# Patient Record
Sex: Male | Born: 1955
Health system: Southern US, Community
[De-identification: ages and names within clinical notes are randomized; demographics above are authoritative.]

## PROBLEM LIST (undated history)

## (undated) DIAGNOSIS — M222X9 Patellofemoral disorders, unspecified knee: Secondary | ICD-10-CM

## (undated) DIAGNOSIS — M1990 Unspecified osteoarthritis, unspecified site: Secondary | ICD-10-CM

## (undated) DIAGNOSIS — G4730 Sleep apnea, unspecified: Secondary | ICD-10-CM

## (undated) DIAGNOSIS — K219 Gastro-esophageal reflux disease without esophagitis: Secondary | ICD-10-CM

## (undated) DIAGNOSIS — Z8619 Personal history of other infectious and parasitic diseases: Secondary | ICD-10-CM

## (undated) DIAGNOSIS — I10 Essential (primary) hypertension: Secondary | ICD-10-CM

## (undated) HISTORY — DX: Essential (primary) hypertension: I10

## (undated) HISTORY — DX: Gastro-esophageal reflux disease without esophagitis: K21.9

## (undated) HISTORY — PX: NASAL SINUS SURGERY: SHX719

## (undated) HISTORY — DX: Patellofemoral disorders, unspecified knee: M22.2X9

## (undated) HISTORY — DX: Personal history of other infectious and parasitic diseases: Z86.19

## (undated) HISTORY — PX: OTHER SURGICAL HISTORY: SHX169

## (undated) HISTORY — DX: Unspecified osteoarthritis, unspecified site: M19.90

## (undated) HISTORY — DX: Sleep apnea, unspecified: G47.30

## (undated) HISTORY — PX: TONSILLECTOMY: SUR1361

---

## 1998-07-27 ENCOUNTER — Ambulatory Visit (HOSPITAL_COMMUNITY): Admission: RE | Admit: 1998-07-27 | Discharge: 1998-07-27 | Payer: Self-pay | Admitting: *Deleted

## 1999-01-07 ENCOUNTER — Encounter: Payer: Self-pay | Admitting: *Deleted

## 1999-01-07 ENCOUNTER — Ambulatory Visit (HOSPITAL_COMMUNITY): Admission: RE | Admit: 1999-01-07 | Discharge: 1999-01-07 | Payer: Self-pay | Admitting: *Deleted

## 1999-04-25 ENCOUNTER — Encounter: Admission: RE | Admit: 1999-04-25 | Discharge: 1999-07-24 | Payer: Self-pay | Admitting: *Deleted

## 2005-09-29 ENCOUNTER — Emergency Department (HOSPITAL_COMMUNITY): Admission: EM | Admit: 2005-09-29 | Discharge: 2005-09-29 | Payer: Self-pay | Admitting: Family Medicine

## 2008-12-14 ENCOUNTER — Ambulatory Visit: Payer: Self-pay | Admitting: Family Medicine

## 2008-12-14 DIAGNOSIS — E119 Type 2 diabetes mellitus without complications: Secondary | ICD-10-CM | POA: Insufficient documentation

## 2008-12-14 DIAGNOSIS — M79609 Pain in unspecified limb: Secondary | ICD-10-CM | POA: Insufficient documentation

## 2008-12-14 DIAGNOSIS — R5381 Other malaise: Secondary | ICD-10-CM

## 2008-12-14 DIAGNOSIS — R5383 Other fatigue: Secondary | ICD-10-CM

## 2008-12-14 DIAGNOSIS — K219 Gastro-esophageal reflux disease without esophagitis: Secondary | ICD-10-CM

## 2008-12-14 DIAGNOSIS — I10 Essential (primary) hypertension: Secondary | ICD-10-CM

## 2008-12-14 LAB — CONVERTED CEMR LAB
ALT: 29 units/L (ref 0–53)
AST: 24 units/L (ref 0–37)
Albumin: 4.2 g/dL (ref 3.5–5.2)
Alkaline Phosphatase: 66 units/L (ref 39–117)
BUN: 19 mg/dL (ref 6–23)
Basophils Absolute: 0 10*3/uL (ref 0.0–0.1)
Basophils Relative: 0.4 % (ref 0.0–3.0)
Bilirubin, Direct: 0.1 mg/dL (ref 0.0–0.3)
CO2: 28 meq/L (ref 19–32)
Calcium: 10.2 mg/dL (ref 8.4–10.5)
Chloride: 99 meq/L (ref 96–112)
Cholesterol: 162 mg/dL (ref 0–200)
Creatinine, Ser: 1.2 mg/dL (ref 0.4–1.5)
Eosinophils Absolute: 0.1 10*3/uL (ref 0.0–0.7)
Eosinophils Relative: 2 % (ref 0.0–5.0)
GFR calc Af Amer: 82 mL/min
GFR calc non Af Amer: 68 mL/min
Glucose, Bld: 157 mg/dL — ABNORMAL HIGH (ref 70–99)
HCT: 41.2 % (ref 39.0–52.0)
HDL: 31.2 mg/dL — ABNORMAL LOW (ref >39.0)
Hemoglobin: 14 g/dL (ref 13.0–17.0)
Hgb A1c MFr Bld: 8 % — ABNORMAL HIGH (ref 4.6–6.0)
LDL Cholesterol: 103 mg/dL — ABNORMAL HIGH (ref 0–99)
Lymphocytes Relative: 32 % (ref 12.0–46.0)
MCHC: 33.9 g/dL (ref 30.0–36.0)
MCV: 88.8 fL (ref 78.0–100.0)
Monocytes Absolute: 0.4 10*3/uL (ref 0.1–1.0)
Monocytes Relative: 8 % (ref 3.0–12.0)
Neutro Abs: 3.2 10*3/uL (ref 1.4–7.7)
Neutrophils Relative %: 57.6 % (ref 43.0–77.0)
Platelets: 248 10*3/uL (ref 150–400)
Potassium: 4.3 meq/L (ref 3.5–5.1)
RBC: 4.64 M/uL (ref 4.22–5.81)
RDW: 12.9 % (ref 11.5–14.6)
Sodium: 138 meq/L (ref 135–145)
TSH: 0.64 microintl units/mL (ref 0.35–5.50)
Total Bilirubin: 0.7 mg/dL (ref 0.3–1.2)
Total CHOL/HDL Ratio: 5.2
Total Protein: 7.5 g/dL (ref 6.0–8.3)
Triglycerides: 140 mg/dL (ref 0–149)
VLDL: 28 mg/dL (ref 0–40)
WBC: 5.5 10*3/uL (ref 4.5–10.5)

## 2008-12-16 ENCOUNTER — Encounter (INDEPENDENT_AMBULATORY_CARE_PROVIDER_SITE_OTHER): Payer: Self-pay | Admitting: *Deleted

## 2009-01-20 ENCOUNTER — Encounter: Payer: Self-pay | Admitting: Family Medicine

## 2009-01-21 ENCOUNTER — Telehealth: Payer: Self-pay | Admitting: Family Medicine

## 2009-02-15 ENCOUNTER — Telehealth: Payer: Self-pay | Admitting: Family Medicine

## 2009-09-10 ENCOUNTER — Encounter: Admission: RE | Admit: 2009-09-10 | Discharge: 2009-09-10 | Payer: Self-pay | Admitting: Orthopedic Surgery

## 2009-12-23 ENCOUNTER — Encounter: Admission: RE | Admit: 2009-12-23 | Discharge: 2009-12-23 | Payer: Self-pay | Admitting: Orthopedic Surgery

## 2010-06-07 ENCOUNTER — Encounter: Payer: Self-pay | Admitting: Family Medicine

## 2010-12-01 NOTE — Letter (Signed)
Summary: [facility]  [facility]   Imported By: Lanelle Bal 11/16/2010 10:53:20  _____________________________________________________________________  External Attachment:    Type:   Image     Comment:   External Document

## 2011-01-20 ENCOUNTER — Encounter: Payer: Self-pay | Admitting: Family Medicine

## 2011-01-23 ENCOUNTER — Ambulatory Visit (INDEPENDENT_AMBULATORY_CARE_PROVIDER_SITE_OTHER): Payer: Managed Care, Other (non HMO) | Admitting: Family Medicine

## 2011-01-23 ENCOUNTER — Encounter: Payer: Self-pay | Admitting: Family Medicine

## 2011-01-23 DIAGNOSIS — E119 Type 2 diabetes mellitus without complications: Secondary | ICD-10-CM

## 2011-01-23 DIAGNOSIS — E1140 Type 2 diabetes mellitus with diabetic neuropathy, unspecified: Secondary | ICD-10-CM | POA: Insufficient documentation

## 2011-01-23 DIAGNOSIS — E1149 Type 2 diabetes mellitus with other diabetic neurological complication: Secondary | ICD-10-CM

## 2011-01-23 DIAGNOSIS — E1142 Type 2 diabetes mellitus with diabetic polyneuropathy: Secondary | ICD-10-CM

## 2011-01-23 DIAGNOSIS — I10 Essential (primary) hypertension: Secondary | ICD-10-CM

## 2011-01-23 DIAGNOSIS — K219 Gastro-esophageal reflux disease without esophagitis: Secondary | ICD-10-CM

## 2011-01-23 MED ORDER — OMEPRAZOLE 20 MG PO CPDR: 20.0000 mg | DELAYED_RELEASE_CAPSULE | Freq: Every day | ORAL | Status: DC

## 2011-01-23 MED ORDER — GABAPENTIN 300 MG PO CAPS: 300.0000 mg | ORAL_CAPSULE | Freq: Three times a day (TID) | ORAL | Status: DC

## 2011-01-23 MED ORDER — METFORMIN HCL 500 MG PO TABS: 1000.0000 mg | ORAL_TABLET | Freq: Two times a day (BID) | ORAL | Status: DC

## 2011-01-23 MED ORDER — LISINOPRIL-HYDROCHLOROTHIAZIDE 20-25 MG PO TABS: 1.0000 | ORAL_TABLET | Freq: Every day | ORAL | Status: DC

## 2011-01-23 NOTE — Patient Instructions (Signed)
Gabapentin titration: 1 tablet at night for 3 days, then 1 tablet twice a day for 3 days, then start 1 tablet three times a day  Diabetic Neuropathy Diabetic neuropathy is a common complication caused by diabetes. Neuropathy is a term that means nerve disease or damage. If your diabetes is uncontrolled and you have high blood glucose (sugar) levels, over time, this can lead to damage to nerves throughout your body. There are three types of diabetic neuropathy:   Peripheral.   Autonomic.   Focal.  PERIPHERAL NEUROPATHY Peripheral neuropathy is the most common form of diabetic neuropathy. It causes damage to the nerves of the feet and legs and eventually the hands and arms.  SYMPTOMS Peripheral neuropathy occurs slowly over time. The peripheral nerves sense touch, hot and cold, and pain. When these nerves no longer work:   Your feet become numb.   You can no longer feel pressure or pain in your feet.   You may have burning, stabbing or aching pain.  This can lead to:  Thick calluses over pressure areas.   Pressure sores.   Ulcers. Ulcers can become infected with germs (bacteria) and can even lead to infection in the bones of the feet.  DIAGNOSIS The diagnosis of diabetic neuropathy is difficult at best. Sensory function testing can be done with:  Light touch using a monofilament.   Vibration with tuning fork.   Sharp sensation with pin prick  Other tests that can help diagnose neuropathy are:  Nerve Conduction Velocities (NCV). This checks the transmission of electrical current through a nerve.   Electromyography (EMG). This shows how muscles respond to electrical signals transmitted by nearby nerves.   Quantitative sensory testing, which is used to assess how your nerves respond to vibration and changes in temperature.  AUTONOMIC NEUROPATHY The autonomic nervous system controls functions that you do not think about. Examples would be:   Heart beat.   Regulation of body  temperature.   Blood pressure.   Urination.   Digestion.   Sweating.   Sexual function.  SYMPTOMS The symptoms of autonomic neuropathy vary depending on which nerves are affected.   There can be problems with digestion such as:   Feeling sick to your stomach (nausea).   Vomiting.   Bloating.   Constipation.   Diarrhea.   Abdominal pain.   Difficulty with urination may occur because of the inability to sense when your bladder is full. You may have urine leakage (incontinence) or inability to empty your bladder completely (retention).   Palpitations or a feeling of an abnormal heart beat.   Blood pressure drops on arising (orthostatic hypotension). This can happen when you first sit up or stand up. It causes you to feel:   Dizzy.   Weak.   Faint.   Sexual functioning:   In men, inability to attain and maintain an erection.   In women, vaginal dryness and problems with decreased sexual desire and arousal.  DIAGNOSIS Diagnosis is often based on reported symptoms. Tell your medical caregiver if you experience:   Dizziness.   Constipation.   Diarrhea.   Inappropriate urination or inability to urinate.   Inability to get or maintain an erection.  Tests that may be done include:  An EKG or Holter Monitor. These are tests that can help show problems with the heart rate or heart rhythm.   X-rays can be used to find if there are problems with your ability to properly empty food from your stomach into the  small intestine after eating.  FOCAL NEUROPATHY Focal neuropathy affects just one nerve tract and occurs suddenly. However, it usually improves by itself over time. It does not cause long term damage, and treatments are usually needed only until the problem improves. SYMPTOMS  Examples include:   Abnormal eye movements or abnormal alignment of both eyes.   Weakness in the wrist.   Foot drop, which results in inability to lift the foot properly. This causes  abnormal walking or foot movement.  DIAGNOSIS Diagnosis is made based on your symptoms and what your caregiver finds on your exam. Other tests that may be done include:  Nerve Conduction Velocities (NCV). This checks the transmission of electrical current through a nerve.   Electromyography (EMG). This shows how muscles respond to electrical signals transmitted by nearby nerves.   Quantitative sensory testing, which is used to assess how your nerves respond to vibration and changes in temperature.  TREATMENT OF NEUROPATHIES Once nerve damage occurs it cannot be reversed. The goal of treatment is to keep the disease from getting worse. If it gets worse, it will affect more nerve fibers. Controlling your blood (sugar) is the key. You will need to keep your blood glucose and A1c at the target range prescribed by your caregiver. Things that will help control blood glucose levels include:  Blood glucose monitoring.   Meal planning.   Physical activity.   Diabetes medication.  Over time, maintaining lower blood glucose levels helps lessen symptoms. Sometimes, prescription pain medicine is needed. Focal neuropathy can be painful and unpredictable and occurs most often in older adults with diabetes.  SEEK MEDICAL CARE IF:  You develop peripheral nerve symptoms such as burning, numbness, or pain in your feet, legs or hands.   You develop autonomic nerve symptoms such as:   Dizziness.   Abnormal urinary control.   Inability to get an erection.   You develop focal nerve symptoms such as sudden abnormal eye movements or sudden foot drop.  Document Released: 12/25/2001 Document Re-Released: 08/13/2009 [facility] Patient Information 2011 [place], [place].

## 2011-01-23 NOTE — Assessment & Plan Note (Signed)
I favor and discussed checking labs today.  He has had some blood work done in a study he is in  We will obtain records from the patient's prior physicians.

## 2011-01-23 NOTE — Assessment & Plan Note (Signed)
Refilled meds

## 2011-01-23 NOTE — Progress Notes (Signed)
55 year old male:  1. Diabetic neuropathy Numbness and tingling, in all extremiities Has been ongoing > 1 year Within the last year or more, has really been hurting more.  Has been more his feet as opposed to his hands.   2. DM, unclear of where is sugars have been Has been going to diabetic study.  2 pills twice a day of metformin  3. HTN, stable, on current HCTZ/ace dose and tolerating well  4. GERD, stable, tolerating chronic prilosec fine with no problems, occ heartburn  ROS: GEN: No acute illnesses, no fevers, chills. GI: No n/v/d, eating normally, o/w above Pulm: No SOB Interactive and getting along well at home.  Otherwise, ROS is as per the HPI.   The PMH, PSH, Social History, Family History, Medications, and allergies have been reviewed in [facility], and have been updated if relevant.   GEN: WDWN, NAD, Non-toxic, A & O x 3 HEENT: Atraumatic, Normocephalic. Neck supple. No masses, No LAD. Ears and Nose: No external deformity. CV: RRR, No M/G/R. No JVD. No thrill. No extra heart sounds. PULM: CTA B, no wheezes, crackles, rhonchi. No retractions. No resp. distress. No accessory muscle use. ABD: S, NT, ND, +BS. No rebound tenderness. No HSM.  Diabetic foot exam normal EXTR: No c/c/e NEURO Normal gait.  PSYCH: Normally interactive. Conversant. Not depressed or anxious appearing.  Calm demeanor.

## 2011-01-23 NOTE — Assessment & Plan Note (Signed)
Attempted to answer questions Refill prilosec

## 2011-01-23 NOTE — Assessment & Plan Note (Signed)
Refer to the patient instructions sections for details of plan shared with patient.  Start neurontin No medical care basically for 5 years

## 2011-01-26 ENCOUNTER — Encounter: Payer: Self-pay | Admitting: Family Medicine

## 2011-02-28 ENCOUNTER — Ambulatory Visit
Admission: RE | Admit: 2011-02-28 | Discharge: 2011-02-28 | Disposition: A | Discharge status: Home / Self Care | Payer: Managed Care, Other (non HMO) | Source: Ambulatory Visit | Attending: Orthopedic Surgery | Admitting: Orthopedic Surgery

## 2011-02-28 ENCOUNTER — Other Ambulatory Visit: Payer: Self-pay | Admitting: Orthopedic Surgery

## 2011-02-28 DIAGNOSIS — M542 Cervicalgia: Secondary | ICD-10-CM

## 2011-02-28 DIAGNOSIS — M25519 Pain in unspecified shoulder: Secondary | ICD-10-CM

## 2011-03-07 ENCOUNTER — Other Ambulatory Visit: Payer: Self-pay | Admitting: Orthopedic Surgery

## 2011-03-07 DIAGNOSIS — M542 Cervicalgia: Secondary | ICD-10-CM

## 2011-03-10 ENCOUNTER — Other Ambulatory Visit: Payer: Self-pay | Admitting: *Deleted

## 2011-03-10 MED ORDER — LISINOPRIL-HYDROCHLOROTHIAZIDE 20-25 MG PO TABS: 1.0000 | ORAL_TABLET | Freq: Every day | ORAL | Status: DC

## 2011-03-11 ENCOUNTER — Ambulatory Visit
Admission: RE | Admit: 2011-03-11 | Discharge: 2011-03-11 | Disposition: A | Discharge status: Home / Self Care | Payer: Managed Care, Other (non HMO) | Source: Ambulatory Visit | Attending: Orthopedic Surgery | Admitting: Orthopedic Surgery

## 2011-03-11 DIAGNOSIS — M542 Cervicalgia: Secondary | ICD-10-CM

## 2011-04-13 ENCOUNTER — Encounter: Payer: Managed Care, Other (non HMO) | Admitting: Family Medicine

## 2011-09-07 ENCOUNTER — Other Ambulatory Visit: Payer: Self-pay | Admitting: Family Medicine

## 2011-10-11 ENCOUNTER — Other Ambulatory Visit: Payer: Self-pay | Admitting: Specialist

## 2011-10-13 ENCOUNTER — Other Ambulatory Visit: Payer: Managed Care, Other (non HMO)

## 2011-10-18 ENCOUNTER — Ambulatory Visit
Admission: RE | Admit: 2011-10-18 | Discharge: 2011-10-18 | Disposition: A | Discharge status: Home / Self Care | Payer: Managed Care, Other (non HMO) | Source: Ambulatory Visit | Attending: Specialist | Admitting: Specialist

## 2012-01-26 ENCOUNTER — Other Ambulatory Visit: Payer: Self-pay | Admitting: Family Medicine

## 2012-01-26 ENCOUNTER — Ambulatory Visit
Admission: RE | Admit: 2012-01-26 | Discharge: 2012-01-26 | Disposition: A | Discharge status: Home / Self Care | Payer: Managed Care, Other (non HMO) | Source: Ambulatory Visit | Attending: Family Medicine | Admitting: Family Medicine

## 2012-01-26 DIAGNOSIS — R52 Pain, unspecified: Secondary | ICD-10-CM

## 2012-01-26 DIAGNOSIS — R609 Edema, unspecified: Secondary | ICD-10-CM

## 2012-02-08 ENCOUNTER — Other Ambulatory Visit: Payer: Self-pay | Admitting: Family Medicine

## 2012-08-26 ENCOUNTER — Other Ambulatory Visit: Payer: Self-pay | Admitting: Family Medicine

## 2012-10-04 ENCOUNTER — Other Ambulatory Visit: Payer: Self-pay | Admitting: Family Medicine

## 2012-10-15 ENCOUNTER — Other Ambulatory Visit: Payer: Self-pay | Admitting: Family Medicine

## 2013-08-05 ENCOUNTER — Ambulatory Visit: Payer: Self-pay | Admitting: Podiatry

## 2013-08-13 ENCOUNTER — Ambulatory Visit (INDEPENDENT_AMBULATORY_CARE_PROVIDER_SITE_OTHER): Payer: Managed Care, Other (non HMO) | Admitting: Podiatry

## 2013-08-13 ENCOUNTER — Encounter: Payer: Self-pay | Admitting: Podiatry

## 2013-08-13 VITALS — BP 137/79 | HR 68 | Ht 66.0 in | Wt 206.0 lb

## 2013-08-13 DIAGNOSIS — L609 Nail disorder, unspecified: Secondary | ICD-10-CM

## 2013-08-13 DIAGNOSIS — E1149 Type 2 diabetes mellitus with other diabetic neurological complication: Secondary | ICD-10-CM

## 2013-08-13 DIAGNOSIS — E1140 Type 2 diabetes mellitus with diabetic neuropathy, unspecified: Secondary | ICD-10-CM

## 2013-08-13 DIAGNOSIS — L608 Other nail disorders: Secondary | ICD-10-CM | POA: Insufficient documentation

## 2013-08-13 DIAGNOSIS — E1142 Type 2 diabetes mellitus with diabetic polyneuropathy: Secondary | ICD-10-CM

## 2013-08-13 NOTE — Progress Notes (Signed)
57 year old male patient presents requesting diabetic foot care. Blood sugar has been in low 200 (205-215) level. Concerned with deformed nail left great toe following a nail procedure. Feel abnormal numbness on bottom of foot left>right.  Review of Systems - General ROS: negative for - chills, fatigue, fever, night sweats, sleep disturbance or weight gain Ophthalmic ROS: Will get them checked soon. ENT ROS: negative Allergy and Immunology ROS: Been having throat irritation from weather. Respiratory ROS: no cough, shortness of breath, or wheezing Cardiovascular ROS: no chest pain or dyspnea on exertion Was told that his heart races even at night. Gastrointestinal ROS: no abdominal pain, change in bowel habits, or black or bloody stools Musculoskeletal ROS: Problem with lower back arthritis. Neurological ROS: positive for - numbness/tingling and on feet left > right. Dermatological ROS: negative.  Objective: Dermatologic: No open lesions or abnormal findings. Post traumatic nail deformity left hallux medial border. All other nails are normal in appearance. Orthopedic: Mostly rectus foot with a varus rotated right 5th toe. Vascular: All pedal pulses are palpable. No edema or erythema noted. Neurologic: Subjective numbness and tingling on left. Decreased response to Monofilament sensory testing on both feet. Normal DTR on lower limbs.  Assessment: NIDDM uncontrolled. Diabetic Neuropathy both feet. Deformed nail following surgical trauma left hallux, growing back normal.   Plan: Reviewed clinical findings and importance of maintaining normal blood sugar level. Deformed nail debrided.  Patient will work on lowering his blood sugar.  Patient will return as needed.

## 2013-08-13 NOTE — Patient Instructions (Signed)
Seen for deformed nail. It is growing back normal. Must be in good control with diabetes.  Return in 3 months or sooner as needed. Patient requests to be referred to an Endocrinologist.

## 2013-11-12 ENCOUNTER — Ambulatory Visit: Payer: Managed Care, Other (non HMO) | Admitting: Podiatry

## 2013-11-18 ENCOUNTER — Ambulatory Visit (INDEPENDENT_AMBULATORY_CARE_PROVIDER_SITE_OTHER): Payer: Managed Care, Other (non HMO) | Admitting: Podiatry

## 2013-11-18 ENCOUNTER — Encounter: Payer: Self-pay | Admitting: Podiatry

## 2013-11-18 VITALS — BP 143/72 | HR 75 | Ht 66.0 in | Wt 202.0 lb

## 2013-11-18 DIAGNOSIS — E1149 Type 2 diabetes mellitus with other diabetic neurological complication: Secondary | ICD-10-CM

## 2013-11-18 DIAGNOSIS — E1140 Type 2 diabetes mellitus with diabetic neuropathy, unspecified: Secondary | ICD-10-CM | POA: Insufficient documentation

## 2013-11-18 DIAGNOSIS — E1142 Type 2 diabetes mellitus with diabetic polyneuropathy: Secondary | ICD-10-CM

## 2013-11-18 NOTE — Patient Instructions (Signed)
Diabetic foot check.  Neuropathy in diabetes.  Blood sugar has not been checked regularly.  Previous blood sugar was high.  Referral made to Endocrinologist.

## 2013-11-18 NOTE — Progress Notes (Signed)
Subjective: 58 year old male presents stating that he still has tingling and numbness that is getting worse. It has been 3 months since he checked his blood sugar. It was 200 at that time.  Discussed that he should be seen by an endocrinologist during his last visit. He wants to be seen by one.  Objective: Pedal pulses are palpable. No edema or erythema noted. Subjective numbness and tingling on both feet and is getting worse. Mild hypertrophic nails x 10.    Assessment: Type II NIDDM not controlled. Diabetic neuropathy.  Plan: Discussed diabetic foot care and importance of lowering blood sugar.  Referral to endocrinologist to have better control of blood sugar.

## 2013-11-26 ENCOUNTER — Encounter: Payer: Self-pay | Admitting: Endocrinology

## 2013-11-26 ENCOUNTER — Ambulatory Visit (INDEPENDENT_AMBULATORY_CARE_PROVIDER_SITE_OTHER): Payer: Managed Care, Other (non HMO) | Admitting: Endocrinology

## 2013-11-26 VITALS — BP 130/76 | HR 86 | Temp 97.9°F | Ht 66.0 in | Wt 200.0 lb

## 2013-11-26 DIAGNOSIS — E119 Type 2 diabetes mellitus without complications: Secondary | ICD-10-CM

## 2013-11-26 LAB — HEMOGLOBIN A1C: Hgb A1c MFr Bld: 11.4 % — ABNORMAL HIGH (ref 4.6–6.5)

## 2013-11-26 MED ORDER — GLUCOSE BLOOD VI STRP: 1.0000 | ORAL_STRIP | Freq: Every day | Status: DC

## 2013-11-26 NOTE — Patient Instructions (Addendum)
good diet and exercise habits significanly improve the control of your diabetes.  please let me know if you wish to be referred to a dietician.  high blood sugar is very risky to your health.  you should see an eye doctor every year.  You are at higher than average risk for pneumonia and hepatitis-B.  You should be vaccinated against both.   controlling your blood pressure and cholesterol drastically reduces the damage diabetes does to your body.  this also applies to quitting smoking.  please discuss these with your doctor.  check your blood sugar once a day.  vary the time of day when you check, between before the 3 meals, and at bedtime.  also check if you have symptoms of your blood sugar being too high or too low.  please keep a record of the readings and bring it to your next appointment here.  You can write it on any piece of paper.  please call us sooner if your blood sugar goes below 70, or if you have a lot of readings over 200. Here is a new meter.  i have sent a prescription to your pharmacy, for strips.   blood tests are being requested for you today.  We'll contact you with results. If it is high, we'll add "invokana."   Please come back for a follow-up appointment in 1 month.

## 2013-11-26 NOTE — Progress Notes (Signed)
Subjective:    Patient ID: Randall Pace, male    DOB: Apr 30, 1956, 58 y.o.   MRN: [id number]  HPI pt states DM was dx'ed in 2004; he has moderate neuropathy of the lower extremities; he is unaware of any associated chronic complications.  he has never been on insulin.  pt says his diet and exercise are not very good.  He has pain and numbness of the feet.  He does not check cbg's.   Past Medical History  Diagnosis Date  . Diabetes mellitus   . Hypertension   . GERD (gastroesophageal reflux disease)   . Sleep apnea   . Arthritis     low back  . History of chicken pox   . Migraine   . Patellofemoral pain syndrome     Past Surgical History  Procedure Laterality Date  . Palatoplasts    . Uvoloplasty    . Nasal sinus surgery    . Tonsillectomy      History   Social History  . Marital Status: Married    Spouse Name: N/A    Number of Children: 2  . Years of Education: N/A   Occupational History  . 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 Smoking status: Never Smoker   . Smokeless tobacco: Never Used  . Alcohol Use: Yes  . Drug Use: No  . Sexual Activity: Not on file   Other Topics Concern  . Not on file   Social History Narrative  . No narrative on file    Current Outpatient Prescriptions on File Prior to Visit  Medication Sig Dispense Refill  . aspirin 81 MG tablet Take 81 mg by mouth daily.       Marland Kitchen atorvastatin (LIPITOR) 40 MG tablet Take 40 mg by mouth daily.       . Blood Glucose Monitoring Suppl (GLUCOMETER ELITE CLASSIC) KIT by Does not apply route.        . digoxin (LANOXIN) 0.25 MG tablet Take 0.25 mg by mouth daily.       Marland Kitchen glucose blood (GLUCOMETER ELITE TEST STRIPS) test strip 1 each by Other route daily. Use as instructed       . KOMBIGLYZE XR 2.02-999 MG TB24 Take by mouth 2 (two) times daily.       Marland Kitchen omeprazole (PRILOSEC) 20 MG capsule TAKE ONE CAPSULE BY MOUTH EVERY DAY  30 capsule  5  . tadalafil (CIALIS) 20 MG  tablet Take 20 mg by mouth daily as needed for erectile dysfunction.      . valsartan-hydrochlorothiazide (DIOVAN-HCT) 160-25 MG per tablet Take 1 tablet by mouth daily.       . verapamil (CALAN-SR) 240 MG CR tablet Take 240 mg by mouth daily.        No current facility-administered medications on file prior to visit.    No Known Allergies  Family History  Problem Relation Age of Onset  . Cancer Father     prostate    BP 130/76  Pulse 86  Temp(Src) 97.9 F (36.6 C) (Oral)  Ht $R'5\' 6"'jR$  (1.676 m)  Wt 200 lb (90.719 kg)  BMI 32.30 kg/m2  SpO2 97%  Review of Systems Denies blurry vision, headache, chest pain, n/v, cramps, excessive diaphoresis, memory loss, depression, cold intolerance, rhinorrhea, and easy bruising.  He has headaches (CT was recently neg).  He has lost 5 lbs x 6 weeks.  He has doe and urinary frequency.  Objective:   Physical Exam VS: see vs page GEN: no distress HEAD: head: no deformity eyes: no periorbital swelling, no proptosis external nose and ears are normal mouth: no lesion seen NECK: supple, thyroid is not enlarged CHEST WALL: no deformity LUNGS: clear to auscultation BREASTS:  No gynecomastia CV: reg rate and rhythm, no murmur ABD: abdomen is soft, nontender.  no hepatosplenomegaly.  not distended.  no hernia MUSCULOSKELETAL: muscle bulk and strength are grossly normal.  no obvious joint swelling.  gait is normal and steady PULSES: no carotid bruit NEURO:  cn 2-12 grossly intact.   readily moves all 4's.  SKIN:  Normal texture and temperature.  No rash or suspicious lesion is visible.   NODES:  None palpable at the neck PSYCH: alert, well-oriented.  Does not appear anxious nor depressed.   Lab Results  Component Value Date   HGBA1C 11.4* 11/26/2013      Assessment & Plan:  DM: very poor control.  He probably needs insulin.   Edema: this limits oral rx options. Neuropathy: this limits exercise rx of DM.

## 2013-11-27 MED ORDER — CANAGLIFLOZIN 300 MG PO TABS: 1.0000 | ORAL_TABLET | Freq: Every day | ORAL | Status: DC

## 2013-12-10 ENCOUNTER — Ambulatory Visit: Payer: Managed Care, Other (non HMO) | Admitting: Endocrinology

## 2013-12-10 DIAGNOSIS — Z0289 Encounter for other administrative examinations: Secondary | ICD-10-CM

## 2014-02-17 ENCOUNTER — Ambulatory Visit: Payer: Managed Care, Other (non HMO) | Admitting: Podiatry

## 2014-06-25 ENCOUNTER — Telehealth: Payer: Self-pay

## 2014-06-25 NOTE — Telephone Encounter (Signed)
Diabetic Bundle. Lvom for pt to call back and schedule appointment with Dr. Ellison.  

## 2014-09-25 DIAGNOSIS — M159 Polyosteoarthritis, unspecified: Secondary | ICD-10-CM | POA: Insufficient documentation

## 2014-09-25 DIAGNOSIS — I4891 Unspecified atrial fibrillation: Secondary | ICD-10-CM | POA: Insufficient documentation

## 2015-04-05 ENCOUNTER — Other Ambulatory Visit: Payer: Self-pay | Admitting: Orthopaedic Surgery

## 2015-04-05 DIAGNOSIS — M542 Cervicalgia: Secondary | ICD-10-CM

## 2015-04-05 DIAGNOSIS — M4722 Other spondylosis with radiculopathy, cervical region: Secondary | ICD-10-CM

## 2015-04-06 ENCOUNTER — Ambulatory Visit
Admission: RE | Admit: 2015-04-06 | Discharge: 2015-04-06 | Disposition: A | Discharge status: Home / Self Care | Payer: Managed Care, Other (non HMO) | Source: Ambulatory Visit | Attending: Orthopaedic Surgery | Admitting: Orthopaedic Surgery

## 2015-04-06 DIAGNOSIS — M542 Cervicalgia: Secondary | ICD-10-CM

## 2015-04-06 DIAGNOSIS — M4722 Other spondylosis with radiculopathy, cervical region: Secondary | ICD-10-CM

## 2015-06-23 ENCOUNTER — Emergency Department (HOSPITAL_COMMUNITY): Payer: Managed Care, Other (non HMO)

## 2015-06-23 ENCOUNTER — Emergency Department (HOSPITAL_COMMUNITY)
Admission: EM | Admit: 2015-06-23 | Discharge: 2015-06-24 | Disposition: A | Discharge status: Home / Self Care | Payer: Managed Care, Other (non HMO) | Attending: Emergency Medicine | Admitting: Emergency Medicine

## 2015-06-23 ENCOUNTER — Encounter (HOSPITAL_COMMUNITY): Payer: Self-pay | Admitting: Emergency Medicine

## 2015-06-23 DIAGNOSIS — Z8619 Personal history of other infectious and parasitic diseases: Secondary | ICD-10-CM | POA: Insufficient documentation

## 2015-06-23 DIAGNOSIS — Z79899 Other long term (current) drug therapy: Secondary | ICD-10-CM | POA: Insufficient documentation

## 2015-06-23 DIAGNOSIS — I10 Essential (primary) hypertension: Secondary | ICD-10-CM | POA: Insufficient documentation

## 2015-06-23 DIAGNOSIS — E119 Type 2 diabetes mellitus without complications: Secondary | ICD-10-CM | POA: Insufficient documentation

## 2015-06-23 DIAGNOSIS — M5412 Radiculopathy, cervical region: Secondary | ICD-10-CM

## 2015-06-23 DIAGNOSIS — G8918 Other acute postprocedural pain: Secondary | ICD-10-CM | POA: Diagnosis not present

## 2015-06-23 DIAGNOSIS — Z7982 Long term (current) use of aspirin: Secondary | ICD-10-CM | POA: Insufficient documentation

## 2015-06-23 DIAGNOSIS — R29898 Other symptoms and signs involving the musculoskeletal system: Secondary | ICD-10-CM

## 2015-06-23 DIAGNOSIS — K219 Gastro-esophageal reflux disease without esophagitis: Secondary | ICD-10-CM | POA: Insufficient documentation

## 2015-06-23 DIAGNOSIS — M1990 Unspecified osteoarthritis, unspecified site: Secondary | ICD-10-CM | POA: Diagnosis not present

## 2015-06-23 DIAGNOSIS — R531 Weakness: Secondary | ICD-10-CM | POA: Diagnosis present

## 2015-06-23 DIAGNOSIS — G43909 Migraine, unspecified, not intractable, without status migrainosus: Secondary | ICD-10-CM | POA: Diagnosis not present

## 2015-06-23 MED ORDER — HYDROMORPHONE HCL 1 MG/ML IJ SOLN
1.0000 mg | Freq: Once | INTRAMUSCULAR | Status: AC
  Administered 2015-06-23: 1 mg via INTRAVENOUS
  Filled 2015-06-23: qty 1

## 2015-06-23 MED ORDER — DIAZEPAM 5 MG/ML IJ SOLN
5.0000 mg | Freq: Once | INTRAMUSCULAR | Status: AC
  Administered 2015-06-23: 5 mg via INTRAVENOUS
  Filled 2015-06-23: qty 2

## 2015-06-23 MED ORDER — KETOROLAC TROMETHAMINE 15 MG/ML IJ SOLN
INTRAMUSCULAR | Status: AC
  Filled 2015-06-23: qty 1

## 2015-06-23 MED ORDER — ONDANSETRON HCL 4 MG/2ML IJ SOLN
4.0000 mg | Freq: Once | INTRAMUSCULAR | Status: AC
  Administered 2015-06-23: 4 mg via INTRAVENOUS
  Filled 2015-06-23: qty 2

## 2015-06-23 NOTE — ED Notes (Signed)
Pt reports laser neck surgery last Wednesday in [place]. Reports increased pain in neck, shoulders, bilateral arm weakness and numbness in fingertips since surgery. Pt moving all extremites. Denies recent fever. Alert, oriented, x4, dressing clean dry and intact.

## 2015-06-23 NOTE — ED Notes (Signed)
Patient given Ice chips per MD

## 2015-06-23 NOTE — ED Provider Notes (Signed)
CSN: [id number]     Arrival date & time 06/23/15  0907 History   First MD Initiated Contact with Patient 06/23/15 [phone]     Chief Complaint  Patient presents with  . Post-op Problem     (Consider location/radiation/quality/duration/timing/severity/associated sxs/prior Treatment) HPI Comments: Patient is a 59 year old male with a history of diabetes hypertension and cervical disc disease who went to [place] to a [facility] a proximally 1 week ago for surgery. The surgery was Wednesday he was reevaluated on Thursday in sent home. He had a 7 hour car ride home but did well until Friday when he started developing pain shooting from his neck down both arms which has led to gradual numbness and weakness. Now he is unable to do most things with his upper extremities due to weakness. He is unable to use his arms to assist in standing. He is unable to raise his arms directly over his head due to weakness and pain. He's been taking Valium and Percocet with only minimally relief of the pain.  He denies fever, any bleeding from the incision site. No complaints of issues with his leg strength.  The history is provided by the patient and the spouse.    Past Medical History  Diagnosis Date  . Diabetes mellitus   . Hypertension   . GERD (gastroesophageal reflux disease)   . Sleep apnea   . Arthritis     low back  . History of chicken pox   . Migraine   . Patellofemoral pain syndrome    Past Surgical History  Procedure Laterality Date  . Palatoplasts    . Uvoloplasty    . Nasal sinus surgery    . Tonsillectomy     Family History  Problem Relation Age of Onset  . Cancer Father     prostate   Social History  Substance Use Topics  . Smoking status: Never Smoker   . Smokeless tobacco: Never Used  . Alcohol Use: Yes    Review of Systems  All other systems reviewed and are negative.     Allergies  Review of patient's allergies indicates no known allergies.  Home Medications    Prior to Admission medications   Medication Sig Start Date End Date Taking? Authorizing Provider  aspirin 81 MG tablet Take 81 mg by mouth daily.     Historical Provider, MD  atorvastatin (LIPITOR) 40 MG tablet Take 40 mg by mouth daily.  10/28/13   Historical Provider, MD  Blood Glucose Monitoring Suppl (GLUCOMETER ELITE CLASSIC) KIT by Does not apply route.      Historical Provider, MD  Canagliflozin (INVOKANA) 300 MG TABS Take 1 tablet (300 mg total) by mouth daily. 11/27/13   Renato Shin, MD  digoxin (LANOXIN) 0.25 MG tablet Take 0.25 mg by mouth daily.  08/05/13   Historical Provider, MD  glucose blood (ONETOUCH VERIO) test strip 1 each by Other route daily. And lancets 1/day 250.00 11/26/13   Renato Shin, MD  KOMBIGLYZE XR 2.02-999 MG TB24 Take by mouth 2 (two) times daily.  08/03/13   Historical Provider, MD  omeprazole (PRILOSEC) 20 MG capsule TAKE ONE CAPSULE BY MOUTH EVERY DAY 10/15/12   Amy Cletis Athens, MD  tadalafil (CIALIS) 20 MG tablet Take 20 mg by mouth daily as needed for erectile dysfunction.    Historical Provider, MD  valsartan-hydrochlorothiazide (DIOVAN-HCT) 160-25 MG per tablet Take 1 tablet by mouth daily.  08/12/13   Historical Provider, MD  verapamil (CALAN-SR) 240 MG CR  tablet Take 240 mg by mouth daily.  07/29/13   Historical Provider, MD   BP 141/92 mmHg  Pulse 102  Temp(Src) 98.7 F (37.1 C) (Oral)  Resp 16  SpO2 95% Physical Exam  Constitutional: He is oriented to person, place, and time. He appears well-developed and well-nourished. No distress.  HENT:  Head: Normocephalic and atraumatic.  Mouth/Throat: Oropharynx is clear and moist.  Eyes: Conjunctivae and EOM are normal. Pupils are equal, round, and reactive to light.  Neck: Normal range of motion. Neck supple.  Cardiovascular: Normal rate, regular rhythm and intact distal pulses.   No murmur heard. Pulmonary/Chest: Effort normal and breath sounds normal. No respiratory distress. He has no wheezes. He has  no rales.  Abdominal: Soft. He exhibits no distension. There is no tenderness. There is no rebound and no guarding.  Musculoskeletal: Normal range of motion. He exhibits tenderness. He exhibits no edema.  Neurological: He is alert and oriented to person, place, and time.  4 out of 5 hand grip strength bilaterally. 3 out of 5 strength in the shoulders bilaterally. Decreased sensation in the C6 and 7 distribution.  5 out of 5 strength in bilateral lower extremities and normal sensation in the lower extremities  Skin: Skin is warm and dry. No rash noted. No erythema.  Psychiatric: He has a normal mood and affect. His behavior is normal.  Nursing note and vitals reviewed.   ED Course  Procedures (including critical care time) Labs Review Labs Reviewed - No data to display  Imaging Review Ct Cervical Spine Wo Contrast  06/23/2015   CLINICAL DATA:  Pt reports laser neck surgery last Wednesday in [place]. Reports increased pain in neck, shoulders, bilateral arm weakness and numbness in fingertips since surgery  EXAM: CT CERVICAL SPINE WITHOUT CONTRAST  TECHNIQUE: Multidetector CT imaging of the cervical spine was performed without intravenous contrast. Multiplanar CT image reconstructions were also generated.  COMPARISON:  MRI 06/23/2015, plain films 02/28/2011, MRI 04/06/2015  FINDINGS: There is reversal of the normal cervical lordosis. No prevertebral soft tissue swelling or gas. There is mild endplate spurring and joint space narrowing from C4 through C7 unchanged. There is fusion of the facet joints from C4 through C7 bilaterally. Bilateral neural foraminal stenosis at C6-C7. No evidence epidural paraspinal hematoma.  IMPRESSION: 1. No acute findings of the cervical spine. 2. Stable postoperative change consistent with posterior spinal fusion. 3. Bilateral neural foraminal stenosis at C6-C7.   Electronically Signed   By: Suzy Bouchard M.D.   On: 06/23/2015 15:21   Mr Cervical Spine Wo  Contrast  06/23/2015   CLINICAL DATA:  Upper extremity weakness. Neck surgery 1 week ago in [place].  EXAM: MRI CERVICAL SPINE WITHOUT CONTRAST  TECHNIQUE: Multiplanar, multisequence MR imaging of the cervical spine was performed. No intravenous contrast was administered.  COMPARISON:  04/06/2015  FINDINGS: No marrow signal abnormality suggestive of fracture, infection, or neoplasm.  Bilateral lateral mass screws and rods at C5-6 and C6-7 with expected edema and the paraspinous soft tissues. There is a tiny retropharyngeal fluid collection measuring 1-2 mm thickness at the C3 and C4 levels.  No evidence of spinal canal collection. Normal cord signal and morphology.  .  Degenerative changes:  C2-3: Unremarkable.  C3-4: Unremarkable.  C4-5: Bilateral uncovertebral spurring, greater to the left. Moderate left foraminal stenosis appears unchanged, as permitted by artifact, heavily relying on sagittal. Diminished annulus bulging or ligamentous thickening.  C5-6: Posterior disc osteophyte complex is less prominent. Bilateral uncovertebral spurs with  stable open foramina.  C6-7: Posterior disc osteophyte complex is diminished. Hyperintense signal within the posterior aspect of the disc is stable, with Schmorl's node through the upper endplate at C7. Advanced bilateral uncovertebral spurring is stable, with bilateral C7 impingement.  C7-T1:Unremarkable.  These results were called by telephone at the time of interpretation on 06/23/2015 at 12:47 pm to Dr. Blanchie Dessert , who verbally acknowledged these results.  IMPRESSION: 1. Thin retropharyngeal fluid collection at the C3 and C4 levels, sterility indeterminate. 2. Re-evaluation of the lower cervical foramina is limited by new hardware. C6-7 advanced biforaminal stenosis with C7 impingement is stable from 2 months prior. 3. No spinal canal collection or cord signal abnormality.   Electronically Signed   By: Monte Fantasia M.D.   On: 06/23/2015 12:47   I have personally  reviewed and evaluated these images and lab results as part of my medical decision-making.   EKG Interpretation None      MDM   Final diagnoses:  Post-op pain  Cervical radiculopathy at C6  Upper extremity weakness    Patient with recent laser spine surgery in [place] one week ago who presents today with worsening bilateral upper extremity weakness over the last 5 days with numbness in the C6-7 distribution.  Patient spoke with the surgeon in [place] who recommended a come to the ER for evaluation of his developing weakness and get an MRI for further evaluation.  On exam patient has bilateral 4 out of 5 strength in the upper extremities with decreased sensation in the C6 and C7 distribution. Weakness most pronounced in the shoulders but also weakness in hand grip bilaterally. 2+ radial pulses. Incision is healing well without signs of infection. He denies any infectious etiology concerning for a developing abscess. Concern for possible hematoma versus swelling and cord compression.  MRI pending  1:23 PM MRI shows a thin retropharyngeal fluid collection at C3 and 4 and persistent C6-7 by foraminal stenosis with C7 impingement which is stable from 2 months ago. No spinal cord abnormality at this time. Will attempt to discuss this with the surgeon from the [facility] in [place].  2:00 PM Spoke with Dr Raliegh Ip from the spine [facility] and he recommended CT to eval hardware location and if normal to continue pain control, lyrica and give it time.  Ct showed no evidence of hardware malfunction and otherwise normal CT.  Pt still having ongoing pain despite dilaudid and valium.  Given some toradol.   Pt started on medrol dosepack, increased lyrica to 117m tid and told to increase valium to [id number] and to follow up with his neurosurgeon in [place] and keep in close contact with Dr. K Raliegh Ipn ohTowanda WhBlanchie DessertMD 06/23/15 [phone]

## 2016-03-30 DIAGNOSIS — B351 Tinea unguium: Secondary | ICD-10-CM | POA: Insufficient documentation

## 2016-04-20 LAB — HM COLONOSCOPY

## 2016-08-01 DIAGNOSIS — L03032 Cellulitis of left toe: Secondary | ICD-10-CM | POA: Insufficient documentation

## 2017-11-11 ENCOUNTER — Other Ambulatory Visit: Payer: Self-pay

## 2017-11-11 ENCOUNTER — Ambulatory Visit (HOSPITAL_COMMUNITY)
Admission: EM | Admit: 2017-11-11 | Discharge: 2017-11-11 | Disposition: A | Discharge status: Home / Self Care | Payer: Managed Care, Other (non HMO) | Attending: Family Medicine | Admitting: Family Medicine

## 2017-11-11 ENCOUNTER — Encounter (HOSPITAL_COMMUNITY): Payer: Self-pay | Admitting: *Deleted

## 2017-11-11 DIAGNOSIS — K429 Umbilical hernia without obstruction or gangrene: Secondary | ICD-10-CM | POA: Diagnosis not present

## 2017-11-11 MED ORDER — POLYETHYLENE GLYCOL 3350 17 GM/SCOOP PO POWD: 1.0000 | Freq: Once | ORAL | 0 refills | Status: AC

## 2017-11-11 NOTE — Discharge Instructions (Signed)
Try a lumbar support that supports the abdomen  Losing weight will help.  Best approach is limiting carbohydrates:  rice, potatoe, bread and pasta.

## 2017-11-11 NOTE — ED Triage Notes (Signed)
Pt describes umbilical hernia - "I had an innie & now I have an outie belly button".  States noticed several months ago, but now having increase pain, and constipation, "hard stomach", bloating.  States able to reduce umbilical bulge, but is slightly uncomfortable to do so.

## 2017-11-11 NOTE — ED Provider Notes (Signed)
[facility]   [id number] 11/11/17 Arrival Time: 1915   SUBJECTIVE:  Randall Pace is a 62 y.o. male who presents to the urgent care with complaint of umbilical hernia that has become mildly tender and associated with constipation.  He also has some right perineal pain with bearing down, but no swelling.  Nondrinker.     Past Medical History:  Diagnosis Date  . Arthritis    low back  . Diabetes mellitus   . GERD (gastroesophageal reflux disease)   . History of chicken pox   . Hypertension   . Migraine   . Patellofemoral pain syndrome   . Sleep apnea    Family History  Problem Relation Age of Onset  . Cancer Father        prostate   Social History   Socioeconomic History  . Marital status: Married    Spouse name: Not on file  . Number of children: 2  . Years of education: Not on file  . Highest education level: Not on file  Social Needs  . Financial resource strain: Not on file  . Food insecurity - worry: Not on file  . Food insecurity - inability: Not on file  . Transportation needs - medical: Not on file  . Transportation needs - non-medical: Not on file  Occupational History  . Occupation: Psychologist, forensicbuilding manager/mucisian, [employer]  Tobacco Use  . Smoking status: Never Smoker  . Smokeless tobacco: Never Used  Substance and Sexual Activity  . Alcohol use: Yes  . Drug use: No  . Sexual activity: Not on file  Other Topics Concern  . Not on file  Social History Narrative  . Not on file   Current Meds  Medication Sig  . aspirin 81 MG tablet Take 81 mg by mouth daily.   Marland Kitchen. atorvastatin (LIPITOR) 40 MG tablet Take 40 mg by mouth daily.   Marland Kitchen. KOMBIGLYZE XR 2.02-999 MG TB24 Take by mouth 2 (two) times daily.   Marland Kitchen. omeprazole (PRILOSEC) 20 MG capsule TAKE ONE CAPSULE BY MOUTH EVERY DAY  . valsartan-hydrochlorothiazide (DIOVAN-HCT) 160-25 MG per tablet Take 1 tablet by mouth daily.    No Known Allergies    ROS: As per HPI, remainder of ROS  negative.   OBJECTIVE:   Vitals:   11/11/17 1940  BP: (!) 141/90  Pulse: 96  Resp: 20  Temp: 98.5 F (36.9 C)  TempSrc: Oral  SpO2: 96%     General appearance: alert; no distress Eyes: PERRL; EOMI; conjunctiva normal HENT: normocephalic; atraumatic;  Neck: supple Abdomen: soft, mildly tender umbilical hernia; bowel sounds normal; no masses or organomegaly; no guarding or rebound tenderness; no inguinal hernia felt. Back: no CVA tenderness Extremities: no cyanosis or edema; symmetrical with no gross deformities Skin: warm and dry Neurologic: normal gait; grossly normal Psychological: alert and cooperative; normal mood and affect      Labs:  Results for orders placed or performed in visit on 11/26/13  Hemoglobin A1c  Result Value Ref Range   Hgb A1c MFr Bld 11.4 (H) 4.6 - 6.5 %    Labs Reviewed - No data to display  No results found.     ASSESSMENT & PLAN:  1. Umbilical hernia without obstruction and without gangrene     Meds ordered this encounter  Medications  . polyethylene glycol powder (MIRALAX) powder    Sig: Take 255 g by mouth once for 1 dose.    Dispense:  255 g    Refill:  0  weight loss encouraged.  Reviewed expectations re: course of current medical issues. Questions answered. Outlined signs and symptoms indicating need for more acute intervention. Patient verbalized understanding. After Visit Summary given.      Elvina Sidle, MD 11/11/17 2007

## 2017-11-19 ENCOUNTER — Ambulatory Visit: Payer: Self-pay | Admitting: Surgery

## 2017-11-19 NOTE — H&P (Signed)
History of Present Illness Randall Pace(Randall Pace K. Albi Rappaport MD; 11/19/2017 12:12 PM) The patient is a 62 year old male who presents with an umbilical hernia. Referred by Dr. Milus GlazierLauenstein for umbilical hernia  This is a 62 year old male who presents with a six-month history of an enlarging bulge at his umbilicus. This is associated with some discomfort. The patient has noticed more constipation over the last year or so. It is unclear whether this is related. The hernia remains reducible. The patient has had a rectus diastases for several years but this feels different. He has not had any imaging of this area. He presents now to discuss surgical repair of his hernia. The patient works in [employer] but does not do any heavy lifting.   Allergies Judithann Sauger(Patricia King, [place]MA; 11/19/2017 10:18 AM) No Known Drug Allergies [11/19/2017]: Allergies Reconciled  Medication History Judithann Sauger(Patricia King, [place]MA; 11/19/2017 10:20 AM) Verapamil HCl ER (180MG  Tablet ER, Oral) Active. Valsartan-Hydrochlorothiazide (160-25MG  Tablet, Oral) Active. Omeprazole (20MG  Capsule DR, Oral) Active. MetFORMIN HCl (1000MG  Tablet, Oral) Active. GlipiZIDE (10MG  Tablet, Oral) Active. Atorvastatin Calcium (40MG  Tablet, Oral) Active. Invokamet (150-1000MG  Tablet, Oral) Active. Lipitor (40MG  Tablet, Oral) Active. Tadalafil (20MG  Tablet, Oral) Active. Aspirin (81MG  Tablet DR, Oral) Active. Medications Reconciled    Vitals Judithann Sauger(Patricia King RMA; 11/19/2017 10:18 AM) 11/19/2017 10:17 AM Weight: 212.2 lb Height: 66in Body Surface Area: 2.05 m Body Mass Index: 34.25 kg/m  Temp.: 98.89F  Pulse: 111 (Regular)  BP: 150/90 (Sitting, Left Arm, Standard)      Physical Exam Molli Hazard(Mirabel Ahlgren K. Wenonah Milo MD; 11/19/2017 12:12 PM)  The physical exam findings are as follows: Note:WDWN in NAD Eyes: Pupils equal, round; sclera anicteric HENT: Oral mucosa moist; good dentition Neck: No masses palpated, no thyromegaly Lungs: CTA bilaterally; normal  respiratory effort CV: Regular rate and rhythm; no murmurs; extremities well-perfused with no edema Abd: +bowel sounds, soft, non-tender, no palpable organomegaly; upper midline rectus diastasis Small protruding umbilical hernia - reducible; 1 cm defect Skin: Warm, dry; no sign of jaundice Psychiatric - alert and oriented x 4; calm mood and affect    Assessment & Plan Molli Hazard(Cantrell Martus K. Akoni Parton MD; 11/19/2017 11:02 AM)  UMBILICAL HERNIA WITHOUT OBSTRUCTION OR GANGRENE (K42.9)  Current Plans Schedule for Surgery - Umbilical hernia repair with mesh. The surgical procedure has been discussed with the patient. Potential risks, benefits, alternative treatments, and expected outcomes have been explained. All of the patient's questions at this time have been answered. The likelihood of reaching the patient's treatment goal is good. The patient understand the proposed surgical procedure and wishes to proceed.  Randall ArmsMatthew K. Corliss Skainssuei, MD, [facility]  General/ Trauma Surgery  11/19/2017  12:13 PM

## 2018-01-31 ENCOUNTER — Encounter: Payer: Self-pay | Admitting: Internal Medicine

## 2018-01-31 ENCOUNTER — Ambulatory Visit: Payer: Managed Care, Other (non HMO) | Admitting: Internal Medicine

## 2018-01-31 ENCOUNTER — Ambulatory Visit (INDEPENDENT_AMBULATORY_CARE_PROVIDER_SITE_OTHER): Payer: Managed Care, Other (non HMO) | Admitting: Internal Medicine

## 2018-01-31 ENCOUNTER — Encounter (INDEPENDENT_AMBULATORY_CARE_PROVIDER_SITE_OTHER): Payer: Self-pay

## 2018-01-31 VITALS — BP 152/94 | HR 90 | Temp 97.9°F | Wt 214.0 lb

## 2018-01-31 DIAGNOSIS — L2089 Other atopic dermatitis: Secondary | ICD-10-CM | POA: Diagnosis not present

## 2018-01-31 MED ORDER — TRIAMCINOLONE ACETONIDE 0.5 % EX OINT: 1.0000 "application " | TOPICAL_OINTMENT | Freq: Two times a day (BID) | CUTANEOUS | 0 refills | Status: DC

## 2018-01-31 NOTE — Progress Notes (Signed)
Subjective:    Patient ID: Randall Pace, male    DOB: 05/23/1956, 62 y.o.   MRN: [id number]  HPI  Pt presents to the clinic today with c/o a rash to the right side of his abdomen. He noticed this about 3 weeks ago. The rash itches. It does not burn or tingle. It has not spread. He denies changes in soaps, medications, lotions or detergents. He has tried OTC steroid cream with minimal relief. No one in his home has a similar rash.  Review of Systems      Past Medical History:  Diagnosis Date  . Arthritis    low back  . Diabetes mellitus   . GERD (gastroesophageal reflux disease)   . History of chicken pox   . Hypertension   . Migraine   . Patellofemoral pain syndrome   . Sleep apnea     Current Outpatient Medications  Medication Sig Dispense Refill  . aspirin 81 MG tablet Take 81 mg by mouth daily.     Marland Kitchen atorvastatin (LIPITOR) 40 MG tablet Take 40 mg by mouth daily.    Marland Kitchen glipiZIDE (GLUCOTROL) 10 MG tablet Take 10 mg by mouth daily before breakfast.    . metFORMIN (GLUCOPHAGE) 1000 MG tablet Take 1,000 mg by mouth 2 (two) times daily with a meal.    . omeprazole (PRILOSEC) 20 MG capsule TAKE ONE CAPSULE BY MOUTH EVERY DAY 30 capsule 5  . OVER THE COUNTER MEDICATION     . verapamil (CALAN-SR) 180 MG CR tablet Take 180 mg by mouth at bedtime.    . triamcinolone ointment (KENALOG) 0.5 % Apply 1 application topically 2 (two) times daily. 30 g 0  . valsartan-hydrochlorothiazide (DIOVAN-HCT) 160-25 MG per tablet Take 1 tablet by mouth daily.      No current facility-administered medications for this visit.     No Known Allergies  Family History  Problem Relation Age of Onset  . Cancer Father        prostate    Social History   Socioeconomic History  . Marital status: Married    Spouse name: Not on file  . Number of children: 2  . Years of education: Not on file  . Highest education level: Not on file  Occupational History  . Occupation: Psychologist, forensic,  [employer]  Social Needs  . Financial resource strain: Not on file  . Food insecurity:    Worry: Not on file    Inability: Not on file  . Transportation needs:    Medical: Not on file    Non-medical: Not on file  Tobacco Use  . Smoking status: Never Smoker  . Smokeless tobacco: Never Used  Substance and Sexual Activity  . Alcohol use: Yes    Comment: occasional  . Drug use: No  . Sexual activity: Not on file  Lifestyle  . Physical activity:    Days per week: Not on file    Minutes per session: Not on file  . Stress: Not on file  Relationships  . Social connections:    Talks on phone: Not on file    Gets together: Not on file    Attends religious service: Not on file    Active member of club or organization: Not on file    Attends meetings of clubs or organizations: Not on file    Relationship status: Not on file  . Intimate partner violence:    Fear of current or ex partner: Not on file  Emotionally abused: Not on file    Physically abused: Not on file    Forced sexual activity: Not on file  Other Topics Concern  . Not on file  Social History Narrative  . Not on file     Constitutional: Denies fever, malaise, fatigue, headache or abrupt weight changes.  Skin: Pt reports rash. Denies redness, or ulcercations.    No other specific complaints in a complete review of systems (except as listed in HPI above).  Objective:   Physical Exam   BP (!) 152/94   Pulse 90   Temp 97.9 F (36.6 C) (Oral)   Wt 214 lb (97.1 kg)   SpO2 98%   BMI 34.54 kg/m  Wt Readings from Last 3 Encounters:  01/31/18 214 lb (97.1 kg)  04/06/15 195 lb (88.5 kg)  11/26/13 200 lb (90.7 kg)    General: Appears his stated age, obese in NAD. Skin: 4 cm by 3 cm patch of rased maculopapular lesions consistent with eczema. Cardiovascular: Normal rate and rhythm.  Pulmonary/Chest: Normal effort and positive vesicular breath sounds. No respiratory distress. No wheezes, rales or ronchi  noted.  Neurological: Alert and oriented.  Psychiatric: Mood and affect normal. Behavior is normal. Judgment and thought content normal.    BMET    Component Value Date/Time   NA 138 12/14/2008 1023   K 4.3 12/14/2008 1023   CL 99 12/14/2008 1023   CO2 28 12/14/2008 1023   GLUCOSE 157 (H) 12/14/2008 1023   BUN 19 12/14/2008 1023   CREATININE 1.2 12/14/2008 1023   CALCIUM 10.2 12/14/2008 1023   GFRNONAA 68 12/14/2008 1023   GFRAA 82 12/14/2008 1023    Lipid Panel     Component Value Date/Time   CHOL 162 12/14/2008 1023   TRIG 140 12/14/2008 1023   HDL 31.2 (L) 12/14/2008 1023   CHOLHDL 5.2 CALC 12/14/2008 1023   VLDL 28 12/14/2008 1023   LDLCALC 103 (H) 12/14/2008 1023    CBC    Component Value Date/Time   WBC 5.5 12/14/2008 1023   RBC 4.64 12/14/2008 1023   HGB 14.0 12/14/2008 1023   HCT 41.2 12/14/2008 1023   PLT 248 12/14/2008 1023   MCV 88.8 12/14/2008 1023   MCHC 33.9 12/14/2008 1023   RDW 12.9 12/14/2008 1023   MONOABS 0.4 12/14/2008 1023   EOSABS 0.1 12/14/2008 1023   BASOSABS 0.0 12/14/2008 1023    Hgb A1C Lab Results  Component Value Date   HGBA1C 11.4 (H) 11/26/2013           Assessment & Plan:   Atopic Dermatitis:  Avoid hot showers Use a moisturizing lotion 2 x day eRx for Triamcinolone Ointment 0.5% BID until resolved  Return precautions discussed Nicki ReaperBAITY, REGINA, NP

## 2018-01-31 NOTE — Patient Instructions (Signed)

## 2018-02-01 ENCOUNTER — Other Ambulatory Visit: Payer: Self-pay

## 2018-02-01 NOTE — Telephone Encounter (Signed)
Spoke to pt and let him know that Sampson SiBaity said that he will have to wait until his NP appt to discuss med refill for Cialis... Pt states him and his wife can not last that long and he has been taking Cialis 20mg  taking 1/2 tab--- [facility] [phone]... Pt want Nicki Reaperegina Baity to call previous PCP and pharmacist... Advised Rene KocherRegina is not in the office and most likely either myself or another employee will call back... Please advise

## 2018-02-01 NOTE — Telephone Encounter (Signed)
I saw him for an acute visit prior to his NP appt. It is not appropriate for me to be filling meds like that prior to his NP appt.

## 2018-02-12 ENCOUNTER — Telehealth: Payer: Self-pay

## 2018-02-12 LAB — HM DIABETES EYE EXAM

## 2018-02-12 NOTE — Telephone Encounter (Signed)
Pt walked in; pt seen at eye doctor today and was told to see dr to get his BP meds refilled; today BP at eye dr was 158/84.pt has new pt appt on 02/25/18 with Pamala Hurry Baity NP. Pt was seen for rash on 01/31/18. I explained since was not established with R Baity NP could not refill BP med until established. Advised pt to ck with previous dr to see if could get BP meds refilled there. Pt said no because billing dispute; Dr Quita SkyeMitchell's office said pt owes $1000.00 and pt said he does not owe it and will not pay it. [facility] and would need to [facility] to [facility]. No CP,SOB or dizziness; pt had H/A on 02/11/18. Pt said he has been out of BP meds for 1 week.I spoke with Angelica ChessmanMandy RN and she advised today pt would need to go to UC. Pt states he will go to UC today. FYI to Pamala Hurry Baity NP.

## 2018-02-13 NOTE — Telephone Encounter (Signed)
noted 

## 2018-02-18 ENCOUNTER — Encounter: Payer: Self-pay | Admitting: Internal Medicine

## 2018-02-25 ENCOUNTER — Ambulatory Visit (INDEPENDENT_AMBULATORY_CARE_PROVIDER_SITE_OTHER): Payer: Managed Care, Other (non HMO) | Admitting: Internal Medicine

## 2018-02-25 ENCOUNTER — Encounter

## 2018-02-25 ENCOUNTER — Encounter: Payer: Self-pay | Admitting: Internal Medicine

## 2018-02-25 VITALS — BP 162/94 | HR 100 | Temp 98.1°F | Ht 66.0 in | Wt 212.0 lb

## 2018-02-25 DIAGNOSIS — E1140 Type 2 diabetes mellitus with diabetic neuropathy, unspecified: Secondary | ICD-10-CM | POA: Diagnosis not present

## 2018-02-25 DIAGNOSIS — I10 Essential (primary) hypertension: Secondary | ICD-10-CM

## 2018-02-25 DIAGNOSIS — K219 Gastro-esophageal reflux disease without esophagitis: Secondary | ICD-10-CM

## 2018-02-25 DIAGNOSIS — E119 Type 2 diabetes mellitus without complications: Secondary | ICD-10-CM | POA: Diagnosis not present

## 2018-02-25 DIAGNOSIS — N529 Male erectile dysfunction, unspecified: Secondary | ICD-10-CM | POA: Diagnosis not present

## 2018-02-25 DIAGNOSIS — E785 Hyperlipidemia, unspecified: Secondary | ICD-10-CM | POA: Insufficient documentation

## 2018-02-25 DIAGNOSIS — E7800 Pure hypercholesterolemia, unspecified: Secondary | ICD-10-CM

## 2018-02-25 DIAGNOSIS — G4733 Obstructive sleep apnea (adult) (pediatric): Secondary | ICD-10-CM | POA: Diagnosis not present

## 2018-02-25 DIAGNOSIS — E1169 Type 2 diabetes mellitus with other specified complication: Secondary | ICD-10-CM | POA: Insufficient documentation

## 2018-02-25 DIAGNOSIS — M1990 Unspecified osteoarthritis, unspecified site: Secondary | ICD-10-CM

## 2018-02-25 MED ORDER — VERAPAMIL HCL ER 180 MG PO TBCR: 180.0000 mg | EXTENDED_RELEASE_TABLET | Freq: Every day | ORAL | 3 refills | Status: DC

## 2018-02-25 MED ORDER — METFORMIN HCL 1000 MG PO TABS: 1000.0000 mg | ORAL_TABLET | Freq: Two times a day (BID) | ORAL | 3 refills | Status: DC

## 2018-02-25 MED ORDER — OMEPRAZOLE 20 MG PO CPDR: 20.0000 mg | DELAYED_RELEASE_CAPSULE | Freq: Every day | ORAL | 3 refills | Status: DC

## 2018-02-25 MED ORDER — VALSARTAN-HYDROCHLOROTHIAZIDE 160-25 MG PO TABS: 1.0000 | ORAL_TABLET | Freq: Every day | ORAL | 3 refills | Status: DC

## 2018-02-25 MED ORDER — GLIPIZIDE 10 MG PO TABS: 10.0000 mg | ORAL_TABLET | Freq: Every day | ORAL | 3 refills | Status: DC

## 2018-02-25 MED ORDER — ATORVASTATIN CALCIUM 40 MG PO TABS: 40.0000 mg | ORAL_TABLET | Freq: Every day | ORAL | 3 refills | Status: DC

## 2018-02-25 MED ORDER — TADALAFIL 20 MG PO TABS: 10.0000 mg | ORAL_TABLET | Freq: Every day | ORAL | 3 refills | Status: DC | PRN

## 2018-02-25 NOTE — Assessment & Plan Note (Signed)
A1C today No microalbumin secondary to ACEI/ARB therapy Encouraged him to consume a low carb diet Foot exam today Encouraged yearly eye exams He declines immunizations Continue Metformin and Glipizide

## 2018-02-25 NOTE — Assessment & Plan Note (Signed)
Cialis refilled today 

## 2018-02-25 NOTE — Assessment & Plan Note (Signed)
Refilled Diovan HCT and Verapamil CBC and CMET today Reinforced DASH diet and increase aerobic exercise.

## 2018-02-25 NOTE — Assessment & Plan Note (Signed)
Controlled on Omeprazole Discussed how weight loss could help improve reflux CBC and CMET today

## 2018-02-25 NOTE — Assessment & Plan Note (Signed)
Encouraged weight loss and core strengthening Can take Aleve prn

## 2018-02-25 NOTE — Assessment & Plan Note (Signed)
CMET and lipid profile today Encouraged him to consume a low fat diet Continue Atorvastatin for now 

## 2018-02-25 NOTE — Progress Notes (Signed)
HPI  He is transferring care from [facility].  Arthritis: Mainly in his lower back. He reports it is achy and stiff. He does not take anything OTC for his symptoms.   DM 2: His last A1C was checked 4 years ago. He is taking Metformin and Glipizide as prescribed. He does not check his sugars. His last eye exam was 01/2018. He does not check his feet routinely. He does not take flu or pneumonia vaccines.  GERD: He is not sure what triggers this. He denies breakthrough on Omeprazole.   HLD: He has not had his cholesterol checked in years. He takes Atorvastatin as prescribed. He tries to consume a low fat diet.  OSA: He averages about 3 hours of sleep per night. He does not wear his CPAP. He does feel fatigued throughout the day.  Migraine: Currently not an issue, just intermittent headaches. He does not take anything OTC for this.  HTN: His BP today is 162/94. He has been out of his Diovan HCT and Verapamil for 1-2 months. There is no ECG on file.  ED: He can not initiate erections. He takes Cialis as needed and would like a refill of that today.   Past Medical History:  Diagnosis Date  . Arthritis    low back  . Diabetes mellitus   . GERD (gastroesophageal reflux disease)   . History of chicken pox   . Hypertension   . Migraine   . Patellofemoral pain syndrome   . Sleep apnea     Current Outpatient Medications  Medication Sig Dispense Refill  . aspirin 81 MG tablet Take 81 mg by mouth daily.     Marland Kitchen atorvastatin (LIPITOR) 40 MG tablet Take 40 mg by mouth daily.    Marland Kitchen glipiZIDE (GLUCOTROL) 10 MG tablet Take 10 mg by mouth daily before breakfast.    . metFORMIN (GLUCOPHAGE) 1000 MG tablet Take 1,000 mg by mouth 2 (two) times daily with a meal.    . omeprazole (PRILOSEC) 20 MG capsule TAKE ONE CAPSULE BY MOUTH EVERY DAY 30 capsule 5  . OVER THE COUNTER MEDICATION     . tadalafil (CIALIS) 20 MG tablet Take 10 mg by mouth.     . triamcinolone ointment (KENALOG) 0.5 % Apply  1 application topically 2 (two) times daily. 30 g 0  . valsartan-hydrochlorothiazide (DIOVAN-HCT) 160-25 MG per tablet Take 1 tablet by mouth daily.     . verapamil (CALAN-SR) 180 MG CR tablet Take 180 mg by mouth at bedtime.     No current facility-administered medications for this visit.     No Known Allergies  Family History  Problem Relation Age of Onset  . Cancer Father        prostate    Social History   Socioeconomic History  . Marital status: Married    Spouse name: Not on file  . Number of children: 2  . Years of education: Not on file  . Highest education level: Not on file  Occupational History  . Occupation: Psychologist, forensic, [employer]  Social Needs  . Financial resource strain: Not on file  . Food insecurity:    Worry: Not on file    Inability: Not on file  . Transportation needs:    Medical: Not on file    Non-medical: Not on file  Tobacco Use  . Smoking status: Never Smoker  . Smokeless tobacco: Never Used  Substance and Sexual Activity  . Alcohol use: Yes    Comment:  occasional  . Drug use: No  . Sexual activity: Not on file  Lifestyle  . Physical activity:    Days per week: Not on file    Minutes per session: Not on file  . Stress: Not on file  Relationships  . Social connections:    Talks on phone: Not on file    Gets together: Not on file    Attends religious service: Not on file    Active member of club or organization: Not on file    Attends meetings of clubs or organizations: Not on file    Relationship status: Not on file  . Intimate partner violence:    Fear of current or ex partner: Not on file    Emotionally abused: Not on file    Physically abused: Not on file    Forced sexual activity: Not on file  Other Topics Concern  . Not on file  Social History Narrative  . Not on file    ROS:  Constitutional: Denies fever, malaise, fatigue, headache or abrupt weight changes.  HEENT: Denies eye pain, eye redness, ear  pain, ringing in the ears, wax buildup, runny nose, nasal congestion, bloody nose, or sore throat. Respiratory: Pt reports chronic cough. Denies difficulty breathing, shortness of breath, or sputum production.   Cardiovascular: Denies chest pain, chest tightness, palpitations or swelling in the hands or feet.  Gastrointestinal: Denies abdominal pain, bloating, constipation, diarrhea or blood in the stool.  GU: Pt reports erectile dysfunction. Denies frequency, urgency, pain with urination, blood in urine, odor or discharge. Musculoskeletal: Pt reports chronic low back pain. Denies decrease in range of motion, difficulty with gait, muscle pain or joint swelling.  Skin: Pt reports rash or right lower abdomen. Denies redness, lesions or ulcercations.  Neurological: Denies dizziness, difficulty with memory, difficulty with speech or problems with balance and coordination.  Psych: Denies anxiety, depression, SI/HI.  No other specific complaints in a complete review of systems (except as listed in HPI above).  PE:  BP (!) 162/94   Pulse 100   Temp 98.1 F (36.7 C) (Oral)   Ht  (1.676 m)   Wt 212 lb (96.2 kg)   SpO2 96%   BMI 34.22 kg/m  Wt Readings from Last 3 Encounters:  02/25/18 212 lb (96.2 kg)  01/31/18 214 lb (97.1 kg)  04/06/15 195 lb (88.5 kg)    General: Appears his stated age, obese, in NAD. HEENT: Throat/Mouth: Teeth present, mucosa pink and moist, no lesions or ulcerations noted.  Neck: Neck supple, trachea midline. No masses, lumps or thyromegaly present.  Cardiovascular: Normal rate and rhythm. S1,S2 noted.  Murmur noted. Pulmonary/Chest: Normal effort and positive vesicular breath sounds. No respiratory distress. No wheezes, rales or ronchi noted.  Abdomen: Soft and nontender. Normal bowel sounds Musculoskeletal: No difficulty with gait.  Neurological: Alert and oriented.  Psychiatric: Mood and affect normal. Behavior is normal. Judgment and thought content normal.      BMET    Component Value Date/Time   NA 138 12/14/2008 1023   K 4.3 12/14/2008 1023   CL 99 12/14/2008 1023   CO2 28 12/14/2008 1023   GLUCOSE 157 (H) 12/14/2008 1023   BUN 19 12/14/2008 1023   CREATININE 1.2 12/14/2008 1023   CALCIUM 10.2 12/14/2008 1023   GFRNONAA 68 12/14/2008 1023   GFRAA 82 12/14/2008 1023    Lipid Panel     Component Value Date/Time   CHOL 162 12/14/2008 1023   TRIG 140 12/14/2008  1023   HDL 31.2 (L) 12/14/2008 1023   CHOLHDL 5.2 CALC 12/14/2008 1023   VLDL 28 12/14/2008 1023   LDLCALC 103 (H) 12/14/2008 1023    CBC    Component Value Date/Time   WBC 5.5 12/14/2008 1023   RBC 4.64 12/14/2008 1023   HGB 14.0 12/14/2008 1023   HCT 41.2 12/14/2008 1023   PLT 248 12/14/2008 1023   MCV 88.8 12/14/2008 1023   MCHC 33.9 12/14/2008 1023   RDW 12.9 12/14/2008 1023   MONOABS 0.4 12/14/2008 1023   EOSABS 0.1 12/14/2008 1023   BASOSABS 0.0 12/14/2008 1023    Hgb A1C Lab Results  Component Value Date   HGBA1C 11.4 (H) 11/26/2013     Assessment and Plan:  Chronic Cough:  ? PND Try Zyrtec daily  RTC in 3 weeks for follow up HTN BAITY, REGINA, NP

## 2018-02-25 NOTE — Patient Instructions (Signed)
Fat and Cholesterol Restricted Diet Getting too much fat and cholesterol in your diet may cause health problems. Following this diet helps keep your fat and cholesterol at normal levels. This can keep you from getting sick. What types of fat should I choose?  Choose monosaturated and polyunsaturated fats. These are found in foods such as olive oil, canola oil, flaxseeds, walnuts, almonds, and seeds.  Eat more omega-3 fats. Good choices include salmon, mackerel, sardines, tuna, flaxseed oil, and ground flaxseeds.  Limit saturated fats. These are in animal products such as meats, butter, and cream. They can also be in plant products such as palm oil, palm kernel oil, and coconut oil.  Avoid foods with partially hydrogenated oils in them. These contain trans fats. Examples of foods that have trans fats are stick margarine, some tub margarines, cookies, crackers, and other baked goods. What general guidelines do I need to follow?  Check food labels. Look for the words "trans fat" and "saturated fat."  When preparing a meal: ? Fill half of your plate with vegetables and green salads. ? Fill one fourth of your plate with whole grains. Look for the word "whole" as the first word in the ingredient list. ? Fill one fourth of your plate with lean protein foods.  Eat more foods that have fiber, like apples, carrots, beans, peas, and barley.  Eat more home-cooked foods. Eat less at restaurants and buffets.  Limit or avoid alcohol.  Limit foods high in starch and sugar.  Limit fried foods.  Cook foods without frying them. Baking, boiling, grilling, and broiling are all great options.  Lose weight if you are overweight. Losing even a small amount of weight can help your overall health. It can also help prevent diseases such as diabetes and heart disease. What foods can I eat? Grains Whole grains, such as whole wheat or whole grain breads, crackers, cereals, and pasta. Unsweetened oatmeal,  bulgur, barley, quinoa, or brown rice. Corn or whole wheat flour tortillas. Vegetables Fresh or frozen vegetables (raw, steamed, roasted, or grilled). Green salads. Fruits All fresh, canned (in natural juice), or frozen fruits. Meat and Other Protein Products Ground beef (85% or leaner), grass-fed beef, or beef trimmed of fat. Skinless chicken or turkey. Ground chicken or turkey. Pork trimmed of fat. All fish and seafood. Eggs. Dried beans, peas, or lentils. Unsalted nuts or seeds. Unsalted canned or dry beans. Dairy Low-fat dairy products, such as skim or 1% milk, 2% or reduced-fat cheeses, low-fat ricotta or cottage cheese, or plain low-fat yogurt. Fats and Oils Tub margarines without trans fats. Light or reduced-fat mayonnaise and salad dressings. Avocado. Olive, canola, sesame, or safflower oils. Natural peanut or almond butter (choose ones without added sugar and oil). The items listed above may not be a complete list of recommended foods or beverages. Contact your dietitian for more options. What foods are not recommended? Grains White bread. White pasta. White rice. Cornbread. Bagels, pastries, and croissants. Crackers that contain trans fat. Vegetables White potatoes. Corn. Creamed or fried vegetables. Vegetables in a cheese sauce. Fruits Dried fruits. Canned fruit in light or heavy syrup. Fruit juice. Meat and Other Protein Products Fatty cuts of meat. Ribs, chicken wings, bacon, sausage, bologna, salami, chitterlings, fatback, hot dogs, bratwurst, and packaged luncheon meats. Liver and organ meats. Dairy Whole or 2% milk, cream, half-and-half, and cream cheese. Whole milk cheeses. Whole-fat or sweetened yogurt. Full-fat cheeses. Nondairy creamers and whipped toppings. Processed cheese, cheese spreads, or cheese curds. Sweets and Desserts Corn   syrup, sugars, honey, and molasses. Candy. Jam and jelly. Syrup. Sweetened cereals. Cookies, pies, cakes, donuts, muffins, and ice  cream. Fats and Oils Butter, stick margarine, lard, shortening, ghee, or bacon fat. Coconut, palm kernel, or palm oils. Beverages Alcohol. Sweetened drinks (such as sodas, lemonade, and fruit drinks or punches). The items listed above may not be a complete list of foods and beverages to avoid. Contact your dietitian for more information. This information is not intended to replace advice given to you by your health care provider. Make sure you discuss any questions you have with your health care provider. Document Released: 04/16/2012 Document Revised: 06/22/2016 Document Reviewed: 01/15/2014 Elsevier Interactive Patient Education  2018 Else[employer] Inc.  

## 2018-02-25 NOTE — Assessment & Plan Note (Signed)
Discussed how weight loss can help improve sleep apnea Encouraged use of CPAP

## 2018-02-26 LAB — LIPID PANEL
Cholesterol: 87 mg/dL (ref 0–200)
HDL: 27.7 mg/dL — ABNORMAL LOW (ref >39.00)
NONHDL: 59.23
TRIGLYCERIDES: 300 mg/dL — AB (ref 0.0–149.0)
Total CHOL/HDL Ratio: 3
VLDL: 60 mg/dL — AB (ref 0.0–40.0)

## 2018-02-26 LAB — CBC
HEMATOCRIT: 37.4 % — AB (ref 39.0–52.0)
HEMOGLOBIN: 12.7 g/dL — AB (ref 13.0–17.0)
MCHC: 34 g/dL (ref 30.0–36.0)
MCV: 88.6 fl (ref 78.0–100.0)
PLATELETS: 233 10*3/uL (ref 150.0–400.0)
RBC: 4.21 Mil/uL — ABNORMAL LOW (ref 4.22–5.81)
RDW: 13.6 % (ref 11.5–15.5)
WBC: 4.8 10*3/uL (ref 4.0–10.5)

## 2018-02-26 LAB — COMPREHENSIVE METABOLIC PANEL
ALT: 37 U/L (ref 0–53)
AST: 23 U/L (ref 0–37)
Albumin: 4.2 g/dL (ref 3.5–5.2)
Alkaline Phosphatase: 92 U/L (ref 39–117)
BILIRUBIN TOTAL: 0.4 mg/dL (ref 0.2–1.2)
BUN: 13 mg/dL (ref 6–23)
CALCIUM: 9.5 mg/dL (ref 8.4–10.5)
CHLORIDE: 100 meq/L (ref 96–112)
CO2: 25 meq/L (ref 19–32)
Creatinine, Ser: 0.93 mg/dL (ref 0.40–1.50)
GFR: 106.18 mL/min (ref >60.00)
GLUCOSE: 254 mg/dL — AB (ref 70–99)
Potassium: 3.9 mEq/L (ref 3.5–5.1)
Sodium: 135 mEq/L (ref 135–145)
Total Protein: 6.8 g/dL (ref 6.0–8.3)

## 2018-02-26 LAB — LDL CHOLESTEROL, DIRECT: Direct LDL: 38 mg/dL

## 2018-02-26 LAB — HEMOGLOBIN A1C: HEMOGLOBIN A1C: 9.1 % — AB (ref 4.6–6.5)

## 2018-02-27 ENCOUNTER — Telehealth: Payer: Self-pay | Admitting: Internal Medicine

## 2018-02-27 ENCOUNTER — Encounter: Payer: Self-pay | Admitting: Internal Medicine

## 2018-02-27 NOTE — Telephone Encounter (Signed)
Pt stopped in and wants you to know he has used the Miralax once a day and has NO relief. He said it is not working at all, he has had a bowel movement on average once a week. He is scheduled to see you 03/18/18, is there anything you can recommend?

## 2018-02-28 MED ORDER — FENOFIBRATE 54 MG PO TABS: 54.0000 mg | ORAL_TABLET | Freq: Every day | ORAL | 0 refills | Status: DC

## 2018-02-28 MED ORDER — GLIPIZIDE 10 MG PO TABS: 10.0000 mg | ORAL_TABLET | Freq: Two times a day (BID) | ORAL | 0 refills | Status: DC

## 2018-02-28 NOTE — Telephone Encounter (Signed)
I usually recommend adding senna-s 2 tabs daily to the miralax. If that still doesn't work--he can make them both twice a day

## 2018-02-28 NOTE — Addendum Note (Signed)
Addended by: Roena Malady on: 02/28/2018 02:16 PM   Modules accepted: Orders

## 2018-02-28 NOTE — Telephone Encounter (Signed)
Pt is aware of instructions and expressed understanding.Randall KitchenMarland Pace

## 2018-03-11 ENCOUNTER — Telehealth: Payer: Self-pay | Admitting: Internal Medicine

## 2018-03-11 NOTE — Telephone Encounter (Unsigned)
Copied from CRM [id number]. Topic: Quick Communication - See Telephone Encounter >> Mar 11, 2018  4:08 PM Raquel Sarna wrote: Windy Fast - [phone]  ext. [phone]  Pt gave permission to give his recent reading of his A1C.

## 2018-03-11 NOTE — Telephone Encounter (Signed)
I let Deb with [facility] know that we have to have a signed release from the patient in order to release any medical info to her.  She understands and states that she primarily needs to have a faxed copy of patient's last A1C reading  Sent to [phone] once we receive the necessary release.  [facility] is helping cover his upcoming surgical procedure and will need that documentation for coverage.  I LM on patient's cell VM (okay per DPR) stating that we need him to come to office to sign release before anything can be communicated to [facility].

## 2018-03-15 NOTE — Telephone Encounter (Signed)
Deb [facility] called - she is asking about a1c labs - she has not received fax yet Please fax to - [phone] ext [phone]

## 2018-03-15 NOTE — Telephone Encounter (Signed)
Last A1C reading has been faxed

## 2018-03-15 NOTE — Telephone Encounter (Signed)
Patient came in on Wednesday to sign release, Irving Burton was working on this, will forward to her for an update.

## 2018-03-18 ENCOUNTER — Ambulatory Visit: Payer: Managed Care, Other (non HMO) | Admitting: Internal Medicine

## 2018-03-18 DIAGNOSIS — Z0289 Encounter for other administrative examinations: Secondary | ICD-10-CM

## 2018-03-19 ENCOUNTER — Ambulatory Visit (INDEPENDENT_AMBULATORY_CARE_PROVIDER_SITE_OTHER): Payer: Managed Care, Other (non HMO) | Admitting: Internal Medicine

## 2018-03-19 VITALS — BP 144/78 | HR 88 | Temp 97.9°F | Wt 209.0 lb

## 2018-03-19 DIAGNOSIS — R252 Cramp and spasm: Secondary | ICD-10-CM

## 2018-03-19 DIAGNOSIS — I10 Essential (primary) hypertension: Secondary | ICD-10-CM | POA: Diagnosis not present

## 2018-03-19 LAB — BASIC METABOLIC PANEL
BUN: 19 mg/dL (ref 6–23)
CALCIUM: 10.2 mg/dL (ref 8.4–10.5)
CO2: 28 mEq/L (ref 19–32)
Chloride: 101 mEq/L (ref 96–112)
Creatinine, Ser: 1.13 mg/dL (ref 0.40–1.50)
GFR: 84.79 mL/min (ref >60.00)
GLUCOSE: 153 mg/dL — AB (ref 70–99)
POTASSIUM: 4.4 meq/L (ref 3.5–5.1)
SODIUM: 138 meq/L (ref 135–145)

## 2018-03-19 LAB — MAGNESIUM: MAGNESIUM: 1.4 mg/dL — AB (ref 1.5–2.5)

## 2018-03-19 NOTE — Progress Notes (Signed)
Subjective:    Patient ID: Randall Pace, male    DOB: [birth year], 62 y.o.   MRN: [id number]  HPI  Pt presents to the clinic today for 3 week follow up of HTN. At his last visit, he was restarted on his Diovan HCT and Verapamil. He has been taking the medication as prescribed, but reports he missed taking it yesterday. He denies adverse effects. He did have some cramping in his right thigh that lasted for 2 days, but reports he has not been having muscle cramps in general. His BP today is 144/78.There is no ECG on file.  Review of Systems  Past Medical History:  Diagnosis Date  . Arthritis    low back  . Diabetes mellitus   . GERD (gastroesophageal reflux disease)   . History of chicken pox   . Hypertension   . Migraine   . Patellofemoral pain syndrome   . Sleep apnea     Current Outpatient Medications  Medication Sig Dispense Refill  . aspirin 81 MG tablet Take 81 mg by mouth daily.     Marland Kitchen atorvastatin (LIPITOR) 40 MG tablet Take 1 tablet (40 mg total) by mouth daily. 90 tablet 3  . fenofibrate 54 MG tablet Take 1 tablet (54 mg total) by mouth daily. 90 tablet 0  . glipiZIDE (GLUCOTROL) 10 MG tablet Take 1 tablet (10 mg total) by mouth 2 (two) times daily before a meal. 180 tablet 0  . metFORMIN (GLUCOPHAGE) 1000 MG tablet Take 1 tablet (1,000 mg total) by mouth 2 (two) times daily with a meal. 180 tablet 3  . omeprazole (PRILOSEC) 20 MG capsule Take 1 capsule (20 mg total) by mouth daily. 90 capsule 3  . OVER THE COUNTER MEDICATION     . tadalafil (CIALIS) 20 MG tablet Take 0.5 tablets (10 mg total) by mouth daily as needed for erectile dysfunction. 30 tablet 3  . triamcinolone ointment (KENALOG) 0.5 % Apply 1 application topically 2 (two) times daily. 30 g 0  . valsartan-hydrochlorothiazide (DIOVAN-HCT) 160-25 MG tablet Take 1 tablet by mouth daily. 90 tablet 3  . verapamil (CALAN-SR) 180 MG CR tablet Take 1 tablet (180 mg total) by mouth at bedtime. 90 tablet 3   No  current facility-administered medications for this visit.     No Known Allergies  Family History  Problem Relation Age of Onset  . Cancer Father        prostate    Social History   Socioeconomic History  . Marital status: Married    Spouse name: Not on file  . Number of children: 2  . Years of education: Not on file  . Highest education level: Not on file  Occupational History  . Occupation: Psychologist, forensic, [employer]  Social Needs  . Financial resource strain: Not on file  . Food insecurity:    Worry: Not on file    Inability: Not on file  . Transportation needs:    Medical: Not on file    Non-medical: Not on file  Tobacco Use  . Smoking status: Never Smoker  . Smokeless tobacco: Never Used  Substance and Sexual Activity  . Alcohol use: Yes    Comment: occasional  . Drug use: No  . Sexual activity: Not on file  Lifestyle  . Physical activity:    Days per week: Not on file    Minutes per session: Not on file  . Stress: Not on file  Relationships  . Social connections:  Talks on phone: Not on file    Gets together: Not on file    Attends religious service: Not on file    Active member of club or organization: Not on file    Attends meetings of clubs or organizations: Not on file    Relationship status: Not on file  . Intimate partner violence:    Fear of current or ex partner: Not on file    Emotionally abused: Not on file    Physically abused: Not on file    Forced sexual activity: Not on file  Other Topics Concern  . Not on file  Social History Narrative  . Not on file     Constitutional: Denies fever, malaise, fatigue, headache or abrupt weight changes.  Respiratory: Denies difficulty breathing, shortness of breath, cough or sputum production.   Cardiovascular: Denies chest pain, chest tightness, palpitations or swelling in the hands or feet.  Musculoskeletal: Pt reports muscle cramp, right thigh. Denies decrease in range of motion,  difficulty with gait, or joint pain and swelling.  Neurological: Denies dizziness, difficulty with memory, difficulty with speech or problems with balance and coordination.    No other specific complaints in a complete review of systems (except as listed in HPI above).     Objective:   Physical Exam   BP (!) 144/78   Pulse 88   Temp 97.9 F (36.6 C) (Oral)   Wt 209 lb (94.8 kg)   SpO2 98%   BMI 33.73 kg/m  Wt Readings from Last 3 Encounters:  03/19/18 209 lb (94.8 kg)  02/25/18 212 lb (96.2 kg)  01/31/18 214 lb (97.1 kg)    General: Appears her stated age, well developed, well nourished in NAD. Cardiovascular: Normal rate and rhythm. S1,S2 noted.  Murmur noted. No JVD or BLE edema.  Pulmonary/Chest: Normal effort and positive vesicular breath sounds. No respiratory distress. No wheezes, rales or ronchi noted.  Musculoskeletal: No pain with palpation over the area of concern of the right thigh. No difficulty with gait.  Neurological: Alert and oriented.   BMET    Component Value Date/Time   NA 138 03/19/2018 0943   K 4.4 03/19/2018 0943   CL 101 03/19/2018 0943   CO2 28 03/19/2018 0943   GLUCOSE 153 (H) 03/19/2018 0943   BUN 19 03/19/2018 0943   CREATININE 1.13 03/19/2018 0943   CALCIUM 10.2 03/19/2018 0943   GFRNONAA 68 12/14/2008 1023   GFRAA 82 12/14/2008 1023    Lipid Panel     Component Value Date/Time   CHOL 87 02/25/2018 1621   TRIG 300.0 (H) 02/25/2018 1621   HDL 27.70 (L) 02/25/2018 1621   CHOLHDL 3 02/25/2018 1621   VLDL 60.0 (H) 02/25/2018 1621   LDLCALC 103 (H) 12/14/2008 1023    CBC    Component Value Date/Time   WBC 4.8 02/25/2018 1621   RBC 4.21 (L) 02/25/2018 1621   HGB 12.7 (L) 02/25/2018 1621   HCT 37.4 (L) 02/25/2018 1621   PLT 233.0 02/25/2018 1621   MCV 88.6 02/25/2018 1621   MCHC 34.0 02/25/2018 1621   RDW 13.6 02/25/2018 1621   MONOABS 0.4 12/14/2008 1023   EOSABS 0.1 12/14/2008 1023   BASOSABS 0.0 12/14/2008 1023    Hgb  A1C Lab Results  Component Value Date   HGBA1C 9.1 (H) 02/25/2018           Assessment & Plan:   Muscle Cramp, Right Thigh:  Likely just strained Will check BMET and Mg  today Encouraged stretching, push fluids  RTC in 2 months for follow up chronic conditions Nicki Reaper, NP

## 2018-03-19 NOTE — Patient Instructions (Signed)

## 2018-03-20 ENCOUNTER — Encounter: Payer: Self-pay | Admitting: Internal Medicine

## 2018-03-20 NOTE — Assessment & Plan Note (Signed)
Improved  Hard to judge his true BP on meds as he has not taken the meds in 2 days Discussed the importance of medication compliance, take BP 2 hours prior to appointments BMET today Reinforced DASH diet and exercise for weight loss

## 2018-05-11 ENCOUNTER — Other Ambulatory Visit: Payer: Self-pay | Admitting: Internal Medicine

## 2018-06-04 ENCOUNTER — Encounter: Payer: Self-pay | Admitting: Internal Medicine

## 2018-06-04 ENCOUNTER — Ambulatory Visit (INDEPENDENT_AMBULATORY_CARE_PROVIDER_SITE_OTHER): Payer: Managed Care, Other (non HMO) | Admitting: Internal Medicine

## 2018-06-04 VITALS — BP 146/84 | HR 85 | Temp 97.9°F | Wt 212.0 lb

## 2018-06-04 DIAGNOSIS — E7800 Pure hypercholesterolemia, unspecified: Secondary | ICD-10-CM | POA: Diagnosis not present

## 2018-06-04 DIAGNOSIS — I10 Essential (primary) hypertension: Secondary | ICD-10-CM

## 2018-06-04 DIAGNOSIS — E119 Type 2 diabetes mellitus without complications: Secondary | ICD-10-CM

## 2018-06-04 LAB — COMPREHENSIVE METABOLIC PANEL
ALBUMIN: 4.4 g/dL (ref 3.5–5.2)
ALK PHOS: 62 U/L (ref 39–117)
ALT: 26 U/L (ref 0–53)
AST: 19 U/L (ref 0–37)
BILIRUBIN TOTAL: 0.4 mg/dL (ref 0.2–1.2)
BUN: 20 mg/dL (ref 6–23)
CALCIUM: 10.3 mg/dL (ref 8.4–10.5)
CO2: 26 meq/L (ref 19–32)
Chloride: 98 mEq/L (ref 96–112)
Creatinine, Ser: 1.27 mg/dL (ref 0.40–1.50)
GFR: 74.05 mL/min (ref >60.00)
Glucose, Bld: 179 mg/dL — ABNORMAL HIGH (ref 70–99)
Potassium: 4.1 mEq/L (ref 3.5–5.1)
Sodium: 135 mEq/L (ref 135–145)
TOTAL PROTEIN: 7.1 g/dL (ref 6.0–8.3)

## 2018-06-04 LAB — LIPID PANEL
CHOL/HDL RATIO: 4
CHOLESTEROL: 127 mg/dL (ref 0–200)
HDL: 31.5 mg/dL — ABNORMAL LOW (ref >39.00)
LDL Cholesterol: 59 mg/dL (ref 0–99)
NonHDL: 95.14
TRIGLYCERIDES: 179 mg/dL — AB (ref 0.0–149.0)
VLDL: 35.8 mg/dL (ref 0.0–40.0)

## 2018-06-04 LAB — HEMOGLOBIN A1C: Hgb A1c MFr Bld: 9.5 % — ABNORMAL HIGH (ref 4.6–6.5)

## 2018-06-04 MED ORDER — VERAPAMIL HCL ER 180 MG PO TBCR: 180.0000 mg | EXTENDED_RELEASE_TABLET | Freq: Every day | ORAL | 1 refills | Status: DC

## 2018-06-04 MED ORDER — VERAPAMIL HCL ER 180 MG PO TBCR: 180.0000 mg | EXTENDED_RELEASE_TABLET | Freq: Every day | ORAL | 2 refills | Status: DC

## 2018-06-04 NOTE — Assessment & Plan Note (Signed)
Discussed the importance of medication compliance Continue Valsartan HCT Continue Verapamil, refilled today CMET today Reinforced DASH diet and exercise for weight loss.

## 2018-06-04 NOTE — Assessment & Plan Note (Signed)
A1C today No microalbumin secondary to ARB therapy Encouraged him to consume a low carb diet and exercise for weight loss Encouraged yearly eye exams Foot exam today Continue Metformin and Glipizide for now He declines flu or pneumovax today

## 2018-06-04 NOTE — Progress Notes (Signed)
Subjective:    Patient ID: Randall Pace, male    DOB: 1955/11/18, 62 y.o.   MRN: [id number]  HPI  Pt presents to the clinic today for 3 month follow up of HTN, HLD and DM 2.  HTN: His BP today is 146/84. He is taking Valsartan HCT as prescribed. He reports he ran out of his Verapaminl 3 weeks ago.There is no ECG on file  HLD: His last LDL was 38, triglycerides 409, 01/2018. He denies myalgias on Atorvastatin and Fenofibrate. He does not consume a low fat diet.  DM 2: His last A1C was 9.1%, 01/2018. He is taking Metformin, Glipizide as prescribed. He does not check his sugars. He does not consume a low carb diet or get any routine exercise. He does not check his feet routinely. His last eye exam was 01/2018. He does not take flu or pneumonia vaccines.  Review of Systems      Past Medical History:  Diagnosis Date  . Arthritis    low back  . Diabetes mellitus   . GERD (gastroesophageal reflux disease)   . History of chicken pox   . Hypertension   . Migraine   . Patellofemoral pain syndrome   . Sleep apnea     Current Outpatient Medications  Medication Sig Dispense Refill  . aspirin 81 MG tablet Take 81 mg by mouth daily.     Marland Kitchen atorvastatin (LIPITOR) 40 MG tablet Take 1 tablet (40 mg total) by mouth daily. 90 tablet 3  . fenofibrate 54 MG tablet TAKE 1 TABLET DAILY 90 tablet 0  . glipiZIDE (GLUCOTROL) 10 MG tablet Take 1 tablet (10 mg total) by mouth 2 (two) times daily before a meal. 180 tablet 0  . metFORMIN (GLUCOPHAGE) 1000 MG tablet Take 1 tablet (1,000 mg total) by mouth 2 (two) times daily with a meal. 180 tablet 3  . omeprazole (PRILOSEC) 20 MG capsule Take 1 capsule (20 mg total) by mouth daily. 90 capsule 3  . OVER THE COUNTER MEDICATION     . tadalafil (CIALIS) 20 MG tablet Take 0.5 tablets (10 mg total) by mouth daily as needed for erectile dysfunction. 30 tablet 3  . triamcinolone ointment (KENALOG) 0.5 % Apply 1 application topically 2 (two) times daily. 30 g 0    . valsartan-hydrochlorothiazide (DIOVAN-HCT) 160-25 MG tablet Take 1 tablet by mouth daily. 90 tablet 3  . verapamil (CALAN-SR) 180 MG CR tablet Take 1 tablet (180 mg total) by mouth at bedtime. 90 tablet 2   No current facility-administered medications for this visit.     No Known Allergies  Family History  Problem Relation Age of Onset  . Cancer Father        prostate    Social History   Socioeconomic History  . Marital status: Married    Spouse name: Not on file  . Number of children: 2  . Years of education: Not on file  . Highest education level: Not on file  Occupational History  . Occupation: Psychologist, forensic, [employer]  Social Needs  . Financial resource strain: Not on file  . Food insecurity:    Worry: Not on file    Inability: Not on file  . Transportation needs:    Medical: Not on file    Non-medical: Not on file  Tobacco Use  . Smoking status: Never Smoker  . Smokeless tobacco: Never Used  Substance and Sexual Activity  . Alcohol use: Yes    Comment: occasional  .  Drug use: No  . Sexual activity: Not on file  Lifestyle  . Physical activity:    Days per week: Not on file    Minutes per session: Not on file  . Stress: Not on file  Relationships  . Social connections:    Talks on phone: Not on file    Gets together: Not on file    Attends religious service: Not on file    Active member of club or organization: Not on file    Attends meetings of clubs or organizations: Not on file    Relationship status: Not on file  . Intimate partner violence:    Fear of current or ex partner: Not on file    Emotionally abused: Not on file    Physically abused: Not on file    Forced sexual activity: Not on file  Other Topics Concern  . Not on file  Social History Narrative  . Not on file     Constitutional: Denies fever, malaise, fatigue, headache or abrupt weight changes.  Respiratory: Denies difficulty breathing, shortness of breath, cough  or sputum production.   Cardiovascular: Denies chest pain, chest tightness, palpitations or swelling in the hands or feet.  Gastrointestinal: Denies abdominal pain, bloating, constipation, diarrhea or blood in the stool.  GU: Denies urgency, frequency, pain with urination, burning sensation, blood in urine, odor or discharge. Skin: Denies redness, rashes, lesions or ulcercations.  Neurological: Denies dizziness, difficulty with memory, difficulty with speech or problems with balance   No other specific complaints in a complete review of systems (except as listed in HPI above).  Objective:   Physical Exam   BP (!) 146/84   Pulse 85   Temp 97.9 F (36.6 C) (Oral)   Wt 212 lb (96.2 kg)   SpO2 98%   BMI 34.22 kg/m  Wt Readings from Last 3 Encounters:  06/04/18 212 lb (96.2 kg)  03/19/18 209 lb (94.8 kg)  02/25/18 212 lb (96.2 kg)    General: Appears his stated age, obese in NAD. Skin: Warm, dry and intact. No ulcerations noted. Cardiovascular: Normal rate and rhythm. Murmur noted. No JVD or BLE edema. No carotid bruits noted. Pulmonary/Chest: Normal effort and positive vesicular breath sounds. No respiratory distress. No wheezes, rales or ronchi noted.  Neurological: Alert and oriented. Sensation intact to BLE.   BMET    Component Value Date/Time   NA 138 03/19/2018 0943   K 4.4 03/19/2018 0943   CL 101 03/19/2018 0943   CO2 28 03/19/2018 0943   GLUCOSE 153 (H) 03/19/2018 0943   BUN 19 03/19/2018 0943   CREATININE 1.13 03/19/2018 0943   CALCIUM 10.2 03/19/2018 0943   GFRNONAA 68 12/14/2008 1023   GFRAA 82 12/14/2008 1023    Lipid Panel     Component Value Date/Time   CHOL 87 02/25/2018 1621   TRIG 300.0 (H) 02/25/2018 1621   HDL 27.70 (L) 02/25/2018 1621   CHOLHDL 3 02/25/2018 1621   VLDL 60.0 (H) 02/25/2018 1621   LDLCALC 103 (H) 12/14/2008 1023    CBC    Component Value Date/Time   WBC 4.8 02/25/2018 1621   RBC 4.21 (L) 02/25/2018 1621   HGB 12.7 (L)  02/25/2018 1621   HCT 37.4 (L) 02/25/2018 1621   PLT 233.0 02/25/2018 1621   MCV 88.6 02/25/2018 1621   MCHC 34.0 02/25/2018 1621   RDW 13.6 02/25/2018 1621   MONOABS 0.4 12/14/2008 1023   EOSABS 0.1 12/14/2008 1023   BASOSABS 0.0  12/14/2008 1023    Hgb A1C Lab Results  Component Value Date   HGBA1C 9.1 (H) 02/25/2018           Assessment & Plan:

## 2018-06-04 NOTE — Assessment & Plan Note (Signed)
CMET and Lipid profile today Encouraged him to consume a low fat diet Continue Atorvastatin and Fenofibrate, will adjust if needed based on labs

## 2018-06-04 NOTE — Patient Instructions (Signed)

## 2018-06-07 MED ORDER — SITAGLIPTIN PHOSPHATE 100 MG PO TABS: 100.0000 mg | ORAL_TABLET | Freq: Every day | ORAL | 2 refills | Status: DC

## 2018-06-07 NOTE — Addendum Note (Signed)
Addended by: Roena MaladyEVONTENNO, MELANIE Y on: 06/07/2018 09:43 AM   Modules accepted: Orders

## 2018-06-10 ENCOUNTER — Other Ambulatory Visit: Payer: Self-pay | Admitting: *Deleted

## 2018-06-10 ENCOUNTER — Telehealth: Payer: Self-pay | Admitting: Internal Medicine

## 2018-06-10 MED ORDER — SITAGLIPTIN PHOSPHATE 100 MG PO TABS: 100.0000 mg | ORAL_TABLET | Freq: Every day | ORAL | 2 refills | Status: DC

## 2018-06-10 MED ORDER — SITAGLIPTIN PHOSPHATE 100 MG PO TABS: 100.0000 mg | ORAL_TABLET | Freq: Every day | ORAL | 0 refills | Status: DC

## 2018-06-10 NOTE — Telephone Encounter (Signed)
Copied from CRM [phone]. Topic: Quick Communication - Rx Refill/Question >> Jun 10, 2018 12:08 PM Randall Pace, Randall Pace wrote: Medication:sitaGLIPtin (JANUVIA) 100 MG tablet [[id number]][[id number]] pt called and stated that medication was sent to wrong pharmacy.   Has the patient contacted their pharmacy? No  Preferred Pharmacy (with phone number or street name):[facility] [place] [address] [phone] ([phone])    Agent: Please be advised that RX refills may take up to 3 business days. We ask that you follow-up with your pharmacy.

## 2018-06-10 NOTE — Telephone Encounter (Signed)
Rx forwarded to mail order pharmacy per patient request. Call to [facility]- let them know to cancel Rx.

## 2018-06-10 NOTE — Progress Notes (Signed)
Rx resent because it has to be sent in for 90 day supply quantity

## 2018-06-10 NOTE — Addendum Note (Signed)
Addended by: Roena MaladyEVONTENNO, Shrihan Putt Y on: 06/10/2018 03:41 PM   Modules accepted: Orders

## 2018-06-11 NOTE — Telephone Encounter (Signed)
Copied from CRM [phone]#[id number]. Topic: Quick Communication - Rx Refill/Question >> Jun 10, 2018 12:08 PM Luanna ColeDawoud, Jessica L wrote: Medication:sitaGLIPtin (JANUVIA) 100 MG tablet [[id number]][[id number]] pt called and stated that medication was sent to wrong pharmacy.   Has the patient contacted their pharmacy? No  Preferred Pharmacy (with phone number or street name):[facility] [place] [address] [phone] (Phone) [phone] (Fax)    Agent: Please be advised that RX refills may take up to 3 business days. We ask that you follow-up with your pharmacy. >> Jun 10, 2018 12:11 PM Beryle Beamsawoud, Joyce CopaJessica L wrote: Should patient wait for express scripts to fill? Please advise

## 2018-06-17 ENCOUNTER — Other Ambulatory Visit: Payer: Self-pay | Admitting: Internal Medicine

## 2018-08-12 ENCOUNTER — Other Ambulatory Visit: Payer: Self-pay | Admitting: Internal Medicine

## 2018-08-19 ENCOUNTER — Telehealth: Payer: Self-pay | Admitting: Internal Medicine

## 2018-08-19 NOTE — Telephone Encounter (Signed)
If he has private insurance, could he not download a coupon from the website?

## 2018-08-19 NOTE — Telephone Encounter (Signed)
Copied from CRM [id number]. Topic: General - Other >> Aug 19, 2018  1:13 PM Leafy Ro wrote: Reason for CRM: Pt is calling and he can not afford [place] . The cost 200.00 a month. Pt will check with his insurance to see if similar medication is covered that's cheaper. Pt has stopped taking [place]

## 2018-08-21 NOTE — Telephone Encounter (Signed)
Pt is aware as instructed and states he will call us if he does not qualify

## 2018-09-05 ENCOUNTER — Encounter: Payer: Self-pay | Admitting: Internal Medicine

## 2018-09-05 ENCOUNTER — Ambulatory Visit (INDEPENDENT_AMBULATORY_CARE_PROVIDER_SITE_OTHER): Payer: Managed Care, Other (non HMO) | Admitting: Internal Medicine

## 2018-09-05 ENCOUNTER — Telehealth: Payer: Self-pay | Admitting: Internal Medicine

## 2018-09-05 VITALS — BP 142/88 | HR 88 | Temp 97.9°F | Ht 66.0 in | Wt 210.0 lb

## 2018-09-05 DIAGNOSIS — E782 Mixed hyperlipidemia: Secondary | ICD-10-CM | POA: Diagnosis not present

## 2018-09-05 DIAGNOSIS — R21 Rash and other nonspecific skin eruption: Secondary | ICD-10-CM

## 2018-09-05 DIAGNOSIS — G4733 Obstructive sleep apnea (adult) (pediatric): Secondary | ICD-10-CM | POA: Diagnosis not present

## 2018-09-05 DIAGNOSIS — R079 Chest pain, unspecified: Secondary | ICD-10-CM | POA: Diagnosis not present

## 2018-09-05 DIAGNOSIS — I10 Essential (primary) hypertension: Secondary | ICD-10-CM | POA: Diagnosis not present

## 2018-09-05 DIAGNOSIS — E119 Type 2 diabetes mellitus without complications: Secondary | ICD-10-CM

## 2018-09-05 DIAGNOSIS — N529 Male erectile dysfunction, unspecified: Secondary | ICD-10-CM

## 2018-09-05 DIAGNOSIS — M1990 Unspecified osteoarthritis, unspecified site: Secondary | ICD-10-CM

## 2018-09-05 DIAGNOSIS — Z125 Encounter for screening for malignant neoplasm of prostate: Secondary | ICD-10-CM

## 2018-09-05 DIAGNOSIS — Z0001 Encounter for general adult medical examination with abnormal findings: Secondary | ICD-10-CM

## 2018-09-05 DIAGNOSIS — Z0000 Encounter for general adult medical examination without abnormal findings: Secondary | ICD-10-CM | POA: Diagnosis not present

## 2018-09-05 DIAGNOSIS — K219 Gastro-esophageal reflux disease without esophagitis: Secondary | ICD-10-CM

## 2018-09-05 LAB — COMPREHENSIVE METABOLIC PANEL
ALT: 32 U/L (ref 0–53)
AST: 21 U/L (ref 0–37)
Albumin: 4.5 g/dL (ref 3.5–5.2)
Alkaline Phosphatase: 61 U/L (ref 39–117)
BUN: 23 mg/dL (ref 6–23)
CHLORIDE: 102 meq/L (ref 96–112)
CO2: 30 mEq/L (ref 19–32)
Calcium: 10.5 mg/dL (ref 8.4–10.5)
Creatinine, Ser: 1.4 mg/dL (ref 0.40–1.50)
GFR: 66.11 mL/min (ref >60.00)
GLUCOSE: 164 mg/dL — AB (ref 70–99)
POTASSIUM: 4.4 meq/L (ref 3.5–5.1)
SODIUM: 139 meq/L (ref 135–145)
Total Bilirubin: 0.3 mg/dL (ref 0.2–1.2)
Total Protein: 7.1 g/dL (ref 6.0–8.3)

## 2018-09-05 LAB — CBC
HEMATOCRIT: 38.9 % — AB (ref 39.0–52.0)
Hemoglobin: 13.1 g/dL (ref 13.0–17.0)
MCHC: 33.7 g/dL (ref 30.0–36.0)
MCV: 88.9 fl (ref 78.0–100.0)
Platelets: 258 10*3/uL (ref 150.0–400.0)
RBC: 4.37 Mil/uL (ref 4.22–5.81)
RDW: 13.4 % (ref 11.5–15.5)
WBC: 5.3 10*3/uL (ref 4.0–10.5)

## 2018-09-05 LAB — PSA: PSA: 1.17 ng/mL (ref 0.10–4.00)

## 2018-09-05 LAB — LIPID PANEL
CHOLESTEROL: 158 mg/dL (ref 0–200)
HDL: 33.2 mg/dL — ABNORMAL LOW (ref >39.00)
LDL Cholesterol: 91 mg/dL (ref 0–99)
NONHDL: 124.92
Total CHOL/HDL Ratio: 5
Triglycerides: 170 mg/dL — ABNORMAL HIGH (ref 0.0–149.0)
VLDL: 34 mg/dL (ref 0.0–40.0)

## 2018-09-05 LAB — HEMOGLOBIN A1C: HEMOGLOBIN A1C: 8.1 % — AB (ref 4.6–6.5)

## 2018-09-05 NOTE — Progress Notes (Signed)
Subjective:    Patient ID: Randall Pace, male    DOB: [birth year], 62 y.o.   MRN: [id number]  HPI  Pt presents to the clinic today for his annual exam. He is also due to follow up chronic conditions.   Osteoarthritis: Mainly in his lower back.  He has had multiple x-rays and MRIs of his cervical spine but none of his lumbar spine.  He does not take anything over-the-counter for his pain, just feels with it.  DM 2 with Neuropathy: His last A1c was 9.5%, 05/2018.  He is taking Metformin and Glipizide as prescribed.  He reports he only took Craig for 1 month because it was too expensive.  He does not check his sugars.  He does not check his feet routinely.  His last eye exam was 04/2018.  He is currently not taking anything for neuropathy, and reports the pain in his feet have improved.  ED: He has difficulty initiating erections.  He takes Cialis as needed with good results.  HTN: His BP today is 142/88.  He is taking Valsartan-HCTZ and Verapamil as prescribed.  He reports an odd sensation in the left side of his chest.  He reports it comes and goes.  It does not always seem more exertion.  He denies associated dizziness, visual changes, shortness of breath or syncope.  There is no ECG on file.  HLD: His last LDL was 59, 05/2018.  He stopped taking Atorvastatin and Fenofibrate 5-6 months ago.  He denies myalgias.  He does eat fried food.  GERD: He is not sure what triggers this.  He denies breakthrough on Omeprazole.  There is no upper GI on file.  OSA: He reports he does not sleep well.  He averages about 4 to 5 hours per night.  He does not feel rested when he wakes up.  He does feel tired throughout the day.  He has a CPAP machine but does not wear it.  He has not had a sleep study in the last 5 years.  He also complains of a rash to his chest.  He has noticed this intermittently for the last few years.  He reports it is itchy.  He is concerned that the rash is spreading beneath the skin  into his chest.  He would like to see a dermatologist for further evaluation of this.  He also reports a rash to his face.  The rash feels like a little red bumps.  The rash does not itch or burn.  He has been putting Ketoconazole 2% cream with minimal relief.  Flu: never Tetanus: unsure Pneumovax: never PSA Screening: never Colon Screening: 03/2016 Vision Screening: annually, summer 2019 Dentist: annually  Diet: He does eat meat. He consumes fruits and veggies daily. He does eat fried food. He drinks mostly water, occasional energy drink. Exercise: None  Review of Systems      Past Medical History:  Diagnosis Date  . Arthritis    low back  . Diabetes mellitus   . GERD (gastroesophageal reflux disease)   . History of chicken pox   . Hypertension   . Migraine   . Patellofemoral pain syndrome   . Sleep apnea     Current Outpatient Medications  Medication Sig Dispense Refill  . aspirin 81 MG tablet Take 81 mg by mouth daily.     Marland Kitchen atorvastatin (LIPITOR) 40 MG tablet Take 1 tablet (40 mg total) by mouth daily. 90 tablet 3  . fenofibrate 54 MG tablet  TAKE 1 TABLET DAILY 90 tablet 0  . glipiZIDE (GLUCOTROL) 10 MG tablet TAKE 1 TABLET TWICE A DAY BEFORE A MEAL 180 tablet 0  . metFORMIN (GLUCOPHAGE) 1000 MG tablet Take 1 tablet (1,000 mg total) by mouth 2 (two) times daily with a meal. 180 tablet 3  . omeprazole (PRILOSEC) 20 MG capsule Take 1 capsule (20 mg total) by mouth daily. 90 capsule 3  . OVER THE COUNTER MEDICATION     . sitaGLIPtin (JANUVIA) 100 MG tablet Take 1 tablet (100 mg total) by mouth daily. 90 tablet 0  . tadalafil (CIALIS) 20 MG tablet Take 0.5 tablets (10 mg total) by mouth daily as needed for erectile dysfunction. 30 tablet 3  . triamcinolone ointment (KENALOG) 0.5 % Apply 1 application topically 2 (two) times daily. 30 g 0  . valsartan-hydrochlorothiazide (DIOVAN-HCT) 160-25 MG tablet Take 1 tablet by mouth daily. 90 tablet 3  . verapamil (CALAN-SR) 180 MG  CR tablet Take 1 tablet (180 mg total) by mouth at bedtime. 90 tablet 1   No current facility-administered medications for this visit.     No Known Allergies  Family History  Problem Relation Age of Onset  . Cancer Father        prostate    Social History   Socioeconomic History  . Marital status: Married    Spouse name: Not on file  . Number of children: 2  . Years of education: Not on file  . Highest education level: Not on file  Occupational History  . Occupation: Media planner, [employer]  Social Needs  . Financial resource strain: Not on file  . Food insecurity:    Worry: Not on file    Inability: Not on file  . Transportation needs:    Medical: Not on file    Non-medical: Not on file  Tobacco Use  . Smoking status: Never Smoker  . Smokeless tobacco: Never Used  Substance and Sexual Activity  . Alcohol use: Yes    Comment: occasional  . Drug use: No  . Sexual activity: Not on file  Lifestyle  . Physical activity:    Days per week: Not on file    Minutes per session: Not on file  . Stress: Not on file  Relationships  . Social connections:    Talks on phone: Not on file    Gets together: Not on file    Attends religious service: Not on file    Active member of club or organization: Not on file    Attends meetings of clubs or organizations: Not on file    Relationship status: Not on file  . Intimate partner violence:    Fear of current or ex partner: Not on file    Emotionally abused: Not on file    Physically abused: Not on file    Forced sexual activity: Not on file  Other Topics Concern  . Not on file  Social History Narrative  . Not on file     Constitutional: Denies fever, malaise, fatigue, headache or abrupt weight changes.  HEENT: Denies eye pain, eye redness, ear pain, ringing in the ears, wax buildup, runny nose, nasal congestion, bloody nose, or sore throat. Respiratory: Denies difficulty breathing, shortness of breath,  cough or sputum production.   Cardiovascular: Denies chest pain, chest tightness, palpitations or swelling in the hands or feet.  Gastrointestinal: Patient reports hernia of abdomen and intermittent constipation.  Denies abdominal pain, bloating, constipation, diarrhea or blood in the stool.  GU: Denies urgency, frequency, pain with urination, burning sensation, blood in urine, odor or discharge. Musculoskeletal: Patient reports intermittent low back pain.  Denies decrease in range of motion, difficulty with gait, muscle pain or joint swelling.  Skin: Patient reports rash of face and chest.  Denies  ulcercations.  Neurological: Denies dizziness, difficulty with memory, difficulty with speech or problems with balance and coordination.  Psych: Denies anxiety, depression, SI/HI.  No other specific complaints in a complete review of systems (except as listed in HPI above).  Objective:   Physical Exam Ht '5\' 6"'$  (1.676 m)   Wt 210 lb (95.3 kg)   BMI 33.89 kg/m  Wt Readings from Last 3 Encounters:  09/05/18 210 lb (95.3 kg)  06/04/18 212 lb (96.2 kg)  03/19/18 209 lb (94.8 kg)    General: Appears his stated age, obese, in NAD. Skin: Warm, dry and intact.  Pigmented areas noted on face around bilateral nares.  Small, scaly bump noted at the base of hair follicles or midline chest.  No ulcerations noted. HEENT: Head: normal shape and size; Eyes: sclera white, no icterus, conjunctiva pink, PERRLA and EOMs intact; Ears: Tm's gray and intact, normal light reflex; Throat/Mouth: Teeth present, mucosa pink and moist, no exudate, lesions or ulcerations noted.  Neck:  Neck supple, trachea midline. No masses, lumps or thyromegaly present.  Cardiovascular: Normal rate and rhythm. S1,S2 noted.  No murmur, rubs or gallops noted. No JVD or BLE edema. No carotid bruits noted. Pulmonary/Chest: Normal effort and positive vesicular breath sounds. No respiratory distress. No wheezes, rales or ronchi noted.    Abdomen: Soft and nontender. Normal bowel sounds.  Large hernia, small umbilical hernia noted. Musculoskeletal: Strength 5/5 BUE/BLE.  No difficulty with gait.  Neurological: Alert and oriented. Cranial nerves II-XII grossly intact. Coordination normal.  Psychiatric: Mood and affect normal. Behavior is normal. Judgment and thought content normal.     BMET    Component Value Date/Time   NA 135 06/04/2018 0923   K 4.1 06/04/2018 0923   CL 98 06/04/2018 0923   CO2 26 06/04/2018 0923   GLUCOSE 179 (H) 06/04/2018 0923   BUN 20 06/04/2018 0923   CREATININE 1.27 06/04/2018 0923   CALCIUM 10.3 06/04/2018 0923   GFRNONAA 68 12/14/2008 1023   GFRAA 82 12/14/2008 1023    Lipid Panel     Component Value Date/Time   CHOL 127 06/04/2018 0923   TRIG 179.0 (H) 06/04/2018 0923   HDL 31.50 (L) 06/04/2018 0923   CHOLHDL 4 06/04/2018 0923   VLDL 35.8 06/04/2018 0923   LDLCALC 59 06/04/2018 0923    CBC    Component Value Date/Time   WBC 4.8 02/25/2018 1621   RBC 4.21 (L) 02/25/2018 1621   HGB 12.7 (L) 02/25/2018 1621   HCT 37.4 (L) 02/25/2018 1621   PLT 233.0 02/25/2018 1621   MCV 88.6 02/25/2018 1621   MCHC 34.0 02/25/2018 1621   RDW 13.6 02/25/2018 1621   MONOABS 0.4 12/14/2008 1023   EOSABS 0.1 12/14/2008 1023   BASOSABS 0.0 12/14/2008 1023    Hgb A1C Lab Results  Component Value Date   HGBA1C 9.5 (H) 06/04/2018             Assessment & Plan:   Preventative Health Maintenance:  He declines flu, tetanus or pneumovax today. PSA screening done with labs today. Colon screening up-to-date Encouraged him to consume a balanced diet and exercise regimen Advised him to see an eye doctor and dentist annually Will  check CBC, C met, lipid, A1c and PSA  Left Sided Chest Pain:  Indication for EKG: Left sided chest pain Interpretation: Normal rate, normal QT interval, no acute findings Comparison: None ?  Anxiety Consider referral to cardiology for further evaluation  and stress test  Rash of Face, Chest:  Referral place to dermatology for further evaluation  RTC in 3 months for follow-up of chronic conditions Webb Silversmith, NP

## 2018-09-05 NOTE — Telephone Encounter (Signed)
Pt called in to give the name of the cream you spoke with him about. Ketoconazole cream 2%

## 2018-09-05 NOTE — Patient Instructions (Signed)

## 2018-09-05 NOTE — Telephone Encounter (Signed)
Ok.  He reports that cream does not appear to be helping.  I did not see anything was concerning for a fungal infection.  But hold off on refill and have him seen by dermatology first.  Referral has been placed.

## 2018-09-06 ENCOUNTER — Telehealth: Payer: Self-pay

## 2018-09-06 MED ORDER — SITAGLIPTIN PHOSPHATE 100 MG PO TABS: 100.0000 mg | ORAL_TABLET | Freq: Every day | ORAL | 3 refills | Status: DC

## 2018-09-06 NOTE — Assessment & Plan Note (Signed)
Not sleeping well without CPAP Referral placed to pulmonology for repeat sleep study Encouraged weight loss

## 2018-09-06 NOTE — Assessment & Plan Note (Signed)
Advised him how weight loss can help improve his reflux symptoms CBC and C met today Continue Omeprazole as prescribed for now

## 2018-09-06 NOTE — Assessment & Plan Note (Signed)
Discussed how weight loss can help improve his back pain Encouraged daily stretching, core strengthening Advised Tylenol 325 mg every 8 hours as needed for pain

## 2018-09-06 NOTE — Assessment & Plan Note (Signed)
Continue Cialis We will monitor

## 2018-09-06 NOTE — Assessment & Plan Note (Signed)
C met  And lipid profile today We will see if he needs to restart Atorvastatin and Fenofibrate based on labs Encouraged him to consume a low-fat diet

## 2018-09-06 NOTE — Assessment & Plan Note (Signed)
A1c today No microalbumin secondary to ARB therapy Encouraged him to consume a low carb diet and exercise for weight loss Continue metformin and glipizide for now, may need to restart Januvia- discount coupon given Encouraged yearly eye exam Foot exam today He declines flu or Pneumovax

## 2018-09-06 NOTE — Assessment & Plan Note (Signed)
Not well controlled We will see if he is willing to increas Valrsartan-HCTZ to 320-25 mg daily Continue verapamil as is Reinforced DASH diet and exercise for weight loss CBC and C met today

## 2018-09-06 NOTE — Addendum Note (Signed)
Addended by: Roena Malady on: 09/06/2018 05:03 PM   Modules accepted: Orders

## 2018-09-06 NOTE — Telephone Encounter (Addendum)
  Left message on voicemail Rx sent through e-scribe   ----- Message from Lorre Munroe, NP sent at 09/05/2018  6:11 PM EST ----- Call patient:  Liver and kidney function are normal.  Cholesterol looks good but triglycerides slightly elevated.  Consume a low-fat diet, increase aerobic exercise.  If not taking fish oil, can start 1000 mg 1 daily with food.  PSA and blood counts normal.  A1c down to 8.1% from 9.5%.  It appears that Januvia was working.  I want him to try to use the coupon and let me know if still too expensive.  Follow-up A1c in 3 months lab only.

## 2018-09-13 MED ORDER — VALSARTAN-HYDROCHLOROTHIAZIDE 320-25 MG PO TABS: 1.0000 | ORAL_TABLET | Freq: Every day | ORAL | 0 refills | Status: DC

## 2018-09-13 NOTE — Addendum Note (Signed)
Addended by: Roena MaladyEVONTENNO, Katie Moch Y on: 09/13/2018 01:16 PM   Modules accepted: Orders

## 2018-09-13 NOTE — Addendum Note (Signed)
Addended by: Roena MaladyEVONTENNO, Pachia Strum Y on: 09/13/2018 11:45 AM   Modules accepted: Orders

## 2018-09-15 ENCOUNTER — Other Ambulatory Visit: Payer: Self-pay | Admitting: Internal Medicine

## 2018-09-18 ENCOUNTER — Encounter: Payer: Self-pay | Admitting: Internal Medicine

## 2018-09-18 ENCOUNTER — Ambulatory Visit (INDEPENDENT_AMBULATORY_CARE_PROVIDER_SITE_OTHER): Payer: Managed Care, Other (non HMO) | Admitting: Internal Medicine

## 2018-09-18 ENCOUNTER — Institutional Professional Consult (permissible substitution): Payer: Managed Care, Other (non HMO) | Admitting: Pulmonary Disease

## 2018-09-18 VITALS — BP 140/88 | HR 91 | Ht 66.0 in | Wt 215.8 lb

## 2018-09-18 DIAGNOSIS — R05 Cough: Secondary | ICD-10-CM

## 2018-09-18 DIAGNOSIS — J309 Allergic rhinitis, unspecified: Secondary | ICD-10-CM | POA: Diagnosis not present

## 2018-09-18 DIAGNOSIS — R059 Cough, unspecified: Secondary | ICD-10-CM

## 2018-09-18 DIAGNOSIS — G4719 Other hypersomnia: Secondary | ICD-10-CM

## 2018-09-18 MED ORDER — TRIAMCINOLONE ACETONIDE 55 MCG/ACT NA AERO: 1.0000 | INHALATION_SPRAY | Freq: Two times a day (BID) | NASAL | 2 refills | Status: DC

## 2018-09-18 MED ORDER — ALBUTEROL SULFATE 108 (90 BASE) MCG/ACT IN AEPB: 2.0000 | INHALATION_SPRAY | RESPIRATORY_TRACT | 6 refills | Status: DC | PRN

## 2018-09-18 MED ORDER — CETIRIZINE HCL 10 MG PO TABS: 10.0000 mg | ORAL_TABLET | Freq: Every day | ORAL | 6 refills | Status: DC

## 2018-09-18 NOTE — Progress Notes (Signed)
Name: Dulcy FannyCharles Keegan MRN: [id number] DOB: [birth year]     CONSULTATION DATE:  REFERRING MD :   CHIEF COMPLAINT: cough and excessive daytime sleepiness  STUDIES:    NO scans found in system   HISTORY OF PRESENT ILLNESS:  62 yo male seen today for multiple complaints  COUGH associated with chest congestion Chronic in nature Has had it for many years Worsens with laughter Has sinus issues with allergic rhinitis Constant nasal drainage Has had sinus surgery in the past Intermittent shortness of breath and dyspnea on exertion progressive over the last several months Intermittent wheezing   Office spirometry shows normal ratio FEV1 is 96% predicted No evidence of obstructive airways disease  EXCESSIVE DAYTIME SLEEPINESS  Patient has been having excessive daytime sleepiness Patient has been having extreme fatigue and tiredness, lack of energy +  very Loud snoring every night + struggling breathe at night and gasps for air  Has been diagnosed with sleep apnea many years ago Does not want to wear CPAP however has decided to try again  EPWORTH SLEEP SCORE 18   Discussed sleep data and reviewed with patient.  Encouraged proper weight management.  Discussed driving precautions and its relationship with hypersomnolence.  Discussed operating dangerous equipment and its relationship with hypersomnolence.  Discussed sleep hygiene, and benefits of a fixed sleep waked time.  The importance of getting eight or more hours of sleep discussed with patient.  Discussed limiting the use of the computer and television before bedtime.  Decrease naps during the day, so night time sleep will become enhanced.  Limit caffeine, and sleep deprivation.  HTN, stroke, and heart failure are potential risk factors.   Patient has history of reflux Currently on proton pump inhibitor This helps tremendously  Patient is a non-smoker No secondhand smoke exposure No cancer. salesman for  [employer]  PAST MEDICAL HISTORY :   has a past medical history of Arthritis, Diabetes mellitus, GERD (gastroesophageal reflux disease), History of chicken pox, Hypertension, Migraine, Patellofemoral pain syndrome, and Sleep apnea.  has a past surgical history that includes palatoplasts; uvoloplasty; Nasal sinus surgery; and Tonsillectomy. Prior to Admission medications   Medication Sig Start Date End Date Taking? Authorizing Provider  aspirin 81 MG tablet Take 81 mg by mouth daily.    Yes [provider]  atorvastatin (LIPITOR) 40 MG tablet Take 1 tablet (40 mg total) by mouth daily. 02/25/18  Yes Lorre MunroeBaity, Regina W, NP  fenofibrate 54 MG tablet TAKE 1 TABLET DAILY 08/12/18  Yes Baity, Salvadore Oxfordegina W, NP  glipiZIDE (GLUCOTROL) 10 MG tablet TAKE 1 TABLET TWICE A DAY BEFORE A MEAL 09/16/18  Yes Lorre MunroeBaity, Regina W, NP  metFORMIN (GLUCOPHAGE) 1000 MG tablet Take 1 tablet (1,000 mg total) by mouth 2 (two) times daily with a meal. 02/25/18  Yes Baity, Salvadore Oxfordegina W, NP  omeprazole (PRILOSEC) 20 MG capsule Take 1 capsule (20 mg total) by mouth daily. 02/25/18  Yes Baity, Salvadore Oxfordegina W, NP  OVER THE COUNTER MEDICATION    Yes [provider]  sitaGLIPtin (JANUVIA) 100 MG tablet Take 1 tablet (100 mg total) by mouth daily. 09/06/18  Yes Lorre MunroeBaity, Regina W, NP  tadalafil (CIALIS) 20 MG tablet Take 0.5 tablets (10 mg total) by mouth daily as needed for erectile dysfunction. 02/25/18  Yes Lorre MunroeBaity, Regina W, NP  triamcinolone ointment (KENALOG) 0.5 % Apply 1 application topically 2 (two) times daily. 01/31/18  Yes Baity, Salvadore Oxfordegina W, NP  valsartan-hydrochlorothiazide (DIOVAN HCT) 320-25 MG tablet Take 1 tablet by mouth daily.  09/13/18  Yes Lorre Munroe, NP  verapamil (CALAN-SR) 180 MG CR tablet Take 1 tablet (180 mg total) by mouth at bedtime. 06/04/18  Yes Lorre Munroe, NP   No Known Allergies  FAMILY HISTORY:  family history includes Cancer in his father. SOCIAL HISTORY:  reports that he has never smoked. He has never  used smokeless tobacco. He reports that he drinks alcohol. He reports that he does not use drugs.  REVIEW OF SYSTEMS:   Constitutional: Negative for fever, chills, weight loss, +malaise/fatigue and -diaphoresis.  HENT: Negative for hearing loss, ear pain, nosebleeds, + congestion, sore throat, neck pain, tinnitus and ear discharge.   Eyes: Negative for blurred vision, double vision, photophobia, pain, discharge and redness.  Respiratory: + cough,- hemoptysis, +sputum production,+ shortness of breath, +wheezing and stridor.   Cardiovascular: Negative for chest pain, palpitations, orthopnea, claudication, leg swelling and PND.  Gastrointestinal: Negative for heartburn, nausea, vomiting, abdominal pain, diarrhea, constipation, blood in stool and melena.  Genitourinary: Negative for dysuria, urgency, frequency, hematuria and flank pain.  Musculoskeletal: Negative for myalgias, back pain, joint pain and falls.  Skin: Negative for itching and rash.  Neurological: Negative for dizziness, tingling, tremors, sensory change, speech change, focal weakness, seizures, loss of consciousness, weakness and headaches.  Endo/Heme/Allergies: Negative for environmental allergies and polydipsia. Does not bruise/bleed easily.  ALL OTHER ROS ARE NEGATIVE   BP 140/88 (BP Location: Left Arm, Cuff Size: Normal)   Pulse 91   Ht 5\' 6"  (1.676 m)   Wt 215 lb 12.8 oz (97.9 kg)   SpO2 99%   BMI 34.83 kg/m    Physical Examination:   GENERAL:NAD, no fevers, chills, no weakness no fatigue HEAD: Normocephalic, atraumatic.  EYES: Pupils equal, round, reactive to light. Extraocular muscles intact. No scleral icterus.  MOUTH: Moist mucosal membrane.   EAR, NOSE, THROAT: Clear without exudates. No external lesions.  NECK: Supple. No thyromegaly. No nodules. No JVD.  PULMONARY:CTA B/L no wheezes, no crackles, no rhonchi CARDIOVASCULAR: S1 and S2. Regular rate and rhythm. No murmurs, rubs, or gallops. No edema.   GASTROINTESTINAL: Soft, nontender, nondistended. No masses. Positive bowel sounds.  MUSCULOSKELETAL: No swelling, clubbing, or edema. Range of motion full in all extremities.  NEUROLOGIC: Cranial nerves II through XII are intact. No gross focal neurological deficits.  SKIN: No ulceration, lesions, rashes, or cyanosis. Skin warm and dry. Turgor intact.  PSYCHIATRIC: Mood, affect within normal limits. The patient is awake, alert and oriented x 3. Insight, judgment intact.      ASSESSMENT / PLAN: 62 year old pleasant African-American male seen today for several issues including chronic cough most likely related to chronic allergic rhinitis in the setting of GERD underlying signs and symptoms of sleep apnea associated with intermittent reactive airways disease from constant nasal drainage   Chronic cough Most likely from allergic rhinitis Start Zyrtec 10 mg daily Start Nasacort sprays daily I will start conservative approach with antihistamines and nasal steroids Recommend albuterol 2 puffs every 4 hours as needed if his cough persists patient will need chest x-ray Office spirometry does not show any evidence of obstructive    GERD continue PPI as prescribed    Excessive daytime sleepiness and history of sleep apnea Obtain sleep study to assess for OSA Epworth sleep score is 18      Patient/Family are satisfied with Plan of action and management. All questions answered  Lucie Leather, M.D.  [facility]  Medical Director [facility] Medical Director  [facility] Cardio-Pulmonary Department

## 2018-09-18 NOTE — Patient Instructions (Signed)
1. Start Zyrtec 10 Mg at every night  2.Start Nasacort nasal sprays  3.Continue meds for Reflux  4.obtain sleep study  5.albuterol for  cough every 4 hrs as needed

## 2018-09-24 ENCOUNTER — Telehealth: Payer: Self-pay | Admitting: Internal Medicine

## 2018-09-24 NOTE — Telephone Encounter (Signed)
[facility] calling stating the Triamcinolone nasal spray is discontinued  Needing to know about an alternative   Please call back    Ref num: [id number]

## 2018-09-25 ENCOUNTER — Telehealth: Payer: Self-pay | Admitting: Internal Medicine

## 2018-09-25 NOTE — Telephone Encounter (Signed)
Called patient and LM He needs to find out what nasal spray insurance will cover since Nasacort not available.

## 2018-09-25 NOTE — Telephone Encounter (Signed)
Duplicate message to be closed. Original message routed to DK for change in therapy of Nasacort.

## 2018-09-25 NOTE — Telephone Encounter (Signed)
Reference # [id number]  Please call regarding Triamcinolone nasal inhaler has been discontiued

## 2018-09-25 NOTE — Telephone Encounter (Signed)
Please call you insurance company and obtain copy of your medication formulary   This will help your Pulmonologist to prescribe the most cost effective medications that your insurance company allows.

## 2018-09-25 NOTE — Telephone Encounter (Signed)
Called [facility] and spoke with pharmacist Revonda StandardAllison. Nasacort went generic therefore no longer available with rx.

## 2018-09-25 NOTE — Telephone Encounter (Signed)
Per [facility] Nasacort has been d/c Please advise on change?

## 2018-11-10 ENCOUNTER — Other Ambulatory Visit: Payer: Self-pay | Admitting: Internal Medicine

## 2018-12-16 ENCOUNTER — Other Ambulatory Visit (INDEPENDENT_AMBULATORY_CARE_PROVIDER_SITE_OTHER): Payer: BLUE CROSS/BLUE SHIELD

## 2018-12-16 DIAGNOSIS — E782 Mixed hyperlipidemia: Secondary | ICD-10-CM | POA: Diagnosis not present

## 2018-12-16 DIAGNOSIS — E119 Type 2 diabetes mellitus without complications: Secondary | ICD-10-CM

## 2018-12-16 LAB — LIPID PANEL
CHOLESTEROL: 156 mg/dL (ref 0–200)
HDL: 31.2 mg/dL — ABNORMAL LOW (ref >39.00)
LDL CALC: 87 mg/dL (ref 0–99)
NonHDL: 124.32
Total CHOL/HDL Ratio: 5
Triglycerides: 186 mg/dL — ABNORMAL HIGH (ref 0.0–149.0)
VLDL: 37.2 mg/dL (ref 0.0–40.0)

## 2018-12-16 LAB — HEMOGLOBIN A1C: Hgb A1c MFr Bld: 8.8 % — ABNORMAL HIGH (ref 4.6–6.5)

## 2018-12-17 ENCOUNTER — Telehealth: Payer: Self-pay

## 2018-12-17 ENCOUNTER — Other Ambulatory Visit: Payer: Managed Care, Other (non HMO)

## 2018-12-17 DIAGNOSIS — G501 Atypical facial pain: Secondary | ICD-10-CM | POA: Diagnosis not present

## 2018-12-17 NOTE — Telephone Encounter (Signed)
Can you call and check on pt. Does not appear he went to [facility] ED.

## 2018-12-17 NOTE — Telephone Encounter (Signed)
Pt calling with lt side mid jaw pain and pain goes into neck and lt ear. Pt noticed singing in choir that he is more SOB than usual. Pt is not having CP or N&V; pt has had night sweats on and off. Pt said he is a terrible diabetic and is also being treated for hypertension. Pt said also this weekend pt has started feeling sluggish like he is in a fog. Advised pt with his chronic health issues and the new symptoms pt should go to ED for evaluation. Pt voiced understanding and will go to [facility] ED. FYI to Pamala Hurry NP.

## 2018-12-17 NOTE — Telephone Encounter (Signed)
Will look for ED notes

## 2018-12-18 ENCOUNTER — Telehealth: Payer: Self-pay

## 2018-12-18 NOTE — Telephone Encounter (Signed)
Pt left v/m requesting cb from [facility] CMA wanting to know what pain medication and dosage he can take with his other meds including recent abx.

## 2018-12-18 NOTE — Telephone Encounter (Signed)
[facility] and was prescribed Ax for "infected gland" per pt report... pt was told to f/u in 3 days if no improvement in Sx... pt has appt scheduled Fri to f/u

## 2018-12-18 NOTE — Telephone Encounter (Signed)
noted 

## 2018-12-19 NOTE — Telephone Encounter (Signed)
He can take limited Ibuprofen 400 mg every 8 hours. He can take Tylenol 1000 mg every 8 hours as needed

## 2018-12-19 NOTE — Telephone Encounter (Signed)
Pt is aware as instructed and expressed understanding 

## 2018-12-20 ENCOUNTER — Ambulatory Visit (INDEPENDENT_AMBULATORY_CARE_PROVIDER_SITE_OTHER): Payer: BLUE CROSS/BLUE SHIELD | Admitting: Internal Medicine

## 2018-12-20 ENCOUNTER — Encounter: Payer: Self-pay | Admitting: Internal Medicine

## 2018-12-20 VITALS — BP 148/96 | HR 98 | Temp 97.9°F | Wt 218.0 lb

## 2018-12-20 DIAGNOSIS — K1120 Sialoadenitis, unspecified: Secondary | ICD-10-CM | POA: Diagnosis not present

## 2018-12-20 MED ORDER — METHYLPREDNISOLONE ACETATE 80 MG/ML IJ SUSP
80.0000 mg | Freq: Once | INTRAMUSCULAR | Status: AC
  Administered 2018-12-20: 80 mg via INTRAMUSCULAR

## 2018-12-20 NOTE — Patient Instructions (Signed)
Parotitis  Parotitis means that you have irritation and swelling (inflammation) in one or both of your parotid glands. These glands make saliva. They are found on each side of your face, below and in front of your earlobes. You may or may not have pain with this condition. What are the causes? This condition may be caused by:  Infections from germs (bacteria or viruses).  Something blocking the flow of saliva through the parotid glands. This can be a stone, scar tissue, or a tumor.  Diseases that cause your body's defense system (immune system) to attack healthy cells in your salivary glands. These are called autoimmune diseases. What increases the risk? You are more likely to get this condition if:  You are 50 years old or older.  You do not drink enough fluids (are dehydrated).  You drink too much alcohol.  You have: ? A dry mouth. ? Diabetes. ? Gout. ? A long-term illness.  You do not take good care of your mouth and teeth (poor dental hygiene).  You have had radiation treatments to the head and neck.  You take certain medicines. What are the signs or symptoms? Symptoms of this condition depend on the cause. They may include:  Swelling under and in front of the ear. This may get worse after you eat.  Redness of the skin over the parotid gland.  Pain and tenderness over the parotid gland. This may get worse after you eat.  Fever or chills.  Pus coming from the ducts inside the mouth.  Dry mouth.  A bad taste in the mouth. How is this treated? Treatment for this condition depends on the cause. Treatment may include:  Antibiotic medicine for an infection from bacteria.  Drinking more fluids.  Removing a stone or obstruction.  Treating a disease that is causing parotitis.  Surgery to drain an infection, remove a growth, or remove the whole gland. Treatment may not be needed if the swelling goes away with home care. Follow these instructions at  home: Medicines   Take over-the-counter and prescription medicines only as told by your doctor.  If you were prescribed an antibiotic medicine, take it as told by your doctor. Do not stop taking the antibiotic even if you start to feel better. Managing pain and swelling  If told, put heat on the affected area. Do this as often as told by your doctor. Use the heat source that your doctor recommends, such as a moist heat pack or a heating pad. ? Place a towel between your skin and the heat source. ? Leave the heat on for 20-30 minutes. ? Remove the heat if your skin turns bright red. This is very important if you are unable to feel pain, heat, or cold. You may have a greater risk of getting burned.  Gargle with salt water 3-4 times a day or as needed. To make salt water, dissolve -1 tsp (3-6 g) of salt in 1 cup (237 mL) of warm water.  Gently rub your parotid glands as told by your doctor. General instructions   Drink enough fluid to keep your pee (urine) pale yellow.  Keep your mouth clean and moist.  Suck on sour candy. This may help to: ? Make your mouth less dry. ? Make more saliva.  Take good care of your mouth: ? Brush your teeth at least two times a day. ? Floss your teeth every day. ? See your dentist regularly.  Do not use any products that contain nicotine or   tobacco. These include cigarettes, e-cigarettes, and chewing tobacco. If you need help quitting, ask your doctor.  Do not drink alcohol.  Keep all follow-up visits as told by your doctor. This is important. Contact a doctor if:  You have a fever or chills.  You have new symptoms.  Your symptoms get worse.  Your symptoms do not get better with treatment. Get help right away if:  You have trouble breathing or swallowing. Summary  Parotitis means that you have irritation and swelling (inflammation) in one or both of your parotid glands.  Symptoms include pain and swelling under and in front of the  ear.  Treatment for parotitis depends on the cause. In some cases, the condition may go away on its own with home care.  You should drink plenty of fluids, take good care of your mouth, and avoid tobacco products. This information is not intended to replace advice given to you by your health care provider. Make sure you discuss any questions you have with your health care provider. Document Released: 11/18/2010 Document Revised: 05/14/2018 Document Reviewed: 05/14/2018 Elsevier Interactive Patient Education  2019 Elsevier Inc.  

## 2018-12-20 NOTE — Progress Notes (Signed)
Subjective:    Patient ID: Dulcy FannyCharles Eisenhardt, male    DOB: [birth year], 63 y.o.   MRN: [id number]  HPI  Pt presents to the clinic today for UC follow up. [facility], was diagnosed with left parotitis. He was prescribed Clindamycin and advised to take Ibuprofen and Tylenol OTC for pain and inflammation. He does report improvement in the pain and swelling. He denies fever, chills or body aches. He has not taken anything additional OTC.  Review of Systems  Past Medical History:  Diagnosis Date  . Arthritis    low back  . Diabetes mellitus   . GERD (gastroesophageal reflux disease)   . History of chicken pox   . Hypertension   . Migraine   . Patellofemoral pain syndrome   . Sleep apnea     Current Outpatient Medications  Medication Sig Dispense Refill  . Albuterol Sulfate (PROAIR RESPICLICK) 108 (90 Base) MCG/ACT AEPB Inhale 2 Act into the lungs every 4 (four) hours as needed. 1 each 6  . aspirin 81 MG tablet Take 81 mg by mouth daily.     Marland Kitchen. atorvastatin (LIPITOR) 40 MG tablet Take 1 tablet (40 mg total) by mouth daily. 90 tablet 3  . cetirizine (ZYRTEC ALLERGY) 10 MG tablet Take 1 tablet (10 mg total) by mouth daily. 30 tablet 6  . clindamycin (CLEOCIN) 300 MG capsule Take 600 mg by mouth 2 (two) times daily.    . fenofibrate 54 MG tablet TAKE 1 TABLET DAILY 90 tablet 0  . glipiZIDE (GLUCOTROL) 10 MG tablet TAKE 1 TABLET TWICE A DAY BEFORE A MEAL 180 tablet 1  . metFORMIN (GLUCOPHAGE) 1000 MG tablet Take 1 tablet (1,000 mg total) by mouth 2 (two) times daily with a meal. 180 tablet 3  . omeprazole (PRILOSEC) 20 MG capsule Take 1 capsule (20 mg total) by mouth daily. 90 capsule 3  . OVER THE COUNTER MEDICATION     . sitaGLIPtin (JANUVIA) 100 MG tablet Take 1 tablet (100 mg total) by mouth daily. 30 tablet 3  . tadalafil (CIALIS) 20 MG tablet Take 0.5 tablets (10 mg total) by mouth daily as needed for erectile dysfunction. 30 tablet 3  . triamcinolone (NASACORT) 55  MCG/ACT AERO nasal inhaler Place 1 spray into the nose 2 (two) times daily. 1 Inhaler 2  . triamcinolone ointment (KENALOG) 0.5 % Apply 1 application topically 2 (two) times daily. 30 g 0  . valsartan-hydrochlorothiazide (DIOVAN HCT) 320-25 MG tablet Take 1 tablet by mouth daily. 90 tablet 0  . verapamil (CALAN-SR) 180 MG CR tablet Take 1 tablet (180 mg total) by mouth at bedtime. 90 tablet 1   No current facility-administered medications for this visit.     No Known Allergies  Family History  Problem Relation Age of Onset  . Cancer Father        prostate    Social History   Socioeconomic History  . Marital status: Married    Spouse name: Not on file  . Number of children: 2  . Years of education: Not on file  . Highest education level: Not on file  Occupational History  . Occupation: Psychologist, forensicbuilding manager/mucisian, [employer]  Social Needs  . Financial resource strain: Not on file  . Food insecurity:    Worry: Not on file    Inability: Not on file  . Transportation needs:    Medical: Not on file    Non-medical: Not on file  Tobacco Use  .  Smoking status: Never Smoker  . Smokeless tobacco: Never Used  Substance and Sexual Activity  . Alcohol use: Yes    Comment: occasional  . Drug use: No  . Sexual activity: Not on file  Lifestyle  . Physical activity:    Days per week: Not on file    Minutes per session: Not on file  . Stress: Not on file  Relationships  . Social connections:    Talks on phone: Not on file    Gets together: Not on file    Attends religious service: Not on file    Active member of club or organization: Not on file    Attends meetings of clubs or organizations: Not on file    Relationship status: Not on file  . Intimate partner violence:    Fear of current or ex partner: Not on file    Emotionally abused: Not on file    Physically abused: Not on file    Forced sexual activity: Not on file  Other Topics Concern  . Not on file  Social History  Narrative  . Not on file     Constitutional: Denies fever, malaise, fatigue, headache or abrupt weight changes.  HEENT: Pt reports left ear pain. Denies eye pain, eye redness, ringing in the ears, wax buildup, runny nose, nasal congestion, bloody nose, or sore throat. Respiratory: Denies difficulty breathing, shortness of breath, cough or sputum production.   Cardiovascular: Denies chest pain, chest tightness, palpitations or swelling in the hands or feet.  Musculoskeletal: Pt reports pain and swelling of left side of jaw. Denies decrease in range of motion, difficulty with gait, muscle pain.  Skin: Denies redness, rashes, lesions or ulcercations.   No other specific complaints in a complete review of systems (except as listed in HPI above).     Objective:   Physical Exam  BP (!) 148/96   Pulse 98   Temp 97.9 F (36.6 C) (Oral)   Wt 218 lb (98.9 kg)   SpO2 98%   BMI 35.19 kg/m   Wt Readings from Last 3 Encounters:  09/18/18 215 lb 12.8 oz (97.9 kg)  09/05/18 210 lb (95.3 kg)  06/04/18 212 lb (96.2 kg)    General: Appears his stated age, obese, in NAD. HEENT: Head: normal shape and size; Throat/Mouth: Teeth present, mucosa pink and moist, no exudate, lesions or ulcerations noted.  Neck:  No adenopathy noted.  Cardiovascular: Normal rate and rhythm. S1,S2 noted.  Murmur noted.  Pulmonary/Chest: Normal effort and positive vesicular breath sounds. No respiratory distress. No wheezes, rales or ronchi noted.  Musculoskeletal: Enlargement of the left parotid gland, blunting of the left lower mandible.  Neurological: Alert and oriented.    BMET    Component Value Date/Time   NA 139 09/05/2018 1034   K 4.4 09/05/2018 1034   CL 102 09/05/2018 1034   CO2 30 09/05/2018 1034   GLUCOSE 164 (H) 09/05/2018 1034   BUN 23 09/05/2018 1034   CREATININE 1.40 09/05/2018 1034   CALCIUM 10.5 09/05/2018 1034   GFRNONAA 68 12/14/2008 1023   GFRAA 82 12/14/2008 1023    Lipid Panel       Component Value Date/Time   CHOL 156 12/16/2018 0827   TRIG 186.0 (H) 12/16/2018 0827   HDL 31.20 (L) 12/16/2018 0827   CHOLHDL 5 12/16/2018 0827   VLDL 37.2 12/16/2018 0827   LDLCALC 87 12/16/2018 0827    CBC    Component Value Date/Time   WBC 5.3 09/05/2018  1034   RBC 4.37 09/05/2018 1034   HGB 13.1 09/05/2018 1034   HCT 38.9 (L) 09/05/2018 1034   PLT 258.0 09/05/2018 1034   MCV 88.9 09/05/2018 1034   MCHC 33.7 09/05/2018 1034   RDW 13.4 09/05/2018 1034   MONOABS 0.4 12/14/2008 1023   EOSABS 0.1 12/14/2008 1023   BASOSABS 0.0 12/14/2008 1023    Hgb A1C Lab Results  Component Value Date   HGBA1C 8.8 (H) 12/16/2018            Assessment & Plan:   Parotitis, Left:  No notes from UC to review Clinically improving 80 mg Depo IM today Continue Clindamycin, Ibuprofen and Tylenol Work note provided  RTC in 3 months for follow up of chronic conditions. Nicki Reaper, NP

## 2018-12-20 NOTE — Addendum Note (Signed)
Addended by: Roena Malady on: 12/20/2018 04:07 PM   Modules accepted: Orders

## 2018-12-23 ENCOUNTER — Telehealth: Payer: Self-pay | Admitting: Internal Medicine

## 2018-12-23 NOTE — Telephone Encounter (Signed)
Pt want to go back to work maybe Thursday 2.27.20. please call pt when note is ready to be picked up

## 2018-12-23 NOTE — Telephone Encounter (Signed)
Left detailed msg on VM per HIPAA Letter placed in front office for pick up

## 2018-12-23 NOTE — Telephone Encounter (Signed)
Can you call and see when he plans to go back.

## 2018-12-23 NOTE — Telephone Encounter (Signed)
Pt stated he need to be out of work few more days. His ears are still hurting. Pt want call back from nurse to discuss.

## 2018-12-24 ENCOUNTER — Telehealth: Payer: Self-pay | Admitting: Internal Medicine

## 2018-12-24 NOTE — Telephone Encounter (Signed)
Best number [phone] Pt called to let you know FYI 2 1/2 days of antibiotic left  Still have discomfort left side of face/ gland  Bottom side of jaw or throat up to the ear.  Throughout taking antibiotic pt has had on and off headaches  And diarrhea  2-3 day    Pt is not taking junivia it is to expensive with applying coupon @  [facility] over 100 monthly.

## 2018-12-25 NOTE — Telephone Encounter (Signed)
At this point, would recommend he follow up with ENT. Does he have an ENT doctor or does he need a referral. Does he know what his formulary covers other than Januvia?

## 2018-12-26 NOTE — Telephone Encounter (Signed)
Pt is aware as instructed and expressed understanding... pt states he will finish Ax and f/u via telephone next week if not better then may want to proceed with ENT referral... pt states he will call insurance to request alternative med for Januvia due to cost and let us know

## 2019-01-06 ENCOUNTER — Ambulatory Visit (INDEPENDENT_AMBULATORY_CARE_PROVIDER_SITE_OTHER): Payer: BLUE CROSS/BLUE SHIELD | Admitting: Internal Medicine

## 2019-01-06 ENCOUNTER — Encounter: Payer: Self-pay | Admitting: Internal Medicine

## 2019-01-06 ENCOUNTER — Other Ambulatory Visit: Payer: Self-pay | Admitting: Internal Medicine

## 2019-01-06 VITALS — BP 160/98 | HR 92 | Temp 98.0°F | Ht 66.0 in | Wt 214.1 lb

## 2019-01-06 DIAGNOSIS — R220 Localized swelling, mass and lump, head: Secondary | ICD-10-CM

## 2019-01-06 DIAGNOSIS — K1120 Sialoadenitis, unspecified: Secondary | ICD-10-CM

## 2019-01-06 DIAGNOSIS — R6884 Jaw pain: Secondary | ICD-10-CM

## 2019-01-06 LAB — CBC
HCT: 39.9 % (ref 39.0–52.0)
Hemoglobin: 13.3 g/dL (ref 13.0–17.0)
MCHC: 33.4 g/dL (ref 30.0–36.0)
MCV: 88 fl (ref 78.0–100.0)
Platelets: 236 10*3/uL (ref 150.0–400.0)
RBC: 4.53 Mil/uL (ref 4.22–5.81)
RDW: 13.7 % (ref 11.5–15.5)
WBC: 5.8 10*3/uL (ref 4.0–10.5)

## 2019-01-06 LAB — COMPREHENSIVE METABOLIC PANEL
ALK PHOS: 68 U/L (ref 39–117)
ALT: 38 U/L (ref 0–53)
AST: 23 U/L (ref 0–37)
Albumin: 4.4 g/dL (ref 3.5–5.2)
BILIRUBIN TOTAL: 0.4 mg/dL (ref 0.2–1.2)
BUN: 10 mg/dL (ref 6–23)
CO2: 24 mEq/L (ref 19–32)
Calcium: 10 mg/dL (ref 8.4–10.5)
Chloride: 100 mEq/L (ref 96–112)
Creatinine, Ser: 1.03 mg/dL (ref 0.40–1.50)
GFR: 88.55 mL/min (ref >60.00)
Glucose, Bld: 229 mg/dL — ABNORMAL HIGH (ref 70–99)
POTASSIUM: 4 meq/L (ref 3.5–5.1)
Sodium: 135 mEq/L (ref 135–145)
Total Protein: 7.1 g/dL (ref 6.0–8.3)

## 2019-01-06 NOTE — Addendum Note (Signed)
Addended by: Luian Schumpert on: 01/06/2019 12:47 PM   Modules accepted: Orders  

## 2019-01-06 NOTE — Addendum Note (Signed)
Addended by: Tawnya Crook on: 01/06/2019 12:56 PM   Modules accepted: Orders

## 2019-01-06 NOTE — Progress Notes (Signed)
Subjective:    Patient ID: Randall Pace, male    DOB: Apr 22, 1956, 63 y.o.   MRN: [id number]  HPI  Pt presents to the clinic today with c/o left sided jaw and ear pain. This has been an ongoing issue. He was seen at [facility] 2/18 for the same. He was diagnosed with parotitis and treated with a 10 day course of Clindamycin. He was seen in the office 2/21, still having some discomfort but it had improved. He requested a note to be out of work, which was provided. He has finished his abx as prescribed. He reports persistent intermittent sharp pain, and swelling. The pain is not worse with chewing. He does not reports that he grinds his teeth. He denies pain in his teeth. He has tried Ibuprofen and Brandy with minimal relief.  Review of Systems  Past Medical History:  Diagnosis Date  . Arthritis    low back  . Diabetes mellitus   . GERD (gastroesophageal reflux disease)   . History of chicken pox   . Hypertension   . Migraine   . Patellofemoral pain syndrome   . Sleep apnea     Current Outpatient Medications  Medication Sig Dispense Refill  . Albuterol Sulfate (PROAIR RESPICLICK) 108 (90 Base) MCG/ACT AEPB Inhale 2 Act into the lungs every 4 (four) hours as needed. 1 each 6  . aspirin 81 MG tablet Take 81 mg by mouth daily.     Marland Kitchen atorvastatin (LIPITOR) 40 MG tablet Take 1 tablet (40 mg total) by mouth daily. (Patient not taking: Reported on 12/20/2018) 90 tablet 3  . cetirizine (ZYRTEC ALLERGY) 10 MG tablet Take 1 tablet (10 mg total) by mouth daily. 30 tablet 6  . clindamycin (CLEOCIN) 300 MG capsule Take 600 mg by mouth 2 (two) times daily.    . fenofibrate 54 MG tablet TAKE 1 TABLET DAILY 90 tablet 0  . glipiZIDE (GLUCOTROL) 10 MG tablet TAKE 1 TABLET TWICE A DAY BEFORE A MEAL 180 tablet 1  . metFORMIN (GLUCOPHAGE) 1000 MG tablet Take 1 tablet (1,000 mg total) by mouth 2 (two) times daily with a meal. 180 tablet 3  . omeprazole (PRILOSEC) 20 MG capsule Take 1 capsule (20 mg total) by  mouth daily. 90 capsule 3  . OVER THE COUNTER MEDICATION     . sitaGLIPtin (JANUVIA) 100 MG tablet Take 1 tablet (100 mg total) by mouth daily. (Patient not taking: Reported on 12/20/2018) 30 tablet 3  . tadalafil (CIALIS) 20 MG tablet Take 0.5 tablets (10 mg total) by mouth daily as needed for erectile dysfunction. (Patient not taking: Reported on 12/20/2018) 30 tablet 3  . triamcinolone (NASACORT) 55 MCG/ACT AERO nasal inhaler Place 1 spray into the nose 2 (two) times daily. 1 Inhaler 2  . triamcinolone ointment (KENALOG) 0.5 % Apply 1 application topically 2 (two) times daily. 30 g 0  . valsartan-hydrochlorothiazide (DIOVAN HCT) 320-25 MG tablet Take 1 tablet by mouth daily. 90 tablet 0  . verapamil (CALAN-SR) 180 MG CR tablet Take 1 tablet (180 mg total) by mouth at bedtime. (Patient not taking: Reported on 12/20/2018) 90 tablet 1   No current facility-administered medications for this visit.     No Known Allergies  Family History  Problem Relation Age of Onset  . Cancer Father        prostate    Social History   Socioeconomic History  . Marital status: Married    Spouse name: Not on file  . Number of  children: 2  . Years of education: Not on file  . Highest education level: Not on file  Occupational History  . Occupation: Psychologist, forensic, [employer]  Social Needs  . Financial resource strain: Not on file  . Food insecurity:    Worry: Not on file    Inability: Not on file  . Transportation needs:    Medical: Not on file    Non-medical: Not on file  Tobacco Use  . Smoking status: Never Smoker  . Smokeless tobacco: Never Used  Substance and Sexual Activity  . Alcohol use: Yes    Comment: occasional  . Drug use: No  . Sexual activity: Not on file  Lifestyle  . Physical activity:    Days per week: Not on file    Minutes per session: Not on file  . Stress: Not on file  Relationships  . Social connections:    Talks on phone: Not on file    Gets together:  Not on file    Attends religious service: Not on file    Active member of club or organization: Not on file    Attends meetings of clubs or organizations: Not on file    Relationship status: Not on file  . Intimate partner violence:    Fear of current or ex partner: Not on file    Emotionally abused: Not on file    Physically abused: Not on file    Forced sexual activity: Not on file  Other Topics Concern  . Not on file  Social History Narrative  . Not on file     Constitutional: Denies fever, malaise, fatigue, headache or abrupt weight changes.  HEENT: Denies eye pain, eye redness, ear pain, ringing in the ears, wax buildup, runny nose, nasal congestion, bloody nose, or sore throat. Respiratory: Denies difficulty breathing, shortness of breath, cough or sputum production.   Cardiovascular: Denies chest pain, chest tightness, palpitations or swelling in the hands or feet.  Musculoskeletal: Pt reports left side jaw pain and swelling. Denies decrease in range of motion, difficulty with gait, muscle pain.  Skin: Denies redness, rashes, lesions or ulcercations.   No other specific complaints in a complete review of systems (except as listed in HPI above).     Objective:   Physical Exam  BP (!) 160/98   Pulse 92   Temp 98 F (36.7 C)   Ht 5\' 6"  (1.676 m)   Wt 214 lb 1.9 oz (97.1 kg)   SpO2 97%   BMI 34.56 kg/m   Wt Readings from Last 3 Encounters:  12/20/18 218 lb (98.9 kg)  09/18/18 215 lb 12.8 oz (97.9 kg)  09/05/18 210 lb (95.3 kg)    General: Appears his stated age, obese in NAD. Skin: Warm, dry and intact. No rashes, redness or warmth noted of left side of face. HEENT: Head: normal shape and size; Ears: Tm's gray and intact, normal light reflex; Nose: mucosa pink and moist, septum midline; Throat/Mouth: Teeth present, mucosa pink and moist, no exudate, lesions or ulcerations noted. Enlargement noted of left parotid gland. Neck:  No adenopathy noted. Cardiovascular:  Normal rate and rhythm. Pulmonary/Chest: Normal effort and positive vesicular breath sounds. No respiratory distress. No wheezes, rales or ronchi noted.  Neurological: Alert and oriented.    BMET    Component Value Date/Time   NA 139 09/05/2018 1034   K 4.4 09/05/2018 1034   CL 102 09/05/2018 1034   CO2 30 09/05/2018 1034   GLUCOSE 164 (H)  09/05/2018 1034   BUN 23 09/05/2018 1034   CREATININE 1.40 09/05/2018 1034   CALCIUM 10.5 09/05/2018 1034   GFRNONAA 68 12/14/2008 1023   GFRAA 82 12/14/2008 1023    Lipid Panel     Component Value Date/Time   CHOL 156 12/16/2018 0827   TRIG 186.0 (H) 12/16/2018 0827   HDL 31.20 (L) 12/16/2018 0827   CHOLHDL 5 12/16/2018 0827   VLDL 37.2 12/16/2018 0827   LDLCALC 87 12/16/2018 0827    CBC    Component Value Date/Time   WBC 5.3 09/05/2018 1034   RBC 4.37 09/05/2018 1034   HGB 13.1 09/05/2018 1034   HCT 38.9 (L) 09/05/2018 1034   PLT 258.0 09/05/2018 1034   MCV 88.9 09/05/2018 1034   MCHC 33.7 09/05/2018 1034   RDW 13.4 09/05/2018 1034   MONOABS 0.4 12/14/2008 1023   EOSABS 0.1 12/14/2008 1023   BASOSABS 0.0 12/14/2008 1023    Hgb A1C Lab Results  Component Value Date   HGBA1C 8.8 (H) 12/16/2018            Assessment & Plan:   Left Side Jaw Pain and Swelling, Parotitis- Unresolved:  CBC and CMET today Will obtain CT maxillofacial Continue Ibuprofen at this time  Will follow up after CT results, return precautions discussed Nicki Reaper, NP

## 2019-01-06 NOTE — Patient Instructions (Signed)
Parotitis  Parotitis means that you have irritation and swelling (inflammation) in one or both of your parotid glands. These glands make saliva. They are found on each side of your face, below and in front of your earlobes. You may or may not have pain with this condition. What are the causes? This condition may be caused by:  Infections from germs (bacteria or viruses).  Something blocking the flow of saliva through the parotid glands. This can be a stone, scar tissue, or a tumor.  Diseases that cause your body's defense system (immune system) to attack healthy cells in your salivary glands. These are called autoimmune diseases. What increases the risk? You are more likely to get this condition if:  You are 50 years old or older.  You do not drink enough fluids (are dehydrated).  You drink too much alcohol.  You have: ? A dry mouth. ? Diabetes. ? Gout. ? A long-term illness.  You do not take good care of your mouth and teeth (poor dental hygiene).  You have had radiation treatments to the head and neck.  You take certain medicines. What are the signs or symptoms? Symptoms of this condition depend on the cause. They may include:  Swelling under and in front of the ear. This may get worse after you eat.  Redness of the skin over the parotid gland.  Pain and tenderness over the parotid gland. This may get worse after you eat.  Fever or chills.  Pus coming from the ducts inside the mouth.  Dry mouth.  A bad taste in the mouth. How is this treated? Treatment for this condition depends on the cause. Treatment may include:  Antibiotic medicine for an infection from bacteria.  Drinking more fluids.  Removing a stone or obstruction.  Treating a disease that is causing parotitis.  Surgery to drain an infection, remove a growth, or remove the whole gland. Treatment may not be needed if the swelling goes away with home care. Follow these instructions at  home: Medicines   Take over-the-counter and prescription medicines only as told by your doctor.  If you were prescribed an antibiotic medicine, take it as told by your doctor. Do not stop taking the antibiotic even if you start to feel better. Managing pain and swelling  If told, put heat on the affected area. Do this as often as told by your doctor. Use the heat source that your doctor recommends, such as a moist heat pack or a heating pad. ? Place a towel between your skin and the heat source. ? Leave the heat on for 20-30 minutes. ? Remove the heat if your skin turns bright red. This is very important if you are unable to feel pain, heat, or cold. You may have a greater risk of getting burned.  Gargle with salt water 3-4 times a day or as needed. To make salt water, dissolve -1 tsp (3-6 g) of salt in 1 cup (237 mL) of warm water.  Gently rub your parotid glands as told by your doctor. General instructions   Drink enough fluid to keep your pee (urine) pale yellow.  Keep your mouth clean and moist.  Suck on sour candy. This may help to: ? Make your mouth less dry. ? Make more saliva.  Take good care of your mouth: ? Brush your teeth at least two times a day. ? Floss your teeth every day. ? See your dentist regularly.  Do not use any products that contain nicotine or   tobacco. These include cigarettes, e-cigarettes, and chewing tobacco. If you need help quitting, ask your doctor.  Do not drink alcohol.  Keep all follow-up visits as told by your doctor. This is important. Contact a doctor if:  You have a fever or chills.  You have new symptoms.  Your symptoms get worse.  Your symptoms do not get better with treatment. Get help right away if:  You have trouble breathing or swallowing. Summary  Parotitis means that you have irritation and swelling (inflammation) in one or both of your parotid glands.  Symptoms include pain and swelling under and in front of the  ear.  Treatment for parotitis depends on the cause. In some cases, the condition may go away on its own with home care.  You should drink plenty of fluids, take good care of your mouth, and avoid tobacco products. This information is not intended to replace advice given to you by your health care provider. Make sure you discuss any questions you have with your health care provider. Document Released: 11/18/2010 Document Revised: 05/14/2018 Document Reviewed: 05/14/2018 Elsevier Interactive Patient Education  2019 Elsevier Inc.  

## 2019-01-06 NOTE — Progress Notes (Signed)
Pre visit review using our clinic review tool, if applicable. No additional management support is needed unless otherwise documented below in the visit note. 

## 2019-01-07 ENCOUNTER — Telehealth: Payer: Self-pay | Admitting: *Deleted

## 2019-01-07 NOTE — Telephone Encounter (Signed)
Yes, would call the dentist and make an appt to be seen.

## 2019-01-07 NOTE — Telephone Encounter (Signed)
Pt walked in checking on his fmla paperwork.  I told him I never received any paperwork. He called them and they will be faxing the paperwork again They had our phone number as fax number   He also stated he went to the dentist today and they found that he has a cracked tooth.

## 2019-01-07 NOTE — Telephone Encounter (Signed)
Pt left v/m that pt saw dentist and pt has a crack in rear molar and dentist thinks that could have caused infection. Pt has appt on 01/30/19 for extraction and is on abx and vicodin at night for pain. Just FYI for Pamala Hurry NP.

## 2019-01-07 NOTE — Telephone Encounter (Signed)
Patient called stating that he was seen yesterday because of jaw pain. Patient stated that he thinks that it may be his back tooth. Patient stated that he put his finger in his mouth this morning and pressed down on his tooth and the pain was excruciating. Patient wants to know if Nicki Reaper NP thinks that he should call his dentist?

## 2019-01-07 NOTE — Telephone Encounter (Signed)
Does he want to cancel CT scan?

## 2019-01-07 NOTE — Telephone Encounter (Signed)
Left detailed msg on VM per HIPAA  

## 2019-01-08 ENCOUNTER — Telehealth: Payer: Self-pay | Admitting: Internal Medicine

## 2019-01-08 NOTE — Telephone Encounter (Signed)
Patient called to ask [place] to cancel the Maxillofacial CT he said he couldn't afford the CT right now. Also that he saw his Dentist yesterday and he has a cracked tooth on the left back side that the Dentist thinks is the cause of the pain and swelling. They put him on Clindamycin . I have cancelled the CT Scan.

## 2019-01-08 NOTE — Telephone Encounter (Signed)
fmla paperwork in Randall Pace's in box for review and signature °

## 2019-01-08 NOTE — Telephone Encounter (Signed)
Noted, thank you

## 2019-01-09 NOTE — Telephone Encounter (Signed)
Denied. He only has a cracked tooth. He needs to go back to work

## 2019-01-09 NOTE — Telephone Encounter (Signed)
Paperwork in regina's in box 

## 2019-01-09 NOTE — Telephone Encounter (Signed)
Done, placed in my outbox.

## 2019-01-09 NOTE — Telephone Encounter (Signed)
Pt called stated he saw you on 3/9 and was giving more antibiotic and wants to return to work on 01/13/19.  Pt stated he was back to square on with infection and was given same antibiotic   He saw dentisit on 3/10.  They will pull tooth 01/29/2019  Please initial and date

## 2019-01-09 NOTE — Telephone Encounter (Signed)
Paperwork faxed °

## 2019-01-10 ENCOUNTER — Telehealth: Payer: Self-pay | Admitting: Internal Medicine

## 2019-01-10 NOTE — Telephone Encounter (Signed)
Pt dropped off letter concerning [place] ppw. He said it is crucial you read this Friday 3/13. He is requesting a call once letter is read.

## 2019-01-10 NOTE — Telephone Encounter (Signed)
Best number [phone]  I spoke to pt he wanted to know why Rene Kocher denied the extra time he wanted for [facility].  He stated he knows he had a cracked tooth but he also is being treated for infection.  Melanie Can you talk to pt about this.  He wouldn't take no  For theextra time for me

## 2019-01-10 NOTE — Telephone Encounter (Signed)
Left message asking pt to call office  Copy for pt Copy for scan

## 2019-01-10 NOTE — Telephone Encounter (Signed)
There is a difference between parotitis and an abscessed tooth. Abscessed teeth do not cause parotitis. The abscessed tooth was confirmed by your dentist. Parotitis was never a confirmed diagnosis because you decided to cancel the CT scan. You should be clinically stable to return to work. It sounds like your main concern is exposure to coronavirus at work, based on your concern about the CDC recommendations. Maybe your dentist would be willing to write a note or complete the FMLA for you to be out of work, but it is my recommendation that you return.

## 2019-01-13 ENCOUNTER — Other Ambulatory Visit: Payer: BLUE CROSS/BLUE SHIELD

## 2019-01-13 NOTE — Telephone Encounter (Signed)
Pt called in stating that it was not just the Sx from infection or cracked tooth, he also reports he was having a lot of pain and GI Sx likely he thinks related to the Ax... pt would like PPW to cover 01/06/2019 to return on 01/12/2019... please advise

## 2019-01-13 NOTE — Telephone Encounter (Signed)
Pt calling concerning  Previous messages that has been sent and and pt stated he hasn't received a call back. Pt is very upset. Please call pt

## 2019-01-13 NOTE — Telephone Encounter (Signed)
Pt left v/m requesting cb about letter left on 01/10/19. Pt left v/m ;that he is returning to work today.Please advise.

## 2019-01-14 NOTE — Telephone Encounter (Signed)
He mentioned swelling and discomfort, not severe pain. This is also the first I have heard about the GI upset due to abx. Maybe he should see if his dentist will fill out the paperwork for the additional days he didn't go to work.

## 2019-01-15 NOTE — Telephone Encounter (Signed)
Left detailed msg on VM per HIPAA, Rene Kocher will not extend dates, pt should contact dentist to have them fill out new form as pt saw them and they treated patient the 2nd time.

## 2019-02-11 ENCOUNTER — Other Ambulatory Visit: Payer: Self-pay | Admitting: Internal Medicine

## 2019-02-20 ENCOUNTER — Other Ambulatory Visit: Payer: Self-pay | Admitting: Internal Medicine

## 2019-04-22 ENCOUNTER — Other Ambulatory Visit: Payer: Self-pay | Admitting: Internal Medicine

## 2019-07-04 ENCOUNTER — Other Ambulatory Visit: Payer: Self-pay | Admitting: Internal Medicine

## 2019-07-08 LAB — HM DIABETES EYE EXAM

## 2019-07-10 ENCOUNTER — Encounter: Payer: Self-pay | Admitting: Internal Medicine

## 2019-07-21 ENCOUNTER — Other Ambulatory Visit: Payer: Self-pay | Admitting: Internal Medicine

## 2019-07-23 ENCOUNTER — Other Ambulatory Visit: Payer: Self-pay | Admitting: Internal Medicine

## 2019-07-28 ENCOUNTER — Other Ambulatory Visit: Payer: Self-pay | Admitting: Internal Medicine

## 2019-08-28 DIAGNOSIS — R0789 Other chest pain: Secondary | ICD-10-CM | POA: Diagnosis not present

## 2019-09-30 ENCOUNTER — Ambulatory Visit (INDEPENDENT_AMBULATORY_CARE_PROVIDER_SITE_OTHER): Payer: BC Managed Care – PPO | Admitting: Internal Medicine

## 2019-09-30 ENCOUNTER — Other Ambulatory Visit: Payer: Self-pay

## 2019-09-30 ENCOUNTER — Encounter: Payer: Self-pay | Admitting: Internal Medicine

## 2019-09-30 VITALS — BP 162/94 | HR 81 | Temp 98.2°F | Ht 65.75 in | Wt 204.0 lb

## 2019-09-30 DIAGNOSIS — Z0000 Encounter for general adult medical examination without abnormal findings: Secondary | ICD-10-CM | POA: Diagnosis not present

## 2019-09-30 DIAGNOSIS — M1990 Unspecified osteoarthritis, unspecified site: Secondary | ICD-10-CM

## 2019-09-30 DIAGNOSIS — Z125 Encounter for screening for malignant neoplasm of prostate: Secondary | ICD-10-CM | POA: Diagnosis not present

## 2019-09-30 DIAGNOSIS — Z91148 Patient's other noncompliance with medication regimen for other reason: Secondary | ICD-10-CM | POA: Insufficient documentation

## 2019-09-30 DIAGNOSIS — E1140 Type 2 diabetes mellitus with diabetic neuropathy, unspecified: Secondary | ICD-10-CM | POA: Diagnosis not present

## 2019-09-30 DIAGNOSIS — I10 Essential (primary) hypertension: Secondary | ICD-10-CM

## 2019-09-30 DIAGNOSIS — N521 Erectile dysfunction due to diseases classified elsewhere: Secondary | ICD-10-CM | POA: Diagnosis not present

## 2019-09-30 DIAGNOSIS — Z23 Encounter for immunization: Secondary | ICD-10-CM

## 2019-09-30 DIAGNOSIS — K219 Gastro-esophageal reflux disease without esophagitis: Secondary | ICD-10-CM

## 2019-09-30 DIAGNOSIS — G4733 Obstructive sleep apnea (adult) (pediatric): Secondary | ICD-10-CM

## 2019-09-30 DIAGNOSIS — Z9114 Patient's other noncompliance with medication regimen: Secondary | ICD-10-CM | POA: Diagnosis not present

## 2019-09-30 DIAGNOSIS — E7800 Pure hypercholesterolemia, unspecified: Secondary | ICD-10-CM

## 2019-09-30 LAB — CBC
HCT: 42.1 % (ref 39.0–52.0)
Hemoglobin: 14.1 g/dL (ref 13.0–17.0)
MCHC: 33.6 g/dL (ref 30.0–36.0)
MCV: 89.5 fl (ref 78.0–100.0)
Platelets: 254 10*3/uL (ref 150.0–400.0)
RBC: 4.7 Mil/uL (ref 4.22–5.81)
RDW: 13.3 % (ref 11.5–15.5)
WBC: 5.4 10*3/uL (ref 4.0–10.5)

## 2019-09-30 LAB — HEMOGLOBIN A1C: Hgb A1c MFr Bld: 11.8 % — ABNORMAL HIGH (ref 4.6–6.5)

## 2019-09-30 LAB — COMPREHENSIVE METABOLIC PANEL
ALT: 21 U/L (ref 0–53)
AST: 16 U/L (ref 0–37)
Albumin: 4.4 g/dL (ref 3.5–5.2)
Alkaline Phosphatase: 92 U/L (ref 39–117)
BUN: 15 mg/dL (ref 6–23)
CO2: 29 mEq/L (ref 19–32)
Calcium: 10.2 mg/dL (ref 8.4–10.5)
Chloride: 99 mEq/L (ref 96–112)
Creatinine, Ser: 1.08 mg/dL (ref 0.40–1.50)
GFR: 83.63 mL/min (ref >60.00)
Glucose, Bld: 278 mg/dL — ABNORMAL HIGH (ref 70–99)
Potassium: 4.6 mEq/L (ref 3.5–5.1)
Sodium: 136 mEq/L (ref 135–145)
Total Bilirubin: 0.5 mg/dL (ref 0.2–1.2)
Total Protein: 7 g/dL (ref 6.0–8.3)

## 2019-09-30 LAB — PSA: PSA: 1.62 ng/mL (ref 0.10–4.00)

## 2019-09-30 LAB — LIPID PANEL
Cholesterol: 140 mg/dL (ref 0–200)
HDL: 35.9 mg/dL — ABNORMAL LOW (ref >39.00)
LDL Cholesterol: 84 mg/dL (ref 0–99)
NonHDL: 104.36
Total CHOL/HDL Ratio: 4
Triglycerides: 101 mg/dL (ref 0.0–149.0)
VLDL: 20.2 mg/dL (ref 0.0–40.0)

## 2019-09-30 MED ORDER — SILDENAFIL CITRATE 50 MG PO TABS: 50.0000 mg | ORAL_TABLET | Freq: Every day | ORAL | 0 refills | Status: DC | PRN

## 2019-09-30 MED ORDER — TIZANIDINE HCL 4 MG PO CAPS: 4.0000 mg | ORAL_CAPSULE | Freq: Every evening | ORAL | 0 refills | Status: DC | PRN

## 2019-09-30 NOTE — Assessment & Plan Note (Signed)
CMET today We had a long discussion regarding medication noncompliance Discussed risk for heart attack, stroke and death if he continues to be noncompliant Reinforced DASH diet and exercise for weight loss Continue Valsartan HCT and Verapamil

## 2019-09-30 NOTE — Assessment & Plan Note (Signed)
Encouraged regular stretching and physical activity Discussed how weight loss could help improve joint pain Continue Tylenol/Ibuprofen OTC

## 2019-09-30 NOTE — Assessment & Plan Note (Signed)
CMET and Lipid profile today Encouraged him to take his Atorvastatin as prescribed Advised him to consume a low fat diet

## 2019-09-30 NOTE — Progress Notes (Signed)
Subjective:    Patient ID: Randall Pace, male    DOB: [birth year], 63 y.o.   MRN: [id number]  HPI  Pt presents to the clinic today for his annual exam. He is also due to follow up chronic conditions.   OA: Mainly in hiis lower back. He does not take any scheduled medication for this but controls it with OTC pain meds.   DM 2 with Neuropathy: His last A1C was 8.8 on 11/2018. He has not been taking Metformin or Glipizide as prescribed because he "just can't remember". He does not check his sugars. He does not check his feet routinely. His last eye exam was about 6 months ago.  ED: He has difficulty initiating erections. He is unable to afford the Cialis and wonders if there is anything cheaper.   HTN: BP today is 162/94. He is taking Valsartan-HCT and Verapamil "when he remembers". ECG from 08/2018 reviewed.  HLD: His last LDL was 87, Triglycerides 186, 11/2018. He takes Atorvastatin and Fenofibrate "when he remembers". He tries to consume a low fat diet.  GERD: He is not sure what triggers this. He has not been taking Omeprazole regularly. There is no upper GI on file.  OSA: He averages less than 4-5 hours of sleep per night. He has a CPAP but has not used it in years. His last sleep study was more than 5 years ago.   Flu: never Tetanus: unsure Pneumovax: never Zostovax: never Shingrix: never PSA Screening: 08/2018 Colon Screening: 03/2016, 5 years Vision Screening: annually  Dentist: biannually  Diet: He eats meats. He consumes fruits and vegetables daily. He eats fried foods. He drinks mostly water and occasionally soda. Exercise: None  Review of Systems      Past Medical History:  Diagnosis Date  . Arthritis    low back  . Diabetes mellitus   . GERD (gastroesophageal reflux disease)   . History of chicken pox   . Hypertension   . Migraine   . Patellofemoral pain syndrome   . Sleep apnea     Current Outpatient Medications  Medication Sig Dispense Refill  .  Albuterol Sulfate (PROAIR RESPICLICK) 409 (90 Base) MCG/ACT AEPB Inhale 2 Act into the lungs every 4 (four) hours as needed. 1 each 6  . aspirin 81 MG tablet Take 81 mg by mouth daily.     Marland Kitchen atorvastatin (LIPITOR) 40 MG tablet Take 1 tablet (40 mg total) by mouth daily. 90 tablet 3  . cetirizine (ZYRTEC ALLERGY) 10 MG tablet Take 1 tablet (10 mg total) by mouth daily. 30 tablet 6  . clindamycin (CLEOCIN) 150 MG capsule TK ONE C PO QID UNTIL COMPLETE    . clindamycin (CLEOCIN) 300 MG capsule Take 600 mg by mouth 2 (two) times daily.    . fenofibrate 54 MG tablet TAKE 1 TABLET DAILY 90 tablet 0  . glipiZIDE (GLUCOTROL) 10 MG tablet TAKE 1 TABLET TWICE A DAY BEFORE A MEAL 180 tablet 1  . HYDROcodone-acetaminophen (NORCO/VICODIN) 5-325 MG tablet TK 1 T PO Q 4 TO 6 H PRF PAIN    . metFORMIN (GLUCOPHAGE) 1000 MG tablet TAKE 1 TABLET TWICE A DAY WITH MEALS 180 tablet 0  . omeprazole (PRILOSEC) 20 MG capsule Take 1 capsule (20 mg total) by mouth daily. MUST SCHEDULE PHYSICAL FOR REFILLS 90 capsule 0  . OVER THE COUNTER MEDICATION     . sitaGLIPtin (JANUVIA) 100 MG tablet Take 1 tablet (100 mg total) by mouth daily. 30 tablet 3  .  tadalafil (CIALIS) 20 MG tablet Take 0.5 tablets (10 mg total) by mouth daily as needed for erectile dysfunction. 30 tablet 3  . triamcinolone (NASACORT) 55 MCG/ACT AERO nasal inhaler Place 1 spray into the nose 2 (two) times daily. 1 Inhaler 2  . triamcinolone ointment (KENALOG) 0.5 % Apply 1 application topically 2 (two) times daily. 30 g 0  . valsartan-hydrochlorothiazide (DIOVAN-HCT) 320-25 MG tablet Take 1 tablet by mouth daily. SCHEDULE PHYSICAL FOR November 90 tablet 0  . verapamil (CALAN-SR) 180 MG CR tablet Take 1 tablet (180 mg total) by mouth at bedtime. MUST SCHEDULE PHYSICAL EXAM 90 tablet 0   No current facility-administered medications for this visit.     No Known Allergies  Family History  Problem Relation Age of Onset  . Cancer Father        prostate     Social History   Socioeconomic History  . Marital status: Married    Spouse name: Not on file  . Number of children: 2  . Years of education: Not on file  . Highest education level: Not on file  Occupational History  . Occupation: Psychologist, forensic, [employer]  Social Needs  . Financial resource strain: Not on file  . Food insecurity    Worry: Not on file    Inability: Not on file  . Transportation needs    Medical: Not on file    Non-medical: Not on file  Tobacco Use  . Smoking status: Never Smoker  . Smokeless tobacco: Never Used  Substance and Sexual Activity  . Alcohol use: Yes    Comment: occasional  . Drug use: No  . Sexual activity: Not on file  Lifestyle  . Physical activity    Days per week: Not on file    Minutes per session: Not on file  . Stress: Not on file  Relationships  . Social Musician on phone: Not on file    Gets together: Not on file    Attends religious service: Not on file    Active member of club or organization: Not on file    Attends meetings of clubs or organizations: Not on file    Relationship status: Not on file  . Intimate partner violence    Fear of current or ex partner: Not on file    Emotionally abused: Not on file    Physically abused: Not on file    Forced sexual activity: Not on file  Other Topics Concern  . Not on file  Social History Narrative  . Not on file     Constitutional: Pt reports fatigue. Denies fever, malaise, headache or abrupt weight changes.  HEENT: Denies eye pain, eye redness, ear pain, ringing in the ears, wax buildup, runny nose, nasal congestion, bloody nose, or sore throat. Respiratory: Denies difficulty breathing, shortness of breath, cough or sputum production.   Cardiovascular: Denies chest pain, chest tightness, palpitations or swelling in the hands or feet.  Gastrointestinal: Pt reports intermittent reflux. Denies abdominal pain, bloating, constipation, diarrhea or blood in  the stool.  GU: Denies urgency, frequency, pain with urination, burning sensation, blood in urine, odor or discharge. Musculoskeletal: Pt reports muscle pain right side chest wall, intermittent low back pain. Denies decrease in range of motion, difficulty with gait, or joint pain and swelling.  Skin: Denies redness, rashes, lesions or ulcercations.  Neurological: Denies dizziness, difficulty with memory, difficulty with speech or problems with balance and coordination.  Psych: Denies anxiety, depression, SI/HI.  No other specific complaints in a complete review of systems (except as listed in HPI above).  Objective:   Physical Exam  BP (!) 162/94   Pulse 81   Temp 98.2 F (36.8 C) (Temporal)   Ht 5' 5.75" (1.67 m)   Wt 204 lb (92.5 kg)   SpO2 98%   BMI 33.18 kg/m   Wt Readings from Last 3 Encounters:  01/06/19 214 lb 1.9 oz (97.1 kg)  12/20/18 218 lb (98.9 kg)  09/18/18 215 lb 12.8 oz (97.9 kg)    General: Appears his stated age, obese, in NAD. Skin: Warm, dry and intact. No rashes, or ulcerations noted. HEENT: Head: normal shape and size; Eyes: sclera white, no icterus, conjunctiva pink and EOMs intact Neck:  Neck supple, trachea midline. No masses, lumps or thyromegaly present.  Cardiovascular:  Normal rate and rhythm. Murmur noted. No JVD or BLE edema. Pulmonary/Chest: Normal effort and positive vesicular breath sounds. No respiratory distress. No wheezes, rales or ronchi noted.  Abdomen: Soft and nontender. Normal bowel sounds. No distention or masses noted.  Musculoskeletal: Minor pain to palpation of right upper back, right chest wall. Strength 5/5 BUE/BLE. No difficulty with gait.  Neurological: Alert and oriented. Cranial nerves II-XII grossly intact. Coordination normal.  Psychiatric: Mood and affect normal. Behavior is normal. Judgment and thought content normal.     BMET    Component Value Date/Time   NA 135 01/06/2019 1214   K 4.0 01/06/2019 1214   CL 100  01/06/2019 1214   CO2 24 01/06/2019 1214   GLUCOSE 229 (H) 01/06/2019 1214   BUN 10 01/06/2019 1214   CREATININE 1.03 01/06/2019 1214   CALCIUM 10.0 01/06/2019 1214   GFRNONAA 68 12/14/2008 1023   GFRAA 82 12/14/2008 1023    Lipid Panel     Component Value Date/Time   CHOL 156 12/16/2018 0827   TRIG 186.0 (H) 12/16/2018 0827   HDL 31.20 (L) 12/16/2018 0827   CHOLHDL 5 12/16/2018 0827   VLDL 37.2 12/16/2018 0827   LDLCALC 87 12/16/2018 0827    CBC    Component Value Date/Time   WBC 5.8 01/06/2019 1214   RBC 4.53 01/06/2019 1214   HGB 13.3 01/06/2019 1214   HCT 39.9 01/06/2019 1214   PLT 236.0 01/06/2019 1214   MCV 88.0 01/06/2019 1214   MCHC 33.4 01/06/2019 1214   RDW 13.7 01/06/2019 1214   MONOABS 0.4 12/14/2008 1023   EOSABS 0.1 12/14/2008 1023   BASOSABS 0.0 12/14/2008 1023    Hgb A1C Lab Results  Component Value Date   HGBA1C 8.8 (H) 12/16/2018           Assessment & Plan:  Preventative Health Maintenance:  He declines flu shot, pneumovax and shingrix today Tdap today Colon screening UTD Encouraged him to consume a balanced diet and exercise regimen Advised him to see an eye doctor and dentist annually Will check CBC, CMET, Lipid, A1c and PSA.   Chronic Muscle Strain of Right Chest Wall:  Encouraged heat and stretching RX for Zanaflex 4 mg PO QHS prn - sedation caution given  RTC in 3 months, to follow up chronic conditions Nicki Reaperegina Baity, NP This visit occurred during the SARS-CoV-2 public health emergency.  Safety protocols were in place, including screening questions prior to the visit, additional usage of staff PPE, and extensive cleaning of exam room while observing appropriate contact time as indicated for disinfecting solutions.

## 2019-09-30 NOTE — Assessment & Plan Note (Signed)
A1C today No urine microalbumin secondary to ARB therapy Had a long discussion about medication compliance- I set up an alarm on his phone to remind him to take his medication Encouraged him to consume a low carb diet and exercise for weight loss Continue Metformin and Glipizide Foot exam today Encouraged yearly eye exam He declines flu and pneumovax

## 2019-09-30 NOTE — Patient Instructions (Signed)

## 2019-09-30 NOTE — Assessment & Plan Note (Addendum)
Cialis d/c'd Will trial Sildenafil 50 mg prn Discussed the importance of well controlled BP, diabetes and weight loss and how this could be affecting his ED

## 2019-09-30 NOTE — Assessment & Plan Note (Signed)
Discussed how weight loss could help reduce his symptoms Encouraged him to take Omeprazole as prescribed to reduce risk of esophageal cancer CBC and CMET today Will monitor

## 2019-09-30 NOTE — Assessment & Plan Note (Signed)
He is not wearing his CPAP Discussed how weight loss could help improve symptoms  He declines referral to pulmonology at this time for repeat sleep study or to obtain a new machine

## 2019-10-02 ENCOUNTER — Other Ambulatory Visit: Payer: Self-pay | Admitting: Internal Medicine

## 2019-10-20 ENCOUNTER — Other Ambulatory Visit: Payer: Self-pay | Admitting: Internal Medicine

## 2019-10-21 ENCOUNTER — Other Ambulatory Visit: Payer: Self-pay | Admitting: Internal Medicine

## 2019-10-21 MED ORDER — ONETOUCH ULTRA MINI W/DEVICE KIT: 1.0000 | PACK | Freq: Once | 0 refills | Status: AC

## 2019-10-21 MED ORDER — LANCETS 30G MISC: 1.0000 | Freq: Two times a day (BID) | 1 refills | Status: DC

## 2019-10-21 MED ORDER — ONETOUCH ULTRA VI STRP: 1.0000 | ORAL_STRIP | Freq: Two times a day (BID) | 1 refills | Status: DC

## 2019-10-21 NOTE — Addendum Note (Signed)
Addended by: Lurlean Nanny on: 10/21/2019 04:10 PM   Modules accepted: Orders

## 2019-10-26 ENCOUNTER — Other Ambulatory Visit: Payer: Self-pay | Admitting: Internal Medicine

## 2019-11-09 ENCOUNTER — Telehealth: Payer: Self-pay | Admitting: Internal Medicine

## 2019-11-10 NOTE — Telephone Encounter (Signed)
Last filled 11/19/2018.... please advise 

## 2019-12-01 NOTE — Telephone Encounter (Signed)
Patient is calling in regards to refill request. He stated that the pharmacy did not receive the refill request and advised him to call our office to see why.   I see that it was sent on the 1/11 but he stated they do not have a refill for him

## 2019-12-03 ENCOUNTER — Telehealth: Payer: Self-pay

## 2019-12-03 NOTE — Telephone Encounter (Signed)
Received msg from after hours calls. Pt reports he is still waiting for authorization for his sildenafil.  Contacted [facility] and they report they have faxed the request but have not heard anything from this office and the medication will still not go through. Pt told pharmacy that this has been approved by insurance in the past. They are faxing another request to start PA.

## 2019-12-03 NOTE — Telephone Encounter (Signed)
PA has been submitted via [email or web address] awaiting response 

## 2019-12-03 NOTE — Telephone Encounter (Signed)
Received fax with approval through 12/02/2020 and has been faxed to the pharmacy

## 2020-01-03 ENCOUNTER — Other Ambulatory Visit: Payer: Self-pay | Admitting: Internal Medicine

## 2020-01-05 NOTE — Telephone Encounter (Signed)
Last filled 11/10/2019.... please advise  

## 2020-01-13 ENCOUNTER — Ambulatory Visit: Payer: BC Managed Care – PPO | Admitting: Internal Medicine

## 2020-01-13 DIAGNOSIS — Z0289 Encounter for other administrative examinations: Secondary | ICD-10-CM

## 2020-01-13 NOTE — Progress Notes (Deleted)
Subjective:    Patient ID: Randall Pace, male    DOB: 1956-01-16, 64 y.o.   MRN: [id number]  HPI  Pt presents to the clinic today for follow up of DM 2. His last A1C was 11.8%, 09/2019. He is taking Metformin and Glipizide as prescribed. His sugars range. He is on Valsartan for renal protection and Atorvastatin, Fenofibrate for lipid management. His last eye exam was. He checks his feet routinely. His last eye exam was.  Review of Systems      Past Medical History:  Diagnosis Date  . Arthritis    low back  . Diabetes mellitus   . GERD (gastroesophageal reflux disease)   . History of chicken pox   . Hypertension   . Migraine   . Patellofemoral pain syndrome   . Sleep apnea     Current Outpatient Medications  Medication Sig Dispense Refill  . aspirin 81 MG tablet Take 81 mg by mouth daily.     Marland Kitchen atorvastatin (LIPITOR) 40 MG tablet Take 1 tablet (40 mg total) by mouth daily. 90 tablet 3  . cetirizine (ZYRTEC ALLERGY) 10 MG tablet Take 1 tablet (10 mg total) by mouth daily. 30 tablet 6  . fenofibrate 54 MG tablet TAKE 1 TABLET DAILY 90 tablet 2  . glipiZIDE (GLUCOTROL) 10 MG tablet TAKE 1 TABLET TWICE A DAY BEFORE MEALS 180 tablet 1  . glucose blood (ONETOUCH ULTRA) test strip 1 each by Other route 2 (two) times daily. 200 each 1  . Lancets 30G MISC 1 each by Does not apply route 2 (two) times daily. 200 each 1  . metFORMIN (GLUCOPHAGE) 1000 MG tablet TAKE 1 TABLET TWICE A DAY WITH MEALS 180 tablet 0  . omeprazole (PRILOSEC) 20 MG capsule Take 1 capsule (20 mg total) by mouth daily. 90 capsule 2  . OVER THE COUNTER MEDICATION     . sildenafil (VIAGRA) 50 MG tablet TAKE 1 TABLET(50 MG) BY MOUTH DAILY AS NEEDED FOR ERECTILE DYSFUNCTION 10 tablet 0  . tiZANidine (ZANAFLEX) 4 MG capsule Take 1 capsule (4 mg total) by mouth at bedtime as needed for muscle spasms. 20 capsule 0  . triamcinolone ointment (KENALOG) 0.5 % Apply 1 application topically 2 (two) times daily. 30 g 0  .  valsartan-hydrochlorothiazide (DIOVAN-HCT) 320-25 MG tablet Take 1 tablet by mouth daily. 90 tablet 2  . verapamil (CALAN-SR) 180 MG CR tablet Take 1 tablet (180 mg total) by mouth at bedtime. MUST SCHEDULE PHYSICAL EXAM 90 tablet 0   No current facility-administered medications for this visit.    No Known Allergies  Family History  Problem Relation Age of Onset  . Cancer Father        prostate    Social History   Socioeconomic History  . Marital status: Married    Spouse name: Not on file  . Number of children: 2  . Years of education: Not on file  . Highest education level: Not on file  Occupational History  . Occupation: Psychologist, forensic, [employer]  Tobacco Use  . Smoking status: Never Smoker  . Smokeless tobacco: Never Used  Substance and Sexual Activity  . Alcohol use: Yes    Comment: occasional  . Drug use: No  . Sexual activity: Not on file  Other Topics Concern  . Not on file  Social History Narrative  . Not on file   Social Determinants of Health   Financial Resource Strain:   . Difficulty of Paying Living Expenses:  Food Insecurity:   . Worried About Programme researcher, broadcasting/film/video in the Last Year:   . Barista in the Last Year:   Transportation Needs:   . Freight forwarder (Medical):   Marland Kitchen Lack of Transportation (Non-Medical):   Physical Activity:   . Days of Exercise per Week:   . Minutes of Exercise per Session:   Stress:   . Feeling of Stress :   Social Connections:   . Frequency of Communication with Friends and Family:   . Frequency of Social Gatherings with Friends and Family:   . Attends Religious Services:   . Active Member of Clubs or Organizations:   . Attends Banker Meetings:   Marland Kitchen Marital Status:   Intimate Partner Violence:   . Fear of Current or Ex-Partner:   . Emotionally Abused:   Marland Kitchen Physically Abused:   . Sexually Abused:      Constitutional: Denies fever, malaise, fatigue, headache or abrupt weight  changes.  HEENT: Denies eye pain, eye redness, ear pain, ringing in the ears, wax buildup, runny nose, nasal congestion, bloody nose, or sore throat. Respiratory: Denies difficulty breathing, shortness of breath, cough or sputum production.   Cardiovascular: Denies chest pain, chest tightness, palpitations or swelling in the hands or feet.  Gastrointestinal: Denies abdominal pain, bloating, constipation, diarrhea or blood in the stool.  GU: Denies urgency, frequency, pain with urination, burning sensation, blood in urine, odor or discharge. Musculoskeletal: Denies decrease in range of motion, difficulty with gait, muscle pain or joint pain and swelling.  Skin: Denies redness, rashes, lesions or ulcercations.  Neurological: Denies dizziness, difficulty with memory, difficulty with speech or problems with balance and coordination.  Psych: Denies anxiety, depression, SI/HI.  No other specific complaints in a complete review of systems (except as listed in HPI above).  Objective:   Physical Exam   There were no vitals taken for this visit. Wt Readings from Last 3 Encounters:  09/30/19 204 lb (92.5 kg)  01/06/19 214 lb 1.9 oz (97.1 kg)  12/20/18 218 lb (98.9 kg)    General: Appears their stated age, well developed, well nourished in NAD. Skin: Warm, dry and intact. No rashes, lesions or ulcerations noted. HEENT: Head: normal shape and size; Eyes: sclera white, no icterus, conjunctiva pink, PERRLA and EOMs intact; Ears: Tm's gray and intact, normal light reflex; Nose: mucosa pink and moist, septum midline; Throat/Mouth: Teeth present, mucosa pink and moist, no exudate, lesions or ulcerations noted.  Neck:  Neck supple, trachea midline. No masses, lumps or thyromegaly present.  Cardiovascular: Normal rate and rhythm. S1,S2 noted.  No murmur, rubs or gallops noted. No JVD or BLE edema. No carotid bruits noted. Pulmonary/Chest: Normal effort and positive vesicular breath sounds. No respiratory  distress. No wheezes, rales or ronchi noted.  Abdomen: Soft and nontender. Normal bowel sounds. No distention or masses noted. Liver, spleen and kidneys non palpable. Musculoskeletal: Normal range of motion. No signs of joint swelling. No difficulty with gait.  Neurological: Alert and oriented. Cranial nerves II-XII grossly intact. Coordination normal.  Psychiatric: Mood and affect normal. Behavior is normal. Judgment and thought content normal.   EKG:  BMET    Component Value Date/Time   NA 136 09/30/2019 1109   K 4.6 09/30/2019 1109   CL 99 09/30/2019 1109   CO2 29 09/30/2019 1109   GLUCOSE 278 (H) 09/30/2019 1109   BUN 15 09/30/2019 1109   CREATININE 1.08 09/30/2019 1109   CALCIUM 10.2 09/30/2019  1109   GFRNONAA 68 12/14/2008 1023   GFRAA 82 12/14/2008 1023    Lipid Panel     Component Value Date/Time   CHOL 140 09/30/2019 1109   TRIG 101.0 09/30/2019 1109   HDL 35.90 (L) 09/30/2019 1109   CHOLHDL 4 09/30/2019 1109   VLDL 20.2 09/30/2019 1109   LDLCALC 84 09/30/2019 1109    CBC    Component Value Date/Time   WBC 5.4 09/30/2019 1109   RBC 4.70 09/30/2019 1109   HGB 14.1 09/30/2019 1109   HCT 42.1 09/30/2019 1109   PLT 254.0 09/30/2019 1109   MCV 89.5 09/30/2019 1109   MCHC 33.6 09/30/2019 1109   RDW 13.3 09/30/2019 1109   MONOABS 0.4 12/14/2008 1023   EOSABS 0.1 12/14/2008 1023   BASOSABS 0.0 12/14/2008 1023    Hgb A1C Lab Results  Component Value Date   HGBA1C 11.8 (H) 09/30/2019           Assessment & Plan:

## 2020-01-15 ENCOUNTER — Ambulatory Visit (INDEPENDENT_AMBULATORY_CARE_PROVIDER_SITE_OTHER): Payer: BC Managed Care – PPO | Admitting: Internal Medicine

## 2020-01-15 ENCOUNTER — Encounter: Payer: Self-pay | Admitting: Internal Medicine

## 2020-01-15 ENCOUNTER — Other Ambulatory Visit: Payer: Self-pay

## 2020-01-15 VITALS — BP 144/84 | HR 81 | Temp 97.9°F | Wt 214.0 lb

## 2020-01-15 DIAGNOSIS — E7800 Pure hypercholesterolemia, unspecified: Secondary | ICD-10-CM | POA: Diagnosis not present

## 2020-01-15 DIAGNOSIS — E1140 Type 2 diabetes mellitus with diabetic neuropathy, unspecified: Secondary | ICD-10-CM | POA: Diagnosis not present

## 2020-01-15 DIAGNOSIS — I10 Essential (primary) hypertension: Secondary | ICD-10-CM | POA: Diagnosis not present

## 2020-01-15 LAB — POCT GLYCOSYLATED HEMOGLOBIN (HGB A1C): Hemoglobin A1C: 9.9 % — AB (ref 4.0–5.6)

## 2020-01-15 MED ORDER — SITAGLIPTIN PHOSPHATE 100 MG PO TABS: 100.0000 mg | ORAL_TABLET | Freq: Every day | ORAL | 2 refills | Status: DC

## 2020-01-15 NOTE — Assessment & Plan Note (Signed)
POCT A1C 9.9% Continue Metformin and Glipizide Add Januvia 100 mg daily Monitor sugars Encouraged low carb diet and exercise for weight loss Offered referral to nutrition/medical weight management Encouraged him to schedule his eye exam Encouraged regular foot inspections He declines flu or pneumovax

## 2020-01-15 NOTE — Assessment & Plan Note (Signed)
Continue Atorvastatin Encouraged him to consume a low fat diet 

## 2020-01-15 NOTE — Assessment & Plan Note (Signed)
Continue Valsartan HCT and Verapamil Encouraged DASH diet and exercise for weight loss Will monitor, if does not improve, will need to add additional agent

## 2020-01-15 NOTE — Patient Instructions (Signed)
Carbohydrate Counting for Diabetes Mellitus, Adult  Carbohydrate counting is a method of keeping track of how many carbohydrates you eat. Eating carbohydrates naturally increases the amount of sugar (glucose) in the blood. Counting how many carbohydrates you eat helps keep your blood glucose within normal limits, which helps you manage your diabetes (diabetes mellitus). It is important to know how many carbohydrates you can safely have in each meal. This is different for every person. A diet and nutrition specialist (registered dietitian) can help you make a meal plan and calculate how many carbohydrates you should have at each meal and snack. Carbohydrates are found in the following foods:  Grains, such as breads and cereals.  Dried beans and soy products.  Starchy vegetables, such as potatoes, peas, and corn.  Fruit and fruit juices.  Milk and yogurt.  Sweets and snack foods, such as cake, cookies, candy, chips, and soft drinks. How do I count carbohydrates? There are two ways to count carbohydrates in food. You can use either of the methods or a combination of both. Reading "Nutrition Facts" on packaged food The "Nutrition Facts" list is included on the labels of almost all packaged foods and beverages in the [place] It includes:  The serving size.  Information about nutrients in each serving, including the grams (g) of carbohydrate per serving. To use the "Nutrition Facts":  Decide how many servings you will have.  Multiply the number of servings by the number of carbohydrates per serving.  The resulting number is the total amount of carbohydrates that you will be having. Learning standard serving sizes of other foods When you eat carbohydrate foods that are not packaged or do not include "Nutrition Facts" on the label, you need to measure the servings in order to count the amount of carbohydrates:  Measure the foods that you will eat with a food scale or measuring cup, if  needed.  Decide how many standard-size servings you will eat.  Multiply the number of servings by 15. Most carbohydrate-rich foods have about 15 g of carbohydrates per serving. ? For example, if you eat 8 oz (170 g) of strawberries, you will have eaten 2 servings and 30 g of carbohydrates (2 servings x 15 g = 30 g).  For foods that have more than one food mixed, such as soups and casseroles, you must count the carbohydrates in each food that is included. The following list contains standard serving sizes of common carbohydrate-rich foods. Each of these servings has about 15 g of carbohydrates:   hamburger bun or  English muffin.   oz (15 mL) syrup.   oz (14 g) jelly.  1 slice of bread.  1 six-inch tortilla.  3 oz (85 g) cooked rice or pasta.  4 oz (113 g) cooked dried beans.  4 oz (113 g) starchy vegetable, such as peas, corn, or potatoes.  4 oz (113 g) hot cereal.  4 oz (113 g) mashed potatoes or  of a large baked potato.  4 oz (113 g) canned or frozen fruit.  4 oz (120 mL) fruit juice.  4-6 crackers.  6 chicken nuggets.  6 oz (170 g) unsweetened dry cereal.  6 oz (170 g) plain fat-free yogurt or yogurt sweetened with artificial sweeteners.  8 oz (240 mL) milk.  8 oz (170 g) fresh fruit or one small piece of fruit.  24 oz (680 g) popped popcorn. Example of carbohydrate counting Sample meal  3 oz (85 g) chicken breast.  6 oz (170 g)   brown rice.  4 oz (113 g) corn.  8 oz (240 mL) milk.  8 oz (170 g) strawberries with sugar-free whipped topping. Carbohydrate calculation 1. Identify the foods that contain carbohydrates: ? Rice. ? Corn. ? Milk. ? Strawberries. 2. Calculate how many servings you have of each food: ? 2 servings rice. ? 1 serving corn. ? 1 serving milk. ? 1 serving strawberries. 3. Multiply each number of servings by 15 g: ? 2 servings rice x 15 g = 30 g. ? 1 serving corn x 15 g = 15 g. ? 1 serving milk x 15 g = 15 g. ? 1  serving strawberries x 15 g = 15 g. 4. Add together all of the amounts to find the total grams of carbohydrates eaten: ? 30 g + 15 g + 15 g + 15 g = 75 g of carbohydrates total. Summary  Carbohydrate counting is a method of keeping track of how many carbohydrates you eat.  Eating carbohydrates naturally increases the amount of sugar (glucose) in the blood.  Counting how many carbohydrates you eat helps keep your blood glucose within normal limits, which helps you manage your diabetes.  A diet and nutrition specialist (registered dietitian) can help you make a meal plan and calculate how many carbohydrates you should have at each meal and snack. This information is not intended to replace advice given to you by your health care provider. Make sure you discuss any questions you have with your health care provider. Document Revised: 05/10/2017 Document Reviewed: 03/29/2016 Elsevier Patient Education  2020 Elsevier Inc.  

## 2020-01-15 NOTE — Progress Notes (Signed)
Subjective:    Patient ID: Randall Pace, male    DOB: [birth year], 64 y.o.   MRN: [id number]  HPI  Pt presents to the clinic today for 3 month follow up of HTN, HLD and DM 2.  HTN: His BP today is 144/84. He is taking Valsartan HCT and Verapamil as prescribed. ECG from 08/2018 reviewed.  HLD: His last LDL was 84, 09/2019. He denies myalgias on Atorvastatin. He does not consume a low fat diet.  DM 2: His last A1C was 11.8%, 09/2019. He is taking Metformin and Glipizide as prescribed. His sugars range 160-280. He does not check his feet routinely. His last eye exam was 1 year ago. Flu never. Pneumovax never.  Review of Systems      Past Medical History:  Diagnosis Date  . Arthritis    low back  . Diabetes mellitus   . GERD (gastroesophageal reflux disease)   . History of chicken pox   . Hypertension   . Migraine   . Patellofemoral pain syndrome   . Sleep apnea     Current Outpatient Medications  Medication Sig Dispense Refill  . aspirin 81 MG tablet Take 81 mg by mouth daily.     Marland Kitchen atorvastatin (LIPITOR) 40 MG tablet Take 1 tablet (40 mg total) by mouth daily. 90 tablet 3  . cetirizine (ZYRTEC ALLERGY) 10 MG tablet Take 1 tablet (10 mg total) by mouth daily. 30 tablet 6  . fenofibrate 54 MG tablet TAKE 1 TABLET DAILY 90 tablet 2  . glipiZIDE (GLUCOTROL) 10 MG tablet TAKE 1 TABLET TWICE A DAY BEFORE MEALS 180 tablet 1  . glucose blood (ONETOUCH ULTRA) test strip 1 each by Other route 2 (two) times daily. 200 each 1  . Lancets 30G MISC 1 each by Does not apply route 2 (two) times daily. 200 each 1  . metFORMIN (GLUCOPHAGE) 1000 MG tablet TAKE 1 TABLET TWICE A DAY WITH MEALS 180 tablet 0  . omeprazole (PRILOSEC) 20 MG capsule Take 1 capsule (20 mg total) by mouth daily. 90 capsule 2  . OVER THE COUNTER MEDICATION     . sildenafil (VIAGRA) 50 MG tablet TAKE 1 TABLET(50 MG) BY MOUTH DAILY AS NEEDED FOR ERECTILE DYSFUNCTION 10 tablet 0  . tiZANidine (ZANAFLEX) 4 MG capsule  Take 1 capsule (4 mg total) by mouth at bedtime as needed for muscle spasms. 20 capsule 0  . triamcinolone ointment (KENALOG) 0.5 % Apply 1 application topically 2 (two) times daily. 30 g 0  . valsartan-hydrochlorothiazide (DIOVAN-HCT) 320-25 MG tablet Take 1 tablet by mouth daily. 90 tablet 2  . verapamil (CALAN-SR) 180 MG CR tablet Take 1 tablet (180 mg total) by mouth at bedtime. MUST SCHEDULE PHYSICAL EXAM 90 tablet 0   No current facility-administered medications for this visit.    No Known Allergies  Family History  Problem Relation Age of Onset  . Cancer Father        prostate    Social History   Socioeconomic History  . Marital status: Married    Spouse name: Not on file  . Number of children: 2  . Years of education: Not on file  . Highest education level: Not on file  Occupational History  . Occupation: Psychologist, forensic, [employer]  Tobacco Use  . Smoking status: Never Smoker  . Smokeless tobacco: Never Used  Substance and Sexual Activity  . Alcohol use: Yes    Comment: occasional  . Drug use: No  . Sexual activity:  Not on file  Other Topics Concern  . Not on file  Social History Narrative  . Not on file   Social Determinants of Health   Financial Resource Strain:   . Difficulty of Paying Living Expenses:   Food Insecurity:   . Worried About Programme researcher, broadcasting/film/video in the Last Year:   . Barista in the Last Year:   Transportation Needs:   . Freight forwarder (Medical):   Marland Kitchen Lack of Transportation (Non-Medical):   Physical Activity:   . Days of Exercise per Week:   . Minutes of Exercise per Session:   Stress:   . Feeling of Stress :   Social Connections:   . Frequency of Communication with Friends and Family:   . Frequency of Social Gatherings with Friends and Family:   . Attends Religious Services:   . Active Member of Clubs or Organizations:   . Attends Banker Meetings:   Marland Kitchen Marital Status:   Intimate Partner  Violence:   . Fear of Current or Ex-Partner:   . Emotionally Abused:   Marland Kitchen Physically Abused:   . Sexually Abused:      Constitutional: Denies fever, malaise, fatigue, headache or abrupt weight changes.  HEENT: Denies eye pain, eye redness, ear pain, ringing in the ears, wax buildup, runny nose, nasal congestion, bloody nose, or sore throat. Respiratory: Denies difficulty breathing, shortness of breath, cough or sputum production.   Cardiovascular: Denies chest pain, chest tightness, palpitations or swelling in the hands or feet.  Neurological: Pt reports numbness in hands, chronic. Denies dizziness, difficulty with memory, difficulty with speech or problems with balance and coordination.    No other specific complaints in a complete review of systems (except as listed in HPI above).  Objective:   Physical Exam  BP (!) 144/84   Pulse 81   Temp 97.9 F (36.6 C) (Temporal)   Wt 214 lb (97.1 kg)   SpO2 98%   BMI 34.80 kg/m   Wt Readings from Last 3 Encounters:  09/30/19 204 lb (92.5 kg)  01/06/19 214 lb 1.9 oz (97.1 kg)  12/20/18 218 lb (98.9 kg)    General: Appears his stated age, obese in NAD. Skin: Warm, dry and intact. No  ulcerations noted. HEENT: Head: normal shape and size; Eyes: sclera white, no icterus, conjunctiva pink, PERRLA and EOMs intact;  Cardiovascular: Normal rate and rhythm. S1,S2 noted.  No murmur, rubs or gallops noted. No JVD or BLE edema. No carotid bruits noted. Pulmonary/Chest: Normal effort and positive vesicular breath sounds. No respiratory distress. No wheezes, rales or ronchi noted.  Musculoskeletal:  No difficulty with gait.  Neurological: Alert and oriented.   BMET    Component Value Date/Time   NA 136 09/30/2019 1109   K 4.6 09/30/2019 1109   CL 99 09/30/2019 1109   CO2 29 09/30/2019 1109   GLUCOSE 278 (H) 09/30/2019 1109   BUN 15 09/30/2019 1109   CREATININE 1.08 09/30/2019 1109   CALCIUM 10.2 09/30/2019 1109   GFRNONAA 68 12/14/2008  1023   GFRAA 82 12/14/2008 1023    Lipid Panel     Component Value Date/Time   CHOL 140 09/30/2019 1109   TRIG 101.0 09/30/2019 1109   HDL 35.90 (L) 09/30/2019 1109   CHOLHDL 4 09/30/2019 1109   VLDL 20.2 09/30/2019 1109   LDLCALC 84 09/30/2019 1109    CBC    Component Value Date/Time   WBC 5.4 09/30/2019 1109   RBC  4.70 09/30/2019 1109   HGB 14.1 09/30/2019 1109   HCT 42.1 09/30/2019 1109   PLT 254.0 09/30/2019 1109   MCV 89.5 09/30/2019 1109   MCHC 33.6 09/30/2019 1109   RDW 13.3 09/30/2019 1109   MONOABS 0.4 12/14/2008 1023   EOSABS 0.1 12/14/2008 1023   BASOSABS 0.0 12/14/2008 1023    Hgb A1C Lab Results  Component Value Date   HGBA1C 11.8 (H) 09/30/2019           Assessment & Plan:   Webb Silversmith, NP This visit occurred during the SARS-CoV-2 public health emergency.  Safety protocols were in place, including screening questions prior to the visit, additional usage of staff PPE, and extensive cleaning of exam room while observing appropriate contact time as indicated for disinfecting solutions.

## 2020-02-11 ENCOUNTER — Telehealth: Payer: Self-pay | Admitting: Internal Medicine

## 2020-02-11 NOTE — Telephone Encounter (Signed)
Pt called in to request we transfer all of his prescriptions to [facility]. He states the mail order just is not working for him. He said he is out of his blood pressure medication and they won't send him more.

## 2020-02-12 ENCOUNTER — Other Ambulatory Visit: Payer: Self-pay | Admitting: Internal Medicine

## 2020-02-12 MED ORDER — VERAPAMIL HCL ER 180 MG PO TBCR: 180.0000 mg | EXTENDED_RELEASE_TABLET | Freq: Every day | ORAL | 1 refills | Status: DC

## 2020-02-12 MED ORDER — ATORVASTATIN CALCIUM 40 MG PO TABS: 40.0000 mg | ORAL_TABLET | Freq: Every day | ORAL | 1 refills | Status: DC

## 2020-02-12 MED ORDER — METFORMIN HCL 1000 MG PO TABS: 1000.0000 mg | ORAL_TABLET | Freq: Two times a day (BID) | ORAL | 0 refills | Status: DC

## 2020-02-12 MED ORDER — VALSARTAN-HYDROCHLOROTHIAZIDE 320-25 MG PO TABS: 1.0000 | ORAL_TABLET | Freq: Every day | ORAL | 1 refills | Status: DC

## 2020-02-12 MED ORDER — SITAGLIPTIN PHOSPHATE 100 MG PO TABS: 100.0000 mg | ORAL_TABLET | Freq: Every day | ORAL | 1 refills | Status: DC

## 2020-02-12 MED ORDER — FENOFIBRATE 54 MG PO TABS: 54.0000 mg | ORAL_TABLET | Freq: Every day | ORAL | 2 refills | Status: DC

## 2020-02-12 MED ORDER — OMEPRAZOLE 20 MG PO CPDR: 20.0000 mg | DELAYED_RELEASE_CAPSULE | Freq: Every day | ORAL | 1 refills | Status: DC

## 2020-02-12 MED ORDER — GLIPIZIDE 10 MG PO TABS: ORAL_TABLET | ORAL | 1 refills | Status: DC

## 2020-02-12 NOTE — Telephone Encounter (Signed)
Rx sent through [facility]  

## 2020-04-09 ENCOUNTER — Other Ambulatory Visit: Payer: Self-pay | Admitting: Internal Medicine

## 2020-04-19 ENCOUNTER — Ambulatory Visit: Payer: BC Managed Care – PPO | Admitting: Internal Medicine

## 2020-04-20 ENCOUNTER — Other Ambulatory Visit: Payer: Self-pay | Admitting: Internal Medicine

## 2020-04-20 ENCOUNTER — Other Ambulatory Visit: Payer: Self-pay

## 2020-04-20 ENCOUNTER — Encounter: Payer: Self-pay | Admitting: Internal Medicine

## 2020-04-20 ENCOUNTER — Ambulatory Visit (INDEPENDENT_AMBULATORY_CARE_PROVIDER_SITE_OTHER)
Admission: RE | Admit: 2020-04-20 | Discharge: 2020-04-20 | Disposition: A | Discharge status: Home / Self Care | Payer: BC Managed Care – PPO | Source: Ambulatory Visit | Attending: Internal Medicine | Admitting: Internal Medicine

## 2020-04-20 ENCOUNTER — Ambulatory Visit (INDEPENDENT_AMBULATORY_CARE_PROVIDER_SITE_OTHER): Payer: BC Managed Care – PPO | Admitting: Internal Medicine

## 2020-04-20 VITALS — BP 150/84 | HR 85 | Temp 98.1°F | Wt 209.0 lb

## 2020-04-20 DIAGNOSIS — M1990 Unspecified osteoarthritis, unspecified site: Secondary | ICD-10-CM | POA: Diagnosis not present

## 2020-04-20 DIAGNOSIS — K219 Gastro-esophageal reflux disease without esophagitis: Secondary | ICD-10-CM

## 2020-04-20 DIAGNOSIS — M545 Low back pain, unspecified: Secondary | ICD-10-CM

## 2020-04-20 DIAGNOSIS — I10 Essential (primary) hypertension: Secondary | ICD-10-CM

## 2020-04-20 DIAGNOSIS — E1140 Type 2 diabetes mellitus with diabetic neuropathy, unspecified: Secondary | ICD-10-CM | POA: Diagnosis not present

## 2020-04-20 DIAGNOSIS — N521 Erectile dysfunction due to diseases classified elsewhere: Secondary | ICD-10-CM | POA: Diagnosis not present

## 2020-04-20 DIAGNOSIS — M25551 Pain in right hip: Secondary | ICD-10-CM

## 2020-04-20 DIAGNOSIS — G4733 Obstructive sleep apnea (adult) (pediatric): Secondary | ICD-10-CM

## 2020-04-20 DIAGNOSIS — E7800 Pure hypercholesterolemia, unspecified: Secondary | ICD-10-CM

## 2020-04-20 MED ORDER — SILDENAFIL CITRATE 100 MG PO TABS: 50.0000 mg | ORAL_TABLET | Freq: Every day | ORAL | 5 refills | Status: DC | PRN

## 2020-04-20 NOTE — Progress Notes (Addendum)
Subjective:    Patient ID: Randall Pace, male    DOB: 18-Feb-1956, 64 y.o.   MRN: [id number]  HPI  Pt presents to the clinic today for follow up of chronic conditions.  OA: He c/o worsening low back pain that is radiating into his right hip. He denies associated numbness and tingling in his right leg but does have some diabetic related neuropathic pain in his hands and feet. He is not taking any medication OTC for this.  DM 2 with Neuropathy. His last A1C was 9.9%, 12/2019. He is taking Glipizide but ran out of Metformin and Januvia. He does not check his sugars. He does not check his feet routinely. His last eye exam was less than a year ago. Flu never. Pneumovax never. Covid 12/2019, 12/2019.  ED: He has difficulty initiating erections. This is his biggest concerns. He is taking 2 Sildenafil for this with varying results.  HTN: His BP today is 150/84. He is taking Valsartan HCT and Verapamil, but he reports he often forgets to take it and did not take any of his medications this morning. ECG from 08/2018 reviewed.  HLD: His last LDL was 101, triglycerides 84, 09/2019. He takes Atorvastatin as prescribed. He ran out of Fenofibrate. He tries to consume a low fat diet.  GERD: He is not sure what triggers this. He takes Omeprazole daily with good relief. There is no upper GI on file.  OSA: He average less hours of sleep per night than he used to. He is not currently using a CPAP. His last sleep study was > 5 years ago. He would like referral to pulmonology for repeat sleep study.  Review of Systems        Past Medical History:  Diagnosis Date  . Arthritis    low back  . Diabetes mellitus   . GERD (gastroesophageal reflux disease)   . History of chicken pox   . Hypertension   . Migraine   . Patellofemoral pain syndrome   . Sleep apnea     Current Outpatient Medications  Medication Sig Dispense Refill  . aspirin 81 MG tablet Take 81 mg by mouth daily.     Marland Kitchen atorvastatin  (LIPITOR) 40 MG tablet Take 1 tablet (40 mg total) by mouth daily. 90 tablet 1  . Blood Glucose Monitoring Suppl (ONE TOUCH ULTRA 2) w/Device KIT See admin instructions.    . cetirizine (ZYRTEC ALLERGY) 10 MG tablet Take 1 tablet (10 mg total) by mouth daily. 30 tablet 6  . fenofibrate 54 MG tablet Take 1 tablet (54 mg total) by mouth daily. 90 tablet 2  . glipiZIDE (GLUCOTROL) 10 MG tablet TAKE 1 TABLET TWICE A DAY BEFORE MEALS 180 tablet 1  . glucose blood (ONETOUCH ULTRA) test strip 1 each by Other route 2 (two) times daily. 200 each 1  . Lancets 30G MISC 1 each by Does not apply route 2 (two) times daily. 200 each 1  . metFORMIN (GLUCOPHAGE) 1000 MG tablet Take 1 tablet (1,000 mg total) by mouth 2 (two) times daily with a meal. 180 tablet 0  . omeprazole (PRILOSEC) 20 MG capsule Take 1 capsule (20 mg total) by mouth daily. 90 capsule 1  . OVER THE COUNTER MEDICATION     . sildenafil (VIAGRA) 50 MG tablet TAKE 1 TABLET(50 MG) BY MOUTH DAILY AS NEEDED FOR ERECTILE DYSFUNCTION 10 tablet 0  . sitaGLIPtin (JANUVIA) 100 MG tablet Take 1 tablet (100 mg total) by mouth daily. 90 tablet 1  .  tiZANidine (ZANAFLEX) 4 MG capsule Take 1 capsule (4 mg total) by mouth at bedtime as needed for muscle spasms. 20 capsule 0  . triamcinolone ointment (KENALOG) 0.5 % Apply 1 application topically 2 (two) times daily. 30 g 0  . valsartan-hydrochlorothiazide (DIOVAN-HCT) 320-25 MG tablet Take 1 tablet by mouth daily. 90 tablet 1  . verapamil (CALAN-SR) 180 MG CR tablet Take 1 tablet (180 mg total) by mouth at bedtime. 90 tablet 1   No current facility-administered medications for this visit.    No Known Allergies  Family History  Problem Relation Age of Onset  . Cancer Father        prostate    Social History   Socioeconomic History  . Marital status: Married    Spouse name: Not on file  . Number of children: 2  . Years of education: Not on file  . Highest education level: Not on file  Occupational  History  . Occupation: Media planner, [employer]  Tobacco Use  . Smoking status: Never Smoker  . Smokeless tobacco: Never Used  Substance and Sexual Activity  . Alcohol use: Yes    Comment: occasional  . Drug use: No  . Sexual activity: Not on file  Other Topics Concern  . Not on file  Social History Narrative  . Not on file   Social Determinants of Health   Financial Resource Strain:   . Difficulty of Paying Living Expenses:   Food Insecurity:   . Worried About Charity fundraiser in the Last Year:   . Arboriculturist in the Last Year:   Transportation Needs:   . Film/video editor (Medical):   Marland Kitchen Lack of Transportation (Non-Medical):   Physical Activity:   . Days of Exercise per Week:   . Minutes of Exercise per Session:   Stress:   . Feeling of Stress :   Social Connections:   . Frequency of Communication with Friends and Family:   . Frequency of Social Gatherings with Friends and Family:   . Attends Religious Services:   . Active Member of Clubs or Organizations:   . Attends Archivist Meetings:   Marland Kitchen Marital Status:   Intimate Partner Violence:   . Fear of Current or Ex-Partner:   . Emotionally Abused:   Marland Kitchen Physically Abused:   . Sexually Abused:      Constitutional: Denies fever, malaise, fatigue, headache or abrupt weight changes.  HEENT: Denies eye pain, eye redness, ear pain, ringing in the ears, wax buildup, runny nose, nasal congestion, bloody nose, or sore throat. Respiratory: Denies difficulty breathing, shortness of breath, cough or sputum production.   Cardiovascular: Denies chest pain, chest tightness, palpitations or swelling in the hands or feet.  Gastrointestinal: Denies abdominal pain, bloating, constipation, diarrhea or blood in the stool.  GU: Pt reports erectile dysfunction. Denies urgency, frequency, pain with urination, burning sensation, blood in urine, odor or discharge. Musculoskeletal: Patient reports worsening  low back pain.  Denies decrease in range of motion, difficulty with gait, muscle pain or joint swelling.  Skin: Denies redness, rashes, lesions or ulcercations.  Neurological: Patient reports neuropathic pain in hands and feet.  Denies dizziness, difficulty with memory, difficulty with speech or problems with balance and coordination.  Psych: Denies anxiety, depression, SI/HI.  No other specific complaints in a complete review of systems (except as listed in HPI above).  Objective:   Physical Exam  BP (!) 150/84   Pulse 85   Temp  98.1 F (36.7 C) (Temporal)   Wt 209 lb (94.8 kg)   SpO2 98%   BMI 33.99 kg/m   Wt Readings from Last 3 Encounters:  01/15/20 214 lb (97.1 kg)  09/30/19 204 lb (92.5 kg)  01/06/19 214 lb 1.9 oz (97.1 kg)    General: Appears his stated age, obese, in NAD. Skin: Warm, dry and intact. No ulcerations noted. HEENT: Head: normal shape and size; Eyes: sclera white, no icterus, conjunctiva pink, PERRLA and EOMs intact;  Neck:  Neck supple, trachea midline. No masses, lumps or thyromegaly present.  Cardiovascular: Normal rate and rhythm. S1,S2 noted.  Murmur noted. No JVD or BLE edema. No carotid bruits noted. Pulmonary/Chest: Normal effort and positive vesicular breath sounds. No respiratory distress. No wheezes, rales or ronchi noted.  Abdomen: Soft and nontender. Normal bowel sounds. No distention or masses noted. Liver, spleen and kidneys non palpable. Musculoskeletal: Normal flexion, extension and rotation of the cervical spine.  Bony tenderness noted over the sacrum.  Strength 5/5 BUE/BLE.  No difficulty with gait.  Neurological: Alert and oriented. Cranial nerves II-XII grossly intact. Coordination normal.  Psychiatric: Mood and affect normal. Behavior is normal. Judgment and thought content normal.     BMET    Component Value Date/Time   NA 136 09/30/2019 1109   K 4.6 09/30/2019 1109   CL 99 09/30/2019 1109   CO2 29 09/30/2019 1109   GLUCOSE 278  (H) 09/30/2019 1109   BUN 15 09/30/2019 1109   CREATININE 1.08 09/30/2019 1109   CALCIUM 10.2 09/30/2019 1109   GFRNONAA 68 12/14/2008 1023   GFRAA 82 12/14/2008 1023    Lipid Panel     Component Value Date/Time   CHOL 140 09/30/2019 1109   TRIG 101.0 09/30/2019 1109   HDL 35.90 (L) 09/30/2019 1109   CHOLHDL 4 09/30/2019 1109   VLDL 20.2 09/30/2019 1109   LDLCALC 84 09/30/2019 1109    CBC    Component Value Date/Time   WBC 5.4 09/30/2019 1109   RBC 4.70 09/30/2019 1109   HGB 14.1 09/30/2019 1109   HCT 42.1 09/30/2019 1109   PLT 254.0 09/30/2019 1109   MCV 89.5 09/30/2019 1109   MCHC 33.6 09/30/2019 1109   RDW 13.3 09/30/2019 1109   MONOABS 0.4 12/14/2008 1023   EOSABS 0.1 12/14/2008 1023   BASOSABS 0.0 12/14/2008 1023    Hgb A1C Lab Results  Component Value Date   HGBA1C 9.9 (A) 01/15/2020           Assessment & Plan:    Webb Silversmith, NP This visit occurred during the SARS-CoV-2 public health emergency.  Safety protocols were in place, including screening questions prior to the visit, additional usage of staff PPE, and extensive cleaning of exam room while observing appropriate contact time as indicated for disinfecting solutions.

## 2020-04-21 ENCOUNTER — Other Ambulatory Visit (INDEPENDENT_AMBULATORY_CARE_PROVIDER_SITE_OTHER): Payer: BC Managed Care – PPO

## 2020-04-21 ENCOUNTER — Telehealth: Payer: Self-pay

## 2020-04-21 DIAGNOSIS — E1140 Type 2 diabetes mellitus with diabetic neuropathy, unspecified: Secondary | ICD-10-CM | POA: Diagnosis not present

## 2020-04-21 DIAGNOSIS — E7800 Pure hypercholesterolemia, unspecified: Secondary | ICD-10-CM

## 2020-04-21 LAB — COMPREHENSIVE METABOLIC PANEL
ALT: 28 U/L (ref 0–53)
AST: 18 U/L (ref 0–37)
Albumin: 4.3 g/dL (ref 3.5–5.2)
Alkaline Phosphatase: 78 U/L (ref 39–117)
BUN: 21 mg/dL (ref 6–23)
CO2: 30 mEq/L (ref 19–32)
Calcium: 10.4 mg/dL (ref 8.4–10.5)
Chloride: 99 mEq/L (ref 96–112)
Creatinine, Ser: 1.28 mg/dL (ref 0.40–1.50)
GFR: 68.62 mL/min (ref >60.00)
Glucose, Bld: 248 mg/dL — ABNORMAL HIGH (ref 70–99)
Potassium: 4.3 mEq/L (ref 3.5–5.1)
Sodium: 135 mEq/L (ref 135–145)
Total Bilirubin: 0.4 mg/dL (ref 0.2–1.2)
Total Protein: 6.7 g/dL (ref 6.0–8.3)

## 2020-04-21 LAB — LIPID PANEL
Cholesterol: 116 mg/dL (ref 0–200)
HDL: 27.1 mg/dL — ABNORMAL LOW (ref >39.00)
NonHDL: 89.32
Total CHOL/HDL Ratio: 4
Triglycerides: 277 mg/dL — ABNORMAL HIGH (ref 0.0–149.0)
VLDL: 55.4 mg/dL — ABNORMAL HIGH (ref 0.0–40.0)

## 2020-04-21 LAB — LDL CHOLESTEROL, DIRECT: Direct LDL: 44 mg/dL

## 2020-04-21 LAB — HEMOGLOBIN A1C: Hgb A1c MFr Bld: 10.9 % — ABNORMAL HIGH (ref 4.6–6.5)

## 2020-04-21 NOTE — Assessment & Plan Note (Signed)
C met and lipid profile today Encouraged him to consume a low fat diet and increase aerobic exercise Continue atorvastatin We will see if he needs fenofibrate based on labs

## 2020-04-21 NOTE — Telephone Encounter (Signed)
[facility] Night - Client Nonclinical Telephone Record AccessNurse Client [facility] Night - Client Client Site [facility] - Night Physician Nicki Reaper - NP Contact Type Call Who Is Calling Patient / Member / Family / Caregiver Caller Name Eian Vandervelden Caller Phone Number [phone] Patient Name Randall Pace Patient DOB 1956-04-18 Call Type Message Only Information Provided Reason for Call Request for General Office Information Initial Comment Caller states he was asked to come back to x ray and he is on his way and wanted to let them know. Additional Comment Provided office hours and advised to call back. Non symptomatic. Disp. Time Disposition Final User 04/21/2020 7:42:57 AM General Information Provided Yes Adron Bene, [place] Call Closed By: Adelfa Koh Transaction Date/Time: 04/21/2020 7:39:39 AM (ET)

## 2020-04-21 NOTE — Assessment & Plan Note (Signed)
Noncompliant No urine microalbumin secondary to ARB therapy A1c, C met and lipid profile today Discussed the importance of low-carb diet, exercise for weight loss Continue glipizide for now We will see if he needs Metformin and Januvia based on A1c Encourage routine eye exam Foot exam today He declines flu or pneumonia vaccines Covid UTD

## 2020-04-21 NOTE — Assessment & Plan Note (Signed)
Referral to pulmonology placed for repeat sleep study

## 2020-04-21 NOTE — Patient Instructions (Signed)
Fat and Cholesterol Restricted Eating Plan Getting too much fat and cholesterol in your diet may cause health problems. Choosing the right foods helps keep your fat and cholesterol at normal levels. This can keep you from getting certain diseases. Your doctor may recommend an eating plan that includes:  Total fat: ______% or less of total calories a day.  Saturated fat: ______% or less of total calories a day.  Cholesterol: less than _________mg a day.  Fiber: ______g a day. What are tips for following this plan? Meal planning  At meals, divide your plate into four equal parts: ? Fill one-half of your plate with vegetables and green salads. ? Fill one-fourth of your plate with whole grains. ? Fill one-fourth of your plate with low-fat (lean) protein foods.  Eat fish that is high in omega-3 fats at least two times a week. This includes mackerel, tuna, sardines, and salmon.  Eat foods that are high in fiber, such as whole grains, beans, apples, broccoli, carrots, peas, and barley. General tips   Work with your doctor to lose weight if you need to.  Avoid: ? Foods with added sugar. ? Fried foods. ? Foods with partially hydrogenated oils.  Limit alcohol intake to no more than 1 drink a day for nonpregnant women and 2 drinks a day for men. One drink equals 12 oz of beer, 5 oz of wine, or 1 oz of hard liquor. Reading food labels  Check food labels for: ? Trans fats. ? Partially hydrogenated oils. ? Saturated fat (g) in each serving. ? Cholesterol (mg) in each serving. ? Fiber (g) in each serving.  Choose foods with healthy fats, such as: ? Monounsaturated fats. ? Polyunsaturated fats. ? Omega-3 fats.  Choose grain products that have whole grains. Look for the word "whole" as the first word in the ingredient list. Cooking  Cook foods using low-fat methods. These include baking, boiling, grilling, and broiling.  Eat more home-cooked foods. Eat at restaurants and buffets  less often.  Avoid cooking using saturated fats, such as butter, cream, palm oil, palm kernel oil, and coconut oil. Recommended foods  Fruits  All fresh, canned (in natural juice), or frozen fruits. Vegetables  Fresh or frozen vegetables (raw, steamed, roasted, or grilled). Green salads. Grains  Whole grains, such as whole wheat or whole grain breads, crackers, cereals, and pasta. Unsweetened oatmeal, bulgur, barley, quinoa, or brown rice. Corn or whole wheat flour tortillas. Meats and other protein foods  Ground beef (85% or leaner), grass-fed beef, or beef trimmed of fat. Skinless chicken or turkey. Ground chicken or turkey. Pork trimmed of fat. All fish and seafood. Egg whites. Dried beans, peas, or lentils. Unsalted nuts or seeds. Unsalted canned beans. Nut butters without added sugar or oil. Dairy  Low-fat or nonfat dairy products, such as skim or 1% milk, 2% or reduced-fat cheeses, low-fat and fat-free ricotta or cottage cheese, or plain low-fat and nonfat yogurt. Fats and oils  Tub margarine without trans fats. Light or reduced-fat mayonnaise and salad dressings. Avocado. Olive, canola, sesame, or safflower oils. The items listed above may not be a complete list of foods and beverages you can eat. Contact a dietitian for more information. Foods to avoid Fruits  Canned fruit in heavy syrup. Fruit in cream or butter sauce. Fried fruit. Vegetables  Vegetables cooked in cheese, cream, or butter sauce. Fried vegetables. Grains  White bread. White pasta. White rice. Cornbread. Bagels, pastries, and croissants. Crackers and snack foods that contain trans fat   and hydrogenated oils. Meats and other protein foods  Fatty cuts of meat. Ribs, chicken wings, bacon, sausage, bologna, salami, chitterlings, fatback, hot dogs, bratwurst, and packaged lunch meats. Liver and organ meats. Whole eggs and egg yolks. Chicken and turkey with skin. Fried meat. Dairy  Whole or 2% milk, cream,  half-and-half, and cream cheese. Whole milk cheeses. Whole-fat or sweetened yogurt. Full-fat cheeses. Nondairy creamers and whipped toppings. Processed cheese, cheese spreads, and cheese curds. Beverages  Alcohol. Sugar-sweetened drinks such as sodas, lemonade, and fruit drinks. Fats and oils  Butter, stick margarine, lard, shortening, ghee, or bacon fat. Coconut, palm kernel, and palm oils. Sweets and desserts  Corn syrup, sugars, honey, and molasses. Candy. Jam and jelly. Syrup. Sweetened cereals. Cookies, pies, cakes, donuts, muffins, and ice cream. The items listed above may not be a complete list of foods and beverages you should avoid. Contact a dietitian for more information. Summary  Choosing the right foods helps keep your fat and cholesterol at normal levels. This can keep you from getting certain diseases.  At meals, fill one-half of your plate with vegetables and green salads.  Eat high-fiber foods, like whole grains, beans, apples, carrots, peas, and barley.  Limit added sugar, saturated fats, alcohol, and fried foods. This information is not intended to replace advice given to you by your health care provider. Make sure you discuss any questions you have with your health care provider. Document Revised: 06/19/2018 Document Reviewed: 07/03/2017 Elsevier Patient Education  2020 [employer]  

## 2020-04-21 NOTE — Assessment & Plan Note (Signed)
I advised him he would continue to have issues with the ED until he gets his blood pressure his blood sugar and his weight under control Sildenafil refilled today

## 2020-04-21 NOTE — Assessment & Plan Note (Signed)
Deteriorated X-ray lumbar spine and right hip ordered today Encouraged routine stretching, weight loss Avoid NSAIDs OTC due to elevated blood pressure

## 2020-04-21 NOTE — Assessment & Plan Note (Signed)
Noncompliant-discussed the importance of compliance with medication Continue valsartan HCT and verapamil Reinforced DASH diet and exercise for weight loss C met today

## 2020-04-21 NOTE — Assessment & Plan Note (Signed)
Discussed how weight loss could help improve reflux symptoms Continue daily omeprazole CBC and C met today

## 2020-04-22 NOTE — Addendum Note (Signed)
Addended by: Lorre Munroe on: 04/22/2020 12:06 PM   Modules accepted: Orders

## 2020-04-23 ENCOUNTER — Telehealth: Payer: Self-pay

## 2020-04-23 NOTE — Telephone Encounter (Signed)
Pt states he doesn't want to go to endocrinologist as he wouldn't follow through.  Pt states he's just going to find a cardiologist instead for some other "little things" like little chest twinges. When I asked if he needed our office to refer him to a cardiologist, pt stated "No" and would not elaborate any further.

## 2020-04-24 NOTE — Telephone Encounter (Signed)
If he does not want to see endocrinology, then he will need to find another provider due to his noncompliance with treatment regimen.

## 2020-05-07 ENCOUNTER — Other Ambulatory Visit: Payer: Self-pay | Admitting: Internal Medicine

## 2020-05-07 ENCOUNTER — Telehealth: Payer: Self-pay | Admitting: Internal Medicine

## 2020-05-07 DIAGNOSIS — M545 Low back pain, unspecified: Secondary | ICD-10-CM

## 2020-05-07 MED ORDER — FENOFIBRATE 54 MG PO TABS: 54.0000 mg | ORAL_TABLET | Freq: Every day | ORAL | 0 refills | Status: DC

## 2020-05-07 MED ORDER — GLIPIZIDE 10 MG PO TABS: ORAL_TABLET | ORAL | 0 refills | Status: DC

## 2020-05-07 MED ORDER — ATORVASTATIN CALCIUM 40 MG PO TABS: 40.0000 mg | ORAL_TABLET | Freq: Every day | ORAL | 0 refills | Status: DC

## 2020-05-07 MED ORDER — VERAPAMIL HCL ER 180 MG PO TBCR: 180.0000 mg | EXTENDED_RELEASE_TABLET | Freq: Every day | ORAL | 0 refills | Status: DC

## 2020-05-07 MED ORDER — SITAGLIPTIN PHOSPHATE 100 MG PO TABS: 100.0000 mg | ORAL_TABLET | Freq: Every day | ORAL | 0 refills | Status: DC

## 2020-05-07 MED ORDER — METFORMIN HCL 1000 MG PO TABS: 1000.0000 mg | ORAL_TABLET | Freq: Two times a day (BID) | ORAL | 0 refills | Status: DC

## 2020-05-07 MED ORDER — VALSARTAN-HYDROCHLOROTHIAZIDE 320-25 MG PO TABS: 1.0000 | ORAL_TABLET | Freq: Every day | ORAL | 0 refills | Status: DC

## 2020-05-07 NOTE — Telephone Encounter (Signed)
see result note, spoke to pt already

## 2020-05-07 NOTE — Telephone Encounter (Signed)
Patient called to find out the results of x-ray done at his last visit.  Patient said he's feeling worse.

## 2020-05-07 NOTE — Addendum Note (Signed)
Addended by: Roena Malady on: 05/07/2020 04:34 PM   Modules accepted: Orders

## 2020-05-09 ENCOUNTER — Other Ambulatory Visit: Payer: Self-pay | Admitting: Internal Medicine

## 2020-05-10 ENCOUNTER — Encounter: Payer: Self-pay | Admitting: Physical Medicine & Rehabilitation

## 2020-05-27 ENCOUNTER — Encounter
Payer: BC Managed Care – PPO | Attending: Physical Medicine & Rehabilitation | Admitting: Physical Medicine & Rehabilitation

## 2020-05-27 DIAGNOSIS — Z20822 Contact with and (suspected) exposure to covid-19: Secondary | ICD-10-CM | POA: Diagnosis not present

## 2020-06-15 ENCOUNTER — Ambulatory Visit (INDEPENDENT_AMBULATORY_CARE_PROVIDER_SITE_OTHER): Payer: BC Managed Care – PPO | Admitting: Internal Medicine

## 2020-06-15 ENCOUNTER — Encounter: Payer: Self-pay | Admitting: Internal Medicine

## 2020-06-15 ENCOUNTER — Other Ambulatory Visit: Payer: Self-pay

## 2020-06-15 VITALS — BP 148/86 | HR 92 | Temp 97.5°F | Wt 209.0 lb

## 2020-06-15 DIAGNOSIS — G8929 Other chronic pain: Secondary | ICD-10-CM

## 2020-06-15 DIAGNOSIS — M5441 Lumbago with sciatica, right side: Secondary | ICD-10-CM | POA: Diagnosis not present

## 2020-06-15 DIAGNOSIS — M25551 Pain in right hip: Secondary | ICD-10-CM

## 2020-06-15 MED ORDER — NAPROXEN 375 MG PO TABS: 375.0000 mg | ORAL_TABLET | Freq: Two times a day (BID) | ORAL | 0 refills | Status: DC

## 2020-06-15 MED ORDER — KETOROLAC TROMETHAMINE 30 MG/ML IJ SOLN
30.0000 mg | Freq: Once | INTRAMUSCULAR | Status: AC
  Administered 2020-06-15: 30 mg via INTRAMUSCULAR

## 2020-06-15 NOTE — Addendum Note (Signed)
Addended by: Roena Malady on: 06/15/2020 05:58 PM   Modules accepted: Orders

## 2020-06-15 NOTE — Patient Instructions (Signed)
Hip Exercises Ask your health care provider which exercises are safe for you. Do exercises exactly as told by your health care provider and adjust them as directed. It is normal to feel mild stretching, pulling, tightness, or discomfort as you do these exercises. Stop right away if you feel sudden pain or your pain gets worse. Do not begin these exercises until told by your health care provider. Stretching and range-of-motion exercises These exercises warm up your muscles and joints and improve the movement and flexibility of your hip. These exercises also help to relieve pain, numbness, and tingling. You may be asked to limit your range of motion if you had a hip replacement. Talk to your health care provider about these restrictions. Hamstrings, supine  1. Lie on your back (supine position). 2. Loop a belt or towel over the ball of your left / right foot. The ball of your foot is on the walking surface, right under your toes. 3. Straighten your left / right knee and slowly pull on the belt or towel to raise your leg until you feel a gentle stretch behind your knee (hamstring). ? Do not let your knee bend while you do this. ? Keep your other leg flat on the floor. 4. Hold this position for __________ seconds. 5. Slowly return your leg to the starting position. Repeat __________ times. Complete this exercise __________ times a day. Hip rotation  1. Lie on your back on a firm surface. 2. With your left / right hand, gently pull your left / right knee toward the shoulder that is on the same side of the body. Stop when your knee is pointing toward the ceiling. 3. Hold your left / right ankle with your other hand. 4. Keeping your knee steady, gently pull your left / right ankle toward your other shoulder until you feel a stretch in your buttocks. ? Keep your hips and shoulders firmly planted while you do this stretch. 5. Hold this position for __________ seconds. Repeat __________ times. Complete  this exercise __________ times a day. Seated stretch This exercise is sometimes called hamstrings and adductors stretch. 1. Sit on the floor with your legs stretched wide. Keep your knees straight during this exercise. 2. Keeping your head and back in a straight line, bend at your waist to reach for your left foot (position A). You should feel a stretch in your right inner thigh (adductors). 3. Hold this position for __________ seconds. Then slowly return to the upright position. 4. Keeping your head and back in a straight line, bend at your waist to reach forward (position B). You should feel a stretch behind both of your thighs and knees (hamstrings). 5. Hold this position for __________ seconds. Then slowly return to the upright position. 6. Keeping your head and back in a straight line, bend at your waist to reach for your right foot (position C). You should feel a stretch in your left inner thigh (adductors). 7. Hold this position for __________ seconds. Then slowly return to the upright position. Repeat __________ times. Complete this exercise __________ times a day. Lunge This exercise stretches the muscles of the hip (hip flexors). 1. Place your left / right knee on the floor and bend your other knee so that is directly over your ankle. You should be half-kneeling. 2. Keep good posture with your head over your shoulders. 3. Tighten your buttocks to point your tailbone downward. This will prevent your back from arching too much. 4. You should feel a   gentle stretch in the front of your left / right thigh and hip. If you do not feel a stretch, slide your other foot forward slightly and then slowly lunge forward with your chest up until your knee once again lines up over your ankle. ? Make sure your tailbone continues to point downward. 5. Hold this position for __________ seconds. 6. Slowly return to the starting position. Repeat __________ times. Complete this exercise __________ times a  day. Strengthening exercises These exercises build strength and endurance in your hip. Endurance is the ability to use your muscles for a long time, even after they get tired. Bridge This exercise strengthens the muscles of your hip (hip extensors). 1. Lie on your back on a firm surface with your knees bent and your feet flat on the floor. 2. Tighten your buttocks muscles and lift your bottom off the floor until the trunk of your body and your hips are level with your thighs. ? Do not arch your back. ? You should feel the muscles working in your buttocks and the back of your thighs. If you do not feel these muscles, slide your feet 1-2 inches (2.5-5 cm) farther away from your buttocks. 3. Hold this position for __________ seconds. 4. Slowly lower your hips to the starting position. 5. Let your muscles relax completely between repetitions. Repeat __________ times. Complete this exercise __________ times a day. Straight leg raises, side-lying This exercise strengthens the muscles that move the hip joint away from the center of the body (hip abductors). 1. Lie on your side with your left / right leg in the top position. Lie so your head, shoulder, hip, and knee line up. You may bend your bottom knee slightly to help you balance. 2. Roll your hips slightly forward, so your hips are stacked directly over each other and your left / right knee is facing forward. 3. Leading with your heel, lift your top leg 4-6 inches (10-15 cm). You should feel the muscles in your top hip lifting. ? Do not let your foot drift forward. ? Do not let your knee roll toward the ceiling. 4. Hold this position for __________ seconds. 5. Slowly return to the starting position. 6. Let your muscles relax completely between repetitions. Repeat __________ times. Complete this exercise __________ times a day. Straight leg raises, side-lying This exercise strengthens the muscles that move the hip joint toward the center of the  body (hip adductors). 1. Lie on your side with your left / right leg in the bottom position. Lie so your head, shoulder, hip, and knee line up. You may place your upper foot in front to help you balance. 2. Roll your hips slightly forward, so your hips are stacked directly over each other and your left / right knee is facing forward. 3. Tense the muscles in your inner thigh and lift your bottom leg 4-6 inches (10-15 cm). 4. Hold this position for __________ seconds. 5. Slowly return to the starting position. 6. Let your muscles relax completely between repetitions. Repeat __________ times. Complete this exercise __________ times a day. Straight leg raises, supine This exercise strengthens the muscles in the front of your thigh (quadriceps). 1. Lie on your back (supine position) with your left / right leg extended and your other knee bent. 2. Tense the muscles in the front of your left / right thigh. You should see your kneecap slide up or see increased dimpling just above your knee. 3. Keep these muscles tight as you raise your   leg 4-6 inches (10-15 cm) off the floor. Do not let your knee bend. 4. Hold this position for __________ seconds. 5. Keep these muscles tense as you lower your leg. 6. Relax the muscles slowly and completely between repetitions. Repeat __________ times. Complete this exercise __________ times a day. Hip abductors, standing This exercise strengthens the muscles that move the leg and hip joint away from the center of the body (hip abductors). 1. Tie one end of a rubber exercise band or tubing to a secure surface, such as a chair, table, or pole. 2. Loop the other end of the band or tubing around your left / right ankle. 3. Keeping your ankle with the band or tubing directly opposite the secured end, step away until there is tension in the tubing or band. Hold on to a chair, table, or pole as needed for balance. 4. Lift your left / right leg out to your side. While you do  this: ? Keep your back upright. ? Keep your shoulders over your hips. ? Keep your toes pointing forward. ? Make sure to use your hip muscles to slowly lift your leg. Do not tip your body or forcefully lift your leg. 5. Hold this position for __________ seconds. 6. Slowly return to the starting position. Repeat __________ times. Complete this exercise __________ times a day. Squats This exercise strengthens the muscles in the front of your thigh (quadriceps). 1. Stand in a door frame so your feet and knees are in line with the frame. You may place your hands on the frame for balance. 2. Slowly bend your knees and lower your hips like you are going to sit in a chair. ? Keep your lower legs in a straight-up-and-down position. ? Do not let your hips go lower than your knees. ? Do not bend your knees lower than told by your health care provider. ? If your hip pain increases, do not bend as low. 3. Hold this position for ___________ seconds. 4. Slowly push with your legs to return to standing. Do not use your hands to pull yourself to standing. Repeat __________ times. Complete this exercise __________ times a day. This information is not intended to replace advice given to you by your health care provider. Make sure you discuss any questions you have with your health care provider. Document Revised: 05/22/2019 Document Reviewed: 08/27/2018 Elsevier Patient Education  2020 [employer]  

## 2020-06-15 NOTE — Progress Notes (Signed)
Subjective:    Patient ID: Randall Pace, male    DOB: [birth year], 64 y.o.   MRN: [id number]  HPI  Pt presents to the clinic today with c/o right lower back pain. This started 2 weeks ago. He describes the pain as stiff. The pain radiates into his right hip and down his right leg. He reports associated leg weakness but denies worsening numbness, tingling (hx of diabetic neuropathy) or frequent falls. Xray of the lumbar spine from 03/2020 showed:  IMPRESSION: 1. Moderate multilevel lumbar degenerative disc disease. 2. Mild multilevel lumbar spondylolisthesis. 3. Mild bilateral lower lumbar facet arthropathy.  Xray of the right hip showed:  IMPRESSION: No acute osseous abnormality. No significant right hip arthropathy. Scattered tiny pelvic girdle enthesophytes.  He has tried CBD ointment, topical arthritis medication with minimal relief of symptoms.  Review of Systems      Past Medical History:  Diagnosis Date  . Arthritis    low back  . Diabetes mellitus   . GERD (gastroesophageal reflux disease)   . History of chicken pox   . Hypertension   . Migraine   . Patellofemoral pain syndrome   . Sleep apnea     Current Outpatient Medications  Medication Sig Dispense Refill  . aspirin 81 MG tablet Take 81 mg by mouth daily.     Marland Kitchen atorvastatin (LIPITOR) 40 MG tablet Take 1 tablet (40 mg total) by mouth daily. 90 tablet 0  . Blood Glucose Monitoring Suppl (ONE TOUCH ULTRA 2) w/Device KIT See admin instructions.    . cetirizine (ZYRTEC ALLERGY) 10 MG tablet Take 1 tablet (10 mg total) by mouth daily. 30 tablet 6  . fenofibrate 54 MG tablet Take 1 tablet (54 mg total) by mouth daily. 90 tablet 0  . glipiZIDE (GLUCOTROL) 10 MG tablet TAKE 1 TABLET TWICE A DAY BEFORE MEALS 180 tablet 0  . Lancets 30G MISC 1 each by Does not apply route 2 (two) times daily. 200 each 1  . metFORMIN (GLUCOPHAGE) 1000 MG tablet Take 1 tablet (1,000 mg total) by mouth 2 (two) times daily with a meal.  180 tablet 0  . omeprazole (PRILOSEC) 20 MG capsule Take 1 capsule (20 mg total) by mouth daily. 90 capsule 1  . ONETOUCH ULTRA test strip USE TO TEST TWICE DAILY 200 strip 1  . OVER THE COUNTER MEDICATION     . sildenafil (VIAGRA) 100 MG tablet Take 0.5-1 tablets (50-100 mg total) by mouth daily as needed for erectile dysfunction. 10 tablet 5  . sitaGLIPtin (JANUVIA) 100 MG tablet Take 1 tablet (100 mg total) by mouth daily. 90 tablet 0  . tiZANidine (ZANAFLEX) 4 MG capsule Take 1 capsule (4 mg total) by mouth at bedtime as needed for muscle spasms. 20 capsule 0  . triamcinolone ointment (KENALOG) 0.5 % Apply 1 application topically 2 (two) times daily. 30 g 0  . valsartan-hydrochlorothiazide (DIOVAN-HCT) 320-25 MG tablet Take 1 tablet by mouth daily. 90 tablet 0  . verapamil (CALAN-SR) 180 MG CR tablet Take 1 tablet (180 mg total) by mouth at bedtime. 90 tablet 0   No current facility-administered medications for this visit.    No Known Allergies  Family History  Problem Relation Age of Onset  . Cancer Father        prostate    Social History   Socioeconomic History  . Marital status: Married    Spouse name: Not on file  . Number of children: 2  . Years of education:  Not on file  . Highest education level: Not on file  Occupational History  . Occupation: Media planner, [employer]  Tobacco Use  . Smoking status: Never Smoker  . Smokeless tobacco: Never Used  Substance and Sexual Activity  . Alcohol use: Yes    Comment: occasional  . Drug use: No  . Sexual activity: Not on file  Other Topics Concern  . Not on file  Social History Narrative  . Not on file   Social Determinants of Health   Financial Resource Strain:   . Difficulty of Paying Living Expenses:   Food Insecurity:   . Worried About Charity fundraiser in the Last Year:   . Arboriculturist in the Last Year:   Transportation Needs:   . Film/video editor (Medical):   Marland Kitchen Lack of  Transportation (Non-Medical):   Physical Activity:   . Days of Exercise per Week:   . Minutes of Exercise per Session:   Stress:   . Feeling of Stress :   Social Connections:   . Frequency of Communication with Friends and Family:   . Frequency of Social Gatherings with Friends and Family:   . Attends Religious Services:   . Active Member of Clubs or Organizations:   . Attends Archivist Meetings:   Marland Kitchen Marital Status:   Intimate Partner Violence:   . Fear of Current or Ex-Partner:   . Emotionally Abused:   Marland Kitchen Physically Abused:   . Sexually Abused:      Constitutional: Denies fever, malaise, fatigue, headache or abrupt weight changes.  Respiratory: Denies difficulty breathing, shortness of breath, cough or sputum production.   Cardiovascular: Denies chest pain, chest tightness, palpitations or swelling in the hands or feet.  Gastrointestinal: Denies abdominal pain, bloating, constipation, diarrhea or blood in the stool.  GU: Denies urgency, frequency, pain with urination, burning sensation, blood in urine, odor or discharge. Musculoskeletal: Pt reports right low back pain, right hip pain and right leg pain. Denies muscle pain or joint swelling.  Skin: Denies redness, rashes, lesions or ulcercations.  Neurological: Pt reports chronic neuropathic pain, not worsening. Denies dizziness, difficulty with memory, difficulty with speech or problems with balance and coordination.    No other specific complaints in a complete review of systems (except as listed in HPI above).  Objective:   Physical Exam   BP (!) 148/86   Pulse 92   Temp (!) 97.5 F (36.4 C) (Temporal)   Wt 209 lb (94.8 kg)   SpO2 98%   BMI 33.99 kg/m   Wt Readings from Last 3 Encounters:  04/20/20 209 lb (94.8 kg)  01/15/20 214 lb (97.1 kg)  09/30/19 204 lb (92.5 kg)    General: Appears his stated age, obese, in NAD. Skin: Warm, dry and intact. No rashes noted. Cardiovascular: Normal rate and  rhythm.  Pulmonary/Chest: Normal effort and positive vesicular breath sounds.  Musculoskeletal: Pain with lateral bending to the right.  Normal flexion, extension and lateral bending to the left.  No bony tenderness noted over the lumbar spine.  Normal flexion, extension, abduction, abduction, internal and external rotation of the right hip.  Pain with palpation over the trochanter.  Strength 5/5 BLE. Neurological: Alert and oriented. Negative SLR on the right.    BMET    Component Value Date/Time   NA 135 04/21/2020 0755   K 4.3 04/21/2020 0755   CL 99 04/21/2020 0755   CO2 30 04/21/2020 0755   GLUCOSE 248 (  H) 04/21/2020 0755   BUN 21 04/21/2020 0755   CREATININE 1.28 04/21/2020 0755   CALCIUM 10.4 04/21/2020 0755   GFRNONAA 68 12/14/2008 1023   GFRAA 82 12/14/2008 1023    Lipid Panel     Component Value Date/Time   CHOL 116 04/21/2020 0755   TRIG 277.0 (H) 04/21/2020 0755   HDL 27.10 (L) 04/21/2020 0755   CHOLHDL 4 04/21/2020 0755   VLDL 55.4 (H) 04/21/2020 0755   LDLCALC 84 09/30/2019 1109    CBC    Component Value Date/Time   WBC 5.4 09/30/2019 1109   RBC 4.70 09/30/2019 1109   HGB 14.1 09/30/2019 1109   HCT 42.1 09/30/2019 1109   PLT 254.0 09/30/2019 1109   MCV 89.5 09/30/2019 1109   MCHC 33.6 09/30/2019 1109   RDW 13.3 09/30/2019 1109   MONOABS 0.4 12/14/2008 1023   EOSABS 0.1 12/14/2008 1023   BASOSABS 0.0 12/14/2008 1023    Hgb A1C Lab Results  Component Value Date   HGBA1C 10.9 (H) 04/21/2020          Assessment & Plan:  Chronic Right-Sided Low Back Pain with Right-Sided Sciatica, Chronic Right Hip Pain:  X-rays reviewed with patient 30 mg Toradol IM today Rx for Naproxen 375 mg twice daily x1 week, consume with food He has been referred to PT, he will call to get this set up Referral to orthopedics placed for further evaluation  Return precautions discussed  Webb Silversmith, NP This visit occurred during the SARS-CoV-2 public health  emergency.  Safety protocols were in place, including screening questions prior to the visit, additional usage of staff PPE, and extensive cleaning of exam room while observing appropriate contact time as indicated for disinfecting solutions.

## 2020-06-17 DIAGNOSIS — E113293 Type 2 diabetes mellitus with mild nonproliferative diabetic retinopathy without macular edema, bilateral: Secondary | ICD-10-CM | POA: Diagnosis not present

## 2020-06-17 DIAGNOSIS — H524 Presbyopia: Secondary | ICD-10-CM | POA: Diagnosis not present

## 2020-06-17 DIAGNOSIS — H5213 Myopia, bilateral: Secondary | ICD-10-CM | POA: Diagnosis not present

## 2020-06-17 DIAGNOSIS — H43823 Vitreomacular adhesion, bilateral: Secondary | ICD-10-CM | POA: Diagnosis not present

## 2020-06-17 DIAGNOSIS — H52223 Regular astigmatism, bilateral: Secondary | ICD-10-CM | POA: Diagnosis not present

## 2020-06-18 ENCOUNTER — Encounter
Payer: BC Managed Care – PPO | Attending: Physical Medicine & Rehabilitation | Admitting: Physical Medicine & Rehabilitation

## 2020-06-18 ENCOUNTER — Other Ambulatory Visit: Payer: Self-pay

## 2020-06-18 ENCOUNTER — Encounter: Payer: Self-pay | Admitting: Physical Medicine & Rehabilitation

## 2020-06-18 VITALS — BP 161/108 | HR 87 | Temp 98.0°F | Resp 96 | Ht 65.7 in | Wt 209.2 lb

## 2020-06-18 DIAGNOSIS — E0842 Diabetes mellitus due to underlying condition with diabetic polyneuropathy: Secondary | ICD-10-CM | POA: Diagnosis not present

## 2020-06-18 DIAGNOSIS — M7061 Trochanteric bursitis, right hip: Secondary | ICD-10-CM

## 2020-06-18 DIAGNOSIS — M4716 Other spondylosis with myelopathy, lumbar region: Secondary | ICD-10-CM | POA: Insufficient documentation

## 2020-06-18 DIAGNOSIS — M5136 Other intervertebral disc degeneration, lumbar region: Secondary | ICD-10-CM | POA: Diagnosis not present

## 2020-06-18 DIAGNOSIS — E1140 Type 2 diabetes mellitus with diabetic neuropathy, unspecified: Secondary | ICD-10-CM | POA: Insufficient documentation

## 2020-06-18 DIAGNOSIS — E1142 Type 2 diabetes mellitus with diabetic polyneuropathy: Secondary | ICD-10-CM | POA: Insufficient documentation

## 2020-06-18 NOTE — Progress Notes (Signed)
Subjective:    Patient ID: Randall Pace, male    DOB: [birth year], 64 y.o.   MRN: [id number]  HPI  CC:  Low back   Low back started about ~1 yr ago worsening over the last month.  Morning stiffness, doing furniture walking  Some pain radiation to RIght calf but not feet.  Has history of diabetic neuropathy.  Not taking meds  No exercise on a regular basis Pain affects job, does Tax inspector to door for [employer]  Standing exacerbates pain, driving exacerbates pain, walking up inclines    Weight steady for the last 2 yrs per pt   Pain Inventory Average Pain 9 Pain Right Now 7 My pain is constant, sharp and vibrating type pain  In the last 24 hours, has pain interfered with the following? General activity 7 Relation with others 5 Enjoyment of life 6 What TIME of day is your pain at its worst? morning  Sleep (in general) Poor  Pain is worse with: walking and sitting Pain improves with: no relief may be CBD cream Relief from Meds: 0  how many minutes can you walk? Unknown ability to climb steps?  yes do you drive?  yes Do you have any goals in this area?  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  yes  bowel control problems weakness tremor trouble walking  New patient 03/2020  New patient  LUMBAR SPINE - COMPLETE 4+ VIEW   COMPARISON:  None.   FINDINGS: This report assumes 5 non rib-bearing lumbar vertebrae.   Lumbar vertebral body heights are preserved, with no fracture.   Moderate multilevel lumbar degenerative disc disease, most prominent at L2-3 and L5-S1. There is mild 3 mm retrolisthesis at L1-2 and mild 3 mm anterolisthesis at L4-5. Mild bilateral lower lumbar facet arthropathy. No aggressive appearing focal osseous lesions.   IMPRESSION: 1. Moderate multilevel lumbar degenerative disc disease. 2. Mild multilevel lumbar  spondylolisthesis. 3. Mild bilateral lower lumbar facet arthropathy.   Family History  Problem Relation Age of Onset   Cancer Father        prostate   Social History   Socioeconomic History   Marital status: Married    Spouse name: Not on file   Number of children: 2   Years of education: Not on file   Highest education level: Not on file  Occupational History   Occupation: Psychologist, forensic, [employer]  Tobacco Use   Smoking status: Never Smoker   Smokeless tobacco: Never Used  Building services engineer Use: Never used  Substance and Sexual Activity   Alcohol use: Yes    Comment: occasional   Drug use: No   Sexual activity: Yes  Other Topics Concern   Not on file  Social History Narrative   Not on file   Social Determinants of Health   Financial Resource Strain:    Difficulty of Paying Living Expenses: Not on file  Food Insecurity:    Worried About Programme researcher, broadcasting/film/video in the Last Year: Not on file   [employer] of Food in the Last Year: Not on file  Transportation Needs:    Lack of Transportation (Medical): Not on file   Lack of Transportation (Non-Medical): Not on file  Physical Activity:    Days of Exercise per Week: Not on file   Minutes of Exercise per Session:  Not on file  Stress:    Feeling of Stress : Not on file  Social Connections:    Frequency of Communication with Friends and Family: Not on file   Frequency of Social Gatherings with Friends and Family: Not on file   Attends Religious Services: Not on file   Active Member of Clubs or Organizations: Not on file   Attends Banker Meetings: Not on file   Marital Status: Not on file   Past Surgical History:  Procedure Laterality Date   NASAL SINUS SURGERY     palatoplasts     TONSILLECTOMY     uvoloplasty     Past Medical History:  Diagnosis Date   Arthritis    low back   Diabetes mellitus    GERD (gastroesophageal reflux disease)    History  of chicken pox    Hypertension    Migraine    Patellofemoral pain syndrome    Sleep apnea    BP (!) 166/113    Pulse 87    Temp 98 F (36.7 C)    Resp (!) 96    Ht 5' 5.7" (1.669 m)    Wt 209 lb 3.2 oz (94.9 kg)    BMI 34.07 kg/m   Opioid Risk Score:   Fall Risk Score:  `1  Depression screen PHQ 2/9  Depression screen [facility] 2/9 09/30/2019 09/06/2018 02/25/2018  Decreased Interest 0 0 0  Down, Depressed, Hopeless 0 0 0  PHQ - 2 Score 0 0 0   Review of Systems     Objective:   Physical Exam Constitutional:      General: He is not in acute distress.    Appearance: He is obese.  HENT:     Head: Normocephalic and atraumatic.  Eyes:     Extraocular Movements: Extraocular movements intact.     Conjunctiva/sclera: Conjunctivae normal.     Pupils: Pupils are equal, round, and reactive to light.  Neck:     Comments: Range of motion 75% of normal for flexion extension lateral bending patient Cardiovascular:     Rate and Rhythm: Normal rate and regular rhythm.     Pulses: Normal pulses.     Heart sounds: Normal heart sounds. No murmur heard.   Pulmonary:     Effort: Pulmonary effort is normal. No respiratory distress.     Breath sounds: Normal breath sounds. No stridor. No wheezing.  Abdominal:     General: Abdomen is flat. Bowel sounds are normal. There is no distension.     Palpations: Abdomen is soft. There is no mass.     Tenderness: There is no abdominal tenderness.  Musculoskeletal:        General: Tenderness present.     Cervical back: No tenderness.     Right lower leg: No edema.     Left lower leg: No edema.     Comments: Lumbar range of motion reduced, 50% flexion extension lateral bending and rotation.  Extension is more painful than flexion. Negative Faber's bilaterally negative pelvic compression test Negative straight leg raising test  Neurological:     Mental Status: He is alert and oriented to person, place, and time.     Motor: No tremor or abnormal  muscle tone.     Coordination: Coordination is intact.     Gait: Gait is intact.     Comments: Motor strength is 5/5 bilateral deltoid bicep tricep grip hip flexor knee extensor ankle dorsiflexion Sensation reduced right L3-L4-L5 dermatomal distribution  Assessment & Plan:  #1.  Acute exacerbation of chronic low back pain.  The patient has some reduced sensation L3-L4-L5 dermatomal distribution on the right side however no significant pain down the right lower extremity.  At this time I do not think an MRI is needed unless these lower extremity symptoms progress In terms of the etiology of his pain is likely multifactorial, a combination of lumbar degenerative disc as well as lumbar spondylosis.  He does have some disc height loss at L5-S1 noted on the x-ray as well as multilevel facet degenerative changes. We also discussed the impact of his weight on his spine biomechanics specifically increasing lumbar lordosis which would exacerbate his lumbar spondylosis mediated pain. Patient also has decreased range of motion Would recommend outpatient physical therapy We also discussed using heating pad as well as a topical cream for pain relief particularly every morning when he is having a lot of stiffness 2.  Trochanteric bursitis right side difficulty sleeping on that side have given him some exercises for this PT can address it as well We discussed oral medication however the patient would like to avoid this because of taking many medications. Addressed elevated blood pressure he states that he took his blood pressure medications shortly before coming to the office we will recheck it at home

## 2020-06-18 NOTE — Patient Instructions (Signed)
Hip Bursitis Rehab Ask your health care provider which exercises are safe for you. Do exercises exactly as told by your health care provider and adjust them as directed. It is normal to feel mild stretching, pulling, tightness, or discomfort as you do these exercises. Stop right away if you feel sudden pain or your pain gets worse. Do not begin these exercises until told by your health care provider. Stretching exercise This exercise warms up your muscles and joints and improves the movement and flexibility of your hip. This exercise also helps to relieve pain and stiffness. Iliotibial band stretch An iliotibial band is a strong band of muscle tissue that runs from the outer side of your hip to the outer side of your thigh and knee. 1. Lie on your side with your left / right leg in the top position. 2. Bend your left / right knee and grab your ankle. Stretch out your bottom arm to help you balance. 3. Slowly bring your knee back so your thigh is behind your body. 4. Slowly lower your knee toward the floor until you feel a gentle stretch on the outside of your left / right thigh. If you do not feel a stretch and your knee will not fall farther, place the heel of your other foot on top of your knee and pull your knee down toward the floor with your foot. 5. Hold this position for __________ seconds. 6. Slowly return to the starting position. Repeat __________ times. Complete this exercise __________ times a day. Strengthening exercises These exercises build strength and endurance in your hip and pelvis. Endurance is the ability to use your muscles for a long time, even after they get tired. Bridge This exercise strengthens the muscles that move your thigh backward (hip extensors). 1. Lie on your back on a firm surface with your knees bent and your feet flat on the floor. 2. Tighten your buttocks muscles and lift your buttocks off the floor until your trunk is level with your thighs. ? Do not arch  your back. ? You should feel the muscles working in your buttocks and the back of your thighs. If you do not feel these muscles, slide your feet 1-2 inches (2.5-5 cm) farther away from your buttocks. ? If this exercise is too easy, try doing it with your arms crossed over your chest. 3. Hold this position for __________ seconds. 4. Slowly lower your hips to the starting position. 5. Let your muscles relax completely after each repetition. Repeat __________ times. Complete this exercise __________ times a day. Squats This exercise strengthens the muscles in front of your thigh and knee (quadriceps). 1. Stand in front of a table, with your feet and knees pointing straight ahead. You may rest your hands on the table for balance but not for support. 2. Slowly bend your knees and lower your hips like you are going to sit in a chair. ? Keep your weight over your heels, not over your toes. ? Keep your lower legs upright so they are parallel with the table legs. ? Do not let your hips go lower than your knees. ? Do not bend lower than told by your health care provider. ? If your hip pain increases, do not bend as low. 3. Hold the squat position for __________ seconds. 4. Slowly push with your legs to return to standing. Do not use your hands to pull yourself to standing. Repeat __________ times. Complete this exercise __________ times a day. Hip hike 1. Stand   sideways on a bottom step. Stand on your left / right leg with your other foot unsupported next to the step. You can hold on to the railing or wall for balance if needed. 2. Keep your knees straight and your torso square. Then lift your left / right hip up toward the ceiling. 3. Hold this position for __________ seconds. 4. Slowly let your left / right hip lower toward the floor, past the starting position. Your foot should get closer to the floor. Do not lean or bend your knees. Repeat __________ times. Complete this exercise __________ times a  day. Single leg stand 1. Without shoes, stand near a railing or in a doorway. You may hold on to the railing or door frame as needed for balance. 2. Squeeze your left / right buttock muscles, then lift up your other foot. ? Do not let your left / right hip push out to the side. ? It is helpful to stand in front of a mirror for this exercise so you can watch your hip. 3. Hold this position for __________ seconds. Repeat __________ times. Complete this exercise __________ times a day. This information is not intended to replace advice given to you by your health care provider. Make sure you discuss any questions you have with your health care provider. Document Revised: 02/10/2019 Document Reviewed: 02/10/2019 Elsevier Patient Education  2020 Elsevier Inc.  

## 2020-06-21 ENCOUNTER — Telehealth: Payer: Self-pay

## 2020-06-21 NOTE — Telephone Encounter (Signed)
Pt wanted me to send a FYI to [place] that he woke up with swelling in your left knee and numbness in his knee and left leg. Offered to try to get him in tomorrow but he declined. Wanted to let Rene Kocher know so it would be noted in his chart.

## 2020-06-21 NOTE — Telephone Encounter (Signed)
noted 

## 2020-06-22 ENCOUNTER — Ambulatory Visit (INDEPENDENT_AMBULATORY_CARE_PROVIDER_SITE_OTHER): Payer: BC Managed Care – PPO | Admitting: Surgical

## 2020-06-22 ENCOUNTER — Other Ambulatory Visit: Payer: Self-pay | Admitting: Internal Medicine

## 2020-06-22 DIAGNOSIS — M5441 Lumbago with sciatica, right side: Secondary | ICD-10-CM

## 2020-06-23 NOTE — Telephone Encounter (Signed)
Last filled 06/15/2020... please advise  

## 2020-06-27 ENCOUNTER — Encounter: Payer: Self-pay | Admitting: Surgical

## 2020-06-27 NOTE — Progress Notes (Signed)
Office Visit Note   Patient: Randall Pace           Date of Birth: 06/13/1956           MRN: [id number] Visit Date: 06/22/2020 Requested by: Lorre Munroe, NP [address] [place],  [place] [address] PCP: Lorre Munroe, NP  Subjective: Chief Complaint  Patient presents with  . Back Pain  . Hip Pain    HPI: Randall Pace is a 64 y.o. male who presents to the office complaining of radicular leg pain.  Patient has history of 3 months of pain without injury.  He describes radicular pain that extends from his buttock down into posterior calf that occurs almost every day.  He has associated numbness and tingling.  This primarily affects his right leg.  Pain does not wake him up at night but it does keep him from falling asleep.  He notes constant buttock pain.  He notes his "legs feel weak".  He has no history of MRI.  He has no history of lumbar spine or hip surgery.  He has been taking naproxen from his primary care physician with little relief.  He had radiographs of the lumbar spine that were ordered by his PCP that revealed moderate multilevel lumbar degenerative disc disease, mild lumbar spondylolisthesis, mild lumbar facet arthropathy.  He also had radiographs of the right hip ordered that revealed no acute abnormality..                ROS: All systems reviewed are negative as they relate to the chief complaint within the history of present illness.  Patient denies fevers or chills.  Assessment & Plan: Visit Diagnoses:  1. Bilateral low back pain with right-sided sciatica, unspecified chronicity     Plan: Patient is a 64 year old male who presents complaining of back pain and radicular pain down his right leg primarily.  He has lumbar spine x-ray that revealed moderate degenerative disc disease throughout the lumbar spine.  With 3 months of symptoms and no improvement, plan to order MRI of the lumbar spine for further evaluation.  He does have some mild weakness of the hip  flexor on exam.  He has subjective numbness and tingling but no objective loss of sensation on exam today.  Pain is keeping him from sleeping and making daily life miserable.  Follow-up after MRI to review results.  Patient agreed with plan.  Follow-Up Instructions: No follow-ups on file.   Orders:  Orders Placed This Encounter  Procedures  . MR Lumbar Spine w/o contrast   No orders of the defined types were placed in this encounter.     Procedures: No procedures performed   Clinical Data: No additional findings.  Objective: Vital Signs: There were no vitals taken for this visit.  Physical Exam:  Constitutional: Patient appears well-developed HEENT:  Head: Normocephalic Eyes:EOM are normal Neck: Normal range of motion Cardiovascular: Normal rate Pulmonary/chest: Effort normal Neurologic: Patient is alert Skin: Skin is warm Psychiatric: Patient has normal mood and affect  Ortho Exam: Ortho exam demonstrates right leg with positive straight leg raise.  Negative straight leg raise of the left leg.  Sensation intact through all dermatomes of the bilateral lower extremities.  No pain with internal rotation/external rotation of the bilateral hip joints.  Negative Stinchfield exam bilaterally.  5/5 motor strength of the bilateral quadriceps, hamstring, dorsiflexion, and plantar flexion.  Mild weakness of the right hip flexor compared with the left hip flexor.  No  evidence of clonus.  Specialty Comments:  No specialty comments available.  Imaging: No results found.   PMFS History: Patient Active Problem List   Diagnosis Date Noted  . Lumbar spondylosis with myelopathy 06/18/2020  . Diabetic polyneuropathy associated with diabetes mellitus due to underlying condition (HCC) 06/18/2020  . Trochanteric bursitis of right hip 06/18/2020  . Lumbar degenerative disc disease 06/18/2020  . Arthritis 02/25/2018  . HLD (hyperlipidemia) 02/25/2018  . OSA (obstructive sleep apnea)  02/25/2018  . Erectile dysfunction 02/25/2018  . Diabetic neuropathy, type II diabetes mellitus (HCC) 01/23/2011  . Essential hypertension 12/14/2008  . GERD 12/14/2008   Past Medical History:  Diagnosis Date  . Arthritis    low back  . Diabetes mellitus   . GERD (gastroesophageal reflux disease)   . History of chicken pox   . Hypertension   . Migraine   . Patellofemoral pain syndrome   . Sleep apnea     Family History  Problem Relation Age of Onset  . Cancer Father        prostate    Past Surgical History:  Procedure Laterality Date  . NASAL SINUS SURGERY    . palatoplasts    . TONSILLECTOMY    . uvoloplasty     Social History   Occupational History  . Occupation: Psychologist, forensic, [employer]  Tobacco Use  . Smoking status: Never Smoker  . Smokeless tobacco: Never Used  Vaping Use  . Vaping Use: Never used  Substance and Sexual Activity  . Alcohol use: Yes    Comment: occasional  . Drug use: No  . Sexual activity: Yes

## 2020-07-02 ENCOUNTER — Ambulatory Visit: Payer: BC Managed Care – PPO | Admitting: Physical Medicine & Rehabilitation

## 2020-07-08 ENCOUNTER — Ambulatory Visit (INDEPENDENT_AMBULATORY_CARE_PROVIDER_SITE_OTHER): Payer: BC Managed Care – PPO

## 2020-07-08 ENCOUNTER — Encounter: Payer: Self-pay | Admitting: Podiatry

## 2020-07-08 ENCOUNTER — Ambulatory Visit (INDEPENDENT_AMBULATORY_CARE_PROVIDER_SITE_OTHER): Payer: BC Managed Care – PPO | Admitting: Podiatry

## 2020-07-08 ENCOUNTER — Other Ambulatory Visit: Payer: Self-pay

## 2020-07-08 DIAGNOSIS — E1142 Type 2 diabetes mellitus with diabetic polyneuropathy: Secondary | ICD-10-CM

## 2020-07-08 MED ORDER — GABAPENTIN 300 MG PO CAPS: 300.0000 mg | ORAL_CAPSULE | Freq: Three times a day (TID) | ORAL | 3 refills | Status: DC

## 2020-07-08 NOTE — Progress Notes (Signed)
Subjective:  Patient ID: Randall Pace, male    DOB: [birth year],  MRN: [id number] HPI Chief Complaint  Patient presents with  . Diabetes    Patient states he has numbness, burning, tingling x several months, notices more at night, sometimes has dorsal midfoot pain bilateral - last a1c was 10.9, does not take meds for neuropathy  . New Patient (Initial Visit)    64 y.o. male presents with the above complaint.   Discussed etiology pathology conservative versus surgical therapies.  Past Medical History:  Diagnosis Date  . Arthritis    low back  . Diabetes mellitus   . GERD (gastroesophageal reflux disease)   . History of chicken pox   . Hypertension   . Migraine   . Patellofemoral pain syndrome   . Sleep apnea    Past Surgical History:  Procedure Laterality Date  . NASAL SINUS SURGERY    . palatoplasts    . TONSILLECTOMY    . uvoloplasty      Current Outpatient Medications:  .  aspirin 81 MG tablet, Take 81 mg by mouth daily. , Disp: , Rfl:  .  atorvastatin (LIPITOR) 40 MG tablet, Take 1 tablet (40 mg total) by mouth daily., Disp: 90 tablet, Rfl: 0 .  Blood Glucose Monitoring Suppl (ONE TOUCH ULTRA 2) w/Device KIT, See admin instructions., Disp: , Rfl:  .  cetirizine (ZYRTEC ALLERGY) 10 MG tablet, Take 1 tablet (10 mg total) by mouth daily., Disp: 30 tablet, Rfl: 6 .  fenofibrate 54 MG tablet, Take 1 tablet (54 mg total) by mouth daily., Disp: 90 tablet, Rfl: 0 .  gabapentin (NEURONTIN) 300 MG capsule, Take 1 capsule (300 mg total) by mouth 3 (three) times daily., Disp: 90 capsule, Rfl: 3 .  glipiZIDE (GLUCOTROL) 10 MG tablet, TAKE 1 TABLET TWICE A DAY BEFORE MEALS, Disp: 180 tablet, Rfl: 0 .  Lancets 30G MISC, 1 each by Does not apply route 2 (two) times daily., Disp: 200 each, Rfl: 1 .  metFORMIN (GLUCOPHAGE) 1000 MG tablet, Take 1 tablet (1,000 mg total) by mouth 2 (two) times daily with a meal., Disp: 180 tablet, Rfl: 0 .  naproxen (NAPROSYN) 375 MG tablet, Take 1  tablet (375 mg total) by mouth 2 (two) times daily with a meal., Disp: 14 tablet, Rfl: 0 .  omeprazole (PRILOSEC) 20 MG capsule, Take 1 capsule (20 mg total) by mouth daily., Disp: 90 capsule, Rfl: 1 .  ONETOUCH ULTRA test strip, USE TO TEST TWICE DAILY, Disp: 200 strip, Rfl: 1 .  OVER THE COUNTER MEDICATION, , Disp: , Rfl:  .  sildenafil (VIAGRA) 100 MG tablet, Take 0.5-1 tablets (50-100 mg total) by mouth daily as needed for erectile dysfunction., Disp: 10 tablet, Rfl: 5 .  sitaGLIPtin (JANUVIA) 100 MG tablet, Take 1 tablet (100 mg total) by mouth daily., Disp: 90 tablet, Rfl: 0 .  tiZANidine (ZANAFLEX) 4 MG capsule, Take 1 capsule (4 mg total) by mouth at bedtime as needed for muscle spasms., Disp: 20 capsule, Rfl: 0 .  triamcinolone ointment (KENALOG) 0.5 %, Apply 1 application topically 2 (two) times daily., Disp: 30 g, Rfl: 0 .  valsartan-hydrochlorothiazide (DIOVAN-HCT) 320-25 MG tablet, Take 1 tablet by mouth daily., Disp: 90 tablet, Rfl: 0 .  verapamil (CALAN-SR) 180 MG CR tablet, Take 1 tablet (180 mg total) by mouth at bedtime., Disp: 90 tablet, Rfl: 0  No Known Allergies Review of Systems Objective:  There were no vitals filed for this visit.  General: Well developed, nourished,  in no acute distress, alert and oriented x3   Dermatological: Skin is warm, dry and supple bilateral. Nails x 10 are well maintained; remaining integument appears unremarkable at this time. There are no open sores, no preulcerative lesions, no rash or signs of infection present.  Vascular: Dorsalis Pedis artery and Posterior Tibial artery pedal pulses are 2/4 bilateral with immedate capillary fill time. Pedal hair growth present. No varicosities and no lower extremity edema present bilateral.   Neruologic: Grossly intact via light touch bilateral. Vibratory intact via tuning fork bilateral. Protective threshold with Semmes Wienstein monofilament diminished to all pedal sites bilateral. Patellar and Achilles  deep tendon reflexes 2+ bilateral. No Babinski or clonus noted bilateral.   Musculoskeletal: No gross boney pedal deformities bilateral. No pain, crepitus, or limitation noted with foot and ankle range of motion bilateral. Muscular strength 5/5 in all groups tested bilateral.  Gait: Unassisted, Nonantalgic.    Radiographs:  Radiographs taken today demonstrate an osseously mature individual with mild pes planus.  Mild midfoot osteoarthritic changes.  Assessment & Plan:   Assessment: Diabetic peripheral neuropathy.   Plan: Start him on gabapentin 300 mg 1 p.o. 3 times daily #90 with 3 refills follow-up with him in 1 month he will call with questions or concerns.     Kendalynn Wideman T. [place], [place]

## 2020-07-13 ENCOUNTER — Ambulatory Visit: Payer: BC Managed Care – PPO | Admitting: Endocrinology

## 2020-07-26 ENCOUNTER — Encounter: Payer: Self-pay | Admitting: Endocrinology

## 2020-07-30 ENCOUNTER — Encounter: Payer: BC Managed Care – PPO | Admitting: Physical Medicine & Rehabilitation

## 2020-07-30 ENCOUNTER — Encounter: Payer: Self-pay | Admitting: Internal Medicine

## 2020-08-09 ENCOUNTER — Other Ambulatory Visit: Payer: Self-pay | Admitting: Internal Medicine

## 2020-08-11 ENCOUNTER — Ambulatory Visit: Payer: BC Managed Care – PPO | Admitting: Podiatry

## 2020-11-10 ENCOUNTER — Other Ambulatory Visit: Payer: Self-pay | Admitting: Internal Medicine

## 2021-02-08 ENCOUNTER — Other Ambulatory Visit: Payer: Self-pay | Admitting: Internal Medicine

## 2021-03-17 ENCOUNTER — Telehealth: Payer: Self-pay

## 2021-03-17 NOTE — Telephone Encounter (Signed)
Staying with [facility]

## 2021-03-23 ENCOUNTER — Encounter: Payer: Self-pay | Admitting: Internal Medicine

## 2021-03-23 ENCOUNTER — Other Ambulatory Visit: Payer: Self-pay

## 2021-03-23 ENCOUNTER — Ambulatory Visit (INDEPENDENT_AMBULATORY_CARE_PROVIDER_SITE_OTHER): Payer: BC Managed Care – PPO | Admitting: Internal Medicine

## 2021-03-23 VITALS — BP 136/84 | HR 82 | Temp 97.4°F | Ht 66.0 in | Wt 195.0 lb

## 2021-03-23 DIAGNOSIS — E1140 Type 2 diabetes mellitus with diabetic neuropathy, unspecified: Secondary | ICD-10-CM | POA: Diagnosis not present

## 2021-03-23 DIAGNOSIS — E1142 Type 2 diabetes mellitus with diabetic polyneuropathy: Secondary | ICD-10-CM

## 2021-03-23 DIAGNOSIS — I350 Nonrheumatic aortic (valve) stenosis: Secondary | ICD-10-CM | POA: Insufficient documentation

## 2021-03-23 DIAGNOSIS — I358 Other nonrheumatic aortic valve disorders: Secondary | ICD-10-CM

## 2021-03-23 DIAGNOSIS — E785 Hyperlipidemia, unspecified: Secondary | ICD-10-CM

## 2021-03-23 DIAGNOSIS — R011 Cardiac murmur, unspecified: Secondary | ICD-10-CM | POA: Insufficient documentation

## 2021-03-23 DIAGNOSIS — I10 Essential (primary) hypertension: Secondary | ICD-10-CM | POA: Diagnosis not present

## 2021-03-23 DIAGNOSIS — Z125 Encounter for screening for malignant neoplasm of prostate: Secondary | ICD-10-CM

## 2021-03-23 DIAGNOSIS — R1031 Right lower quadrant pain: Secondary | ICD-10-CM

## 2021-03-23 LAB — RENAL FUNCTION PANEL
Albumin: 4.3 g/dL (ref 3.5–5.2)
BUN: 18 mg/dL (ref 6–23)
CO2: 27 mEq/L (ref 19–32)
Calcium: 10 mg/dL (ref 8.4–10.5)
Chloride: 106 mEq/L (ref 96–112)
Creatinine, Ser: 1.12 mg/dL (ref 0.40–1.50)
GFR: 69.57 mL/min (ref >60.00)
Glucose, Bld: 174 mg/dL — ABNORMAL HIGH (ref 70–99)
Phosphorus: 2.8 mg/dL (ref 2.3–4.6)
Potassium: 4.2 mEq/L (ref 3.5–5.1)
Sodium: 141 mEq/L (ref 135–145)

## 2021-03-23 LAB — HEPATIC FUNCTION PANEL
ALT: 18 U/L (ref 0–53)
AST: 15 U/L (ref 0–37)
Albumin: 4.3 g/dL (ref 3.5–5.2)
Alkaline Phosphatase: 60 U/L (ref 39–117)
Bilirubin, Direct: 0.1 mg/dL (ref 0.0–0.3)
Total Bilirubin: 0.3 mg/dL (ref 0.2–1.2)
Total Protein: 6.8 g/dL (ref 6.0–8.3)

## 2021-03-23 LAB — POCT GLYCOSYLATED HEMOGLOBIN (HGB A1C): Hemoglobin A1C: 6.7 % — AB (ref 4.0–5.6)

## 2021-03-23 LAB — LIPID PANEL
Cholesterol: 104 mg/dL (ref 0–200)
HDL: 31.7 mg/dL — ABNORMAL LOW (ref >39.00)
LDL Cholesterol: 50 mg/dL (ref 0–99)
NonHDL: 71.88
Total CHOL/HDL Ratio: 3
Triglycerides: 107 mg/dL (ref 0.0–149.0)
VLDL: 21.4 mg/dL (ref 0.0–40.0)

## 2021-03-23 LAB — PSA: PSA: 1.39 ng/mL (ref 0.10–4.00)

## 2021-03-23 LAB — CBC
HCT: 36.2 % — ABNORMAL LOW (ref 39.0–52.0)
Hemoglobin: 12.1 g/dL — ABNORMAL LOW (ref 13.0–17.0)
MCHC: 33.4 g/dL (ref 30.0–36.0)
MCV: 87.8 fl (ref 78.0–100.0)
Platelets: 272 10*3/uL (ref 150.0–400.0)
RBC: 4.13 Mil/uL — ABNORMAL LOW (ref 4.22–5.81)
RDW: 13.5 % (ref 11.5–15.5)
WBC: 6.8 10*3/uL (ref 4.0–10.5)

## 2021-03-23 LAB — HM DIABETES FOOT EXAM

## 2021-03-23 MED ORDER — METFORMIN HCL 1000 MG PO TABS: 1000.0000 mg | ORAL_TABLET | Freq: Two times a day (BID) | ORAL | 3 refills | Status: DC

## 2021-03-23 NOTE — Assessment & Plan Note (Signed)
BP Readings from Last 3 Encounters:  03/23/21 136/84  06/18/20 (!) 161/108  06/15/20 (!) 148/86   Control is okay on verapamil and valsartan/HCTZ

## 2021-03-23 NOTE — Assessment & Plan Note (Signed)
Ongoing Will set up with urologist

## 2021-03-23 NOTE — Assessment & Plan Note (Signed)
Long standing Will consider echo next time

## 2021-03-23 NOTE — Assessment & Plan Note (Signed)
On atorvastatin---not sure he needs the fenofibrate

## 2021-03-23 NOTE — Assessment & Plan Note (Signed)
Has done better with lifestyle No januvia due to cost Lab Results  Component Value Date   HGBA1C 6.7 (A) 03/23/2021   Good control now on glipizide and metformin

## 2021-03-23 NOTE — Progress Notes (Signed)
Subjective:    Patient ID: Randall Pace, male    DOB: June 02, 1956, 64 y.o.   MRN: [id number]  HPI Here to establish care and review diabetes and other chronic health conditions This visit occurred during the SARS-CoV-2 public health emergency.  Safety protocols were in place, including screening questions prior to the visit, additional usage of staff PPE, and extensive cleaning of exam room while observing appropriate contact time as indicated for disinfecting solutions.   Trying to eat better---avoiding bread Has lost 14# Not checking sugars  Still working--needs the insurance at this point  No chest pain No SOB Doesn't exercise No palpitations--remembers something about a murmur but not the atrial fib No dizziness or syncope    Current Outpatient Medications on File Prior to Visit  Medication Sig Dispense Refill  . aspirin 81 MG tablet Take 81 mg by mouth daily.    Marland Kitchen atorvastatin (LIPITOR) 40 MG tablet TAKE 1 TABLET(40 MG) BY MOUTH DAILY 90 tablet 0  . Blood Glucose Monitoring Suppl (ONE TOUCH ULTRA 2) w/Device KIT See admin instructions.    . cetirizine (ZYRTEC ALLERGY) 10 MG tablet Take 1 tablet (10 mg total) by mouth daily. 30 tablet 6  . fenofibrate 54 MG tablet TAKE 1 TABLET(54 MG) BY MOUTH DAILY 90 tablet 0  . glipiZIDE (GLUCOTROL) 10 MG tablet TAKE 1 TABLET BY MOUTH TWICE DAILY BEFORE MEALS 180 tablet 0  . Lancets 30G MISC 1 each by Does not apply route 2 (two) times daily. 200 each 1  . metFORMIN (GLUCOPHAGE) 1000 MG tablet Take 1 tablet (1,000 mg total) by mouth 2 (two) times daily with a meal. 180 tablet 0  . omeprazole (PRILOSEC) 20 MG capsule TAKE 1 CAPSULE(20 MG) BY MOUTH DAILY 90 capsule 1  . ONETOUCH ULTRA test strip USE TO TEST TWICE DAILY 200 strip 1  . OVER THE COUNTER MEDICATION     . sildenafil (VIAGRA) 100 MG tablet Take 0.5-1 tablets (50-100 mg total) by mouth daily as needed for erectile dysfunction. 10 tablet 5  . tiZANidine (ZANAFLEX) 4 MG capsule  Take 1 capsule (4 mg total) by mouth at bedtime as needed for muscle spasms. 20 capsule 0  . triamcinolone ointment (KENALOG) 0.5 % Apply 1 application topically 2 (two) times daily. 30 g 0  . valsartan-hydrochlorothiazide (DIOVAN-HCT) 320-25 MG tablet TAKE 1 TABLET BY MOUTH DAILY 90 tablet 0  . verapamil (CALAN-SR) 180 MG CR tablet TAKE 1 TABLET(180 MG) BY MOUTH AT BEDTIME 90 tablet 0   No current facility-administered medications on file prior to visit.    No Known Allergies  Past Medical History:  Diagnosis Date  . Arthritis    low back  . Diabetes mellitus   . GERD (gastroesophageal reflux disease)   . History of chicken pox   . Hypertension   . Migraine   . Patellofemoral pain syndrome   . Sleep apnea     Past Surgical History:  Procedure Laterality Date  . NASAL SINUS SURGERY    . palatoplasts    . TONSILLECTOMY    . uvoloplasty      Family History  Problem Relation Age of Onset  . Cancer Father        prostate    Social History   Socioeconomic History  . Marital status: Married    Spouse name: Not on file  . Number of children: 2  . Years of education: Not on file  . Highest education level: Not on file  Occupational History  .  Occupation: Scientist, clinical (histocompatibility and immunogenetics): [employer]  Tobacco Use  . Smoking status: Never Smoker  . Smokeless tobacco: Never Used  Vaping Use  . Vaping Use: Never used  Substance and Sexual Activity  . Alcohol use: Yes    Comment: occasional  . Drug use: No  . Sexual activity: Yes  Other Topics Concern  . Not on file  Social History Narrative  . Not on file   Social Determinants of Health   Financial Resource Strain: Not on file  Food Insecurity: Not on file  Transportation Needs: Not on file  Physical Activity: Not on file  Stress: Not on file  Social Connections: Not on file  Intimate Partner Violence: Not on file   Review of Systems Has umbilical hernia Had pain after sex last time--from inner perineum/right testicle and  groin (not afraid to try again)    Objective:   Physical Exam Constitutional:      Appearance: Normal appearance.  Cardiovascular:     Rate and Rhythm: Normal rate and regular rhythm.     Pulses: Normal pulses.     Heart sounds: No gallop.      Comments: Soft aortic systolic murmur Pulmonary:     Effort: Pulmonary effort is normal.     Breath sounds: Normal breath sounds. No wheezing or rales.  Abdominal:     Palpations: Abdomen is soft.     Tenderness: There is no abdominal tenderness.  Genitourinary:    Comments: Testes normal Mild epididymal tenderness on the right No hernia Musculoskeletal:     Right lower leg: No edema.     Left lower leg: No edema.  Skin:    Comments: No foot lesions  Neurological:     Mental Status: He is alert.     Comments: Decreased sensation in feet  Psychiatric:        Mood and Affect: Mood normal.        Behavior: Behavior normal.            Assessment & Plan:

## 2021-03-24 ENCOUNTER — Other Ambulatory Visit: Payer: Self-pay | Admitting: Internal Medicine

## 2021-03-24 DIAGNOSIS — D649 Anemia, unspecified: Secondary | ICD-10-CM

## 2021-03-31 ENCOUNTER — Other Ambulatory Visit: Payer: Self-pay

## 2021-03-31 ENCOUNTER — Other Ambulatory Visit (INDEPENDENT_AMBULATORY_CARE_PROVIDER_SITE_OTHER): Payer: BC Managed Care – PPO

## 2021-03-31 DIAGNOSIS — D649 Anemia, unspecified: Secondary | ICD-10-CM

## 2021-03-31 LAB — CBC WITH DIFFERENTIAL/PLATELET
Basophils Absolute: 0 10*3/uL (ref 0.0–0.1)
Basophils Relative: 0.4 % (ref 0.0–3.0)
Eosinophils Absolute: 0.1 10*3/uL (ref 0.0–0.7)
Eosinophils Relative: 1.8 % (ref 0.0–5.0)
HCT: 37.2 % — ABNORMAL LOW (ref 39.0–52.0)
Hemoglobin: 12.5 g/dL — ABNORMAL LOW (ref 13.0–17.0)
Lymphocytes Relative: 21.1 % (ref 12.0–46.0)
Lymphs Abs: 1.4 10*3/uL (ref 0.7–4.0)
MCHC: 33.6 g/dL (ref 30.0–36.0)
MCV: 86.6 fl (ref 78.0–100.0)
Monocytes Absolute: 0.4 10*3/uL (ref 0.1–1.0)
Monocytes Relative: 6.3 % (ref 3.0–12.0)
Neutro Abs: 4.6 10*3/uL (ref 1.4–7.7)
Neutrophils Relative %: 70.4 % (ref 43.0–77.0)
Platelets: 261 10*3/uL (ref 150.0–400.0)
RBC: 4.29 Mil/uL (ref 4.22–5.81)
RDW: 13.4 % (ref 11.5–15.5)
WBC: 6.6 10*3/uL (ref 4.0–10.5)

## 2021-03-31 LAB — FOLATE: Folate: 15.1 ng/mL (ref >5.9)

## 2021-03-31 LAB — VITAMIN B12: Vitamin B-12: 355 pg/mL (ref 211–911)

## 2021-04-01 LAB — IBC + FERRITIN
Ferritin: 65.5 ng/mL (ref 22.0–322.0)
Iron: 23 ug/dL — ABNORMAL LOW (ref 42–165)
Saturation Ratios: 5.1 % — ABNORMAL LOW (ref 20.0–50.0)
Transferrin: 322 mg/dL (ref 212.0–360.0)

## 2021-04-04 ENCOUNTER — Other Ambulatory Visit: Payer: Self-pay

## 2021-04-04 DIAGNOSIS — D649 Anemia, unspecified: Secondary | ICD-10-CM

## 2021-04-04 NOTE — Progress Notes (Signed)
04/05/2021 8:59 AM   Randall Pace 1956-05-12 [id number]  Referring provider: Jearld Fenton, NP [place],  [place] [address]  Chief Complaint  Patient presents with  . Groin Pain    HPI: 65 year old male referred for further evaluation of right groin pain.  This been going on for years, at least 3.  He reports that it is exacerbated with sexual activity as well as moving from a seated to standing position.  The pain can be sharp in the inguinal region radiating down the testicle and cord.  He denies any associated abdominal pain nausea or vomiting.  Seems to be worsening over the last several years.  Its nagging overall.  He denies any urinary symptoms.  He had a series of hip x-rays back in 03/2020 to assess whether or not he may have degenerative hip disease which was fairly unremarkable.  He does note today that he has an umbilical hernia which is occasionally symptomatic as well as diastases of his rectus muscle.   PMH: Past Medical History:  Diagnosis Date  . Arthritis    low back  . Diabetes mellitus   . GERD (gastroesophageal reflux disease)   . History of chicken pox   . Hypertension   . Migraine   . Patellofemoral pain syndrome   . Sleep apnea     Surgical History: Past Surgical History:  Procedure Laterality Date  . NASAL SINUS SURGERY    . palatoplasts    . TONSILLECTOMY    . uvoloplasty      Home Medications:  Allergies as of 04/05/2021   No Known Allergies     Medication List       Accurate as of April 05, 2021  8:59 AM. If you have any questions, ask your nurse or doctor.        STOP taking these medications   tiZANidine 4 MG capsule Commonly known as: Zanaflex Stopped by: Hollice Espy, MD     TAKE these medications   aspirin 81 MG tablet Take 81 mg by mouth daily.   atorvastatin 40 MG tablet Commonly known as: LIPITOR TAKE 1 TABLET(40 MG) BY MOUTH DAILY   cetirizine 10 MG tablet Commonly known as: ZyrTEC Allergy Take  1 tablet (10 mg total) by mouth daily.   fenofibrate 54 MG tablet TAKE 1 TABLET(54 MG) BY MOUTH DAILY   glipiZIDE 10 MG tablet Commonly known as: GLUCOTROL TAKE 1 TABLET BY MOUTH TWICE DAILY BEFORE MEALS   Lancets 30G Misc 1 each by Does not apply route 2 (two) times daily.   metFORMIN 1000 MG tablet Commonly known as: GLUCOPHAGE Take 1 tablet (1,000 mg total) by mouth 2 (two) times daily with a meal.   omeprazole 20 MG capsule Commonly known as: PRILOSEC TAKE 1 CAPSULE(20 MG) BY MOUTH DAILY   ONE TOUCH ULTRA 2 w/Device Kit See admin instructions.   OneTouch Ultra test strip Generic drug: glucose blood USE TO TEST TWICE DAILY   OVER THE COUNTER MEDICATION   sildenafil 100 MG tablet Commonly known as: Viagra Take 0.5-1 tablets (50-100 mg total) by mouth daily as needed for erectile dysfunction.   triamcinolone ointment 0.5 % Commonly known as: KENALOG Apply 1 application topically 2 (two) times daily.   valsartan-hydrochlorothiazide 320-25 MG tablet Commonly known as: DIOVAN-HCT TAKE 1 TABLET BY MOUTH DAILY   verapamil 180 MG CR tablet Commonly known as: CALAN-SR TAKE 1 TABLET(180 MG) BY MOUTH AT BEDTIME       Allergies: No  Known Allergies  Family History: Family History  Problem Relation Age of Onset  . Cancer Father        prostate    Social History:  reports that he has never smoked. He has never used smokeless tobacco. He reports current alcohol use. He reports that he does not use drugs.   Physical Exam: BP (!) 175/82   Pulse 92   Ht $R'5\' 6"'Wc$  (1.676 m)   Wt 192 lb (87.1 kg)   BMI 30.99 kg/m   Constitutional:  Alert and oriented, No acute distress. HEENT: [place] AT, moist mucus membranes.  Trachea midline, no masses. Cardiovascular: No clubbing, cyanosis, or edema. Respiratory: Normal respiratory effort, no increased work of breathing. GI: Abdomen is obese with 4 cm periumbilical hernia defect appreciated. GU: Circumcised phallus which is laterally  displaced to the left.  There are some fullness of his right hemiscrotum especially along the cord which is mildly tender.  Normal bilateral testicles.  Left cord is normal.  There is some slight bulge in the inguinal canal on the right as compared to the left; ? right inguinal hernia Skin: No rashes, bruises or suspicious lesions. Neurologic: Grossly intact, no focal deficits, moving all 4 extremities. Psychiatric: Normal mood and affect.  Laboratory Data: Lab Results  Component Value Date   WBC 6.6 03/31/2021   HGB 12.5 (L) 03/31/2021   HCT 37.2 (L) 03/31/2021   MCV 86.6 03/31/2021   PLT 261.0 03/31/2021    Lab Results  Component Value Date   CREATININE 1.12 03/23/2021    Lab Results  Component Value Date   PSA 1.39 03/23/2021   PSA 1.62 09/30/2019   PSA 1.17 09/05/2018   Lab Results  Component Value Date   HGBA1C 6.7 (A) 03/23/2021    Urinalysis UA today is negative  Pertinent Imaging: No recent abdominal/pelvic cross-sectional imaging for review today  Assessment & Plan:    1. Right groin pain Based on the fullness of his right inguinal area and along the cord, highly suspicious for right inguinal hernia   He also has a small umbilical hernia which suggest overall weakness of fascia  Recommend CT pelvis to diagnose definitively as the exam today is somewhat limited by habitus.  Will have him return to follow-up CT scan.  If there is no hernia, will explore other possible etiologies including pelvic floor dysfunction/chronic testicular pain syndrome, etc.  - Urinalysis, Complete - CT Pelvis Wo Contrast; Future    Hollice Espy, MD  [facility] [address], [place], [place] [address] [phone]

## 2021-04-05 ENCOUNTER — Encounter: Payer: Self-pay | Admitting: Urology

## 2021-04-05 ENCOUNTER — Other Ambulatory Visit: Payer: Self-pay

## 2021-04-05 ENCOUNTER — Ambulatory Visit (INDEPENDENT_AMBULATORY_CARE_PROVIDER_SITE_OTHER): Payer: BC Managed Care – PPO | Admitting: Urology

## 2021-04-05 VITALS — BP 175/82 | HR 92 | Ht 66.0 in | Wt 192.0 lb

## 2021-04-05 DIAGNOSIS — R1031 Right lower quadrant pain: Secondary | ICD-10-CM | POA: Diagnosis not present

## 2021-04-05 LAB — URINALYSIS, COMPLETE
Bilirubin, UA: NEGATIVE
Glucose, UA: NEGATIVE
Ketones, UA: NEGATIVE
Leukocytes,UA: NEGATIVE
Nitrite, UA: NEGATIVE
RBC, UA: NEGATIVE
Specific Gravity, UA: 1.025 (ref 1.005–1.030)
Urobilinogen, Ur: 0.2 mg/dL (ref 0.2–1.0)
pH, UA: 5.5 (ref 5.0–7.5)

## 2021-04-05 LAB — MICROSCOPIC EXAMINATION: Bacteria, UA: NONE SEEN

## 2021-04-12 ENCOUNTER — Telehealth: Payer: Self-pay | Admitting: Internal Medicine

## 2021-04-12 NOTE — Telephone Encounter (Signed)
Left message on VM repeating message from Dr Alphonsus Sias about his labs and the need for the referral to GI. Advised him to call if he had any questions.

## 2021-04-12 NOTE — Telephone Encounter (Signed)
Eual Fines, CMA  04/04/2021 11:56 AM EDT Back to Top     Spoke to pt. He would like to go to [facility]. I have done thereferral.

## 2021-04-12 NOTE — Telephone Encounter (Signed)
Spoke to pt. He was confused. He saw Dr Benna Dunks (urology) and was asking about his anemia. He could not understand why she did not know anything about the anemia. I explained she works for urology. He is waiting on a referral from GI to address the anemia. Advised hm if he did not hear from them by next week, to call our office and ask to speak to the referral coordinators.

## 2021-04-12 NOTE — Telephone Encounter (Signed)
Pt came in office wanted to know the results of of his lab work he took on 6 /2. Pt does not know how to work my chart and need assistance. I gave pt the IT number to call. Pt requested to be call with results [phone]. Please advise

## 2021-04-12 NOTE — Telephone Encounter (Signed)
Returning phone call °

## 2021-04-22 ENCOUNTER — Ambulatory Visit
Admission: RE | Admit: 2021-04-22 | Discharge: 2021-04-22 | Disposition: A | Discharge status: Home / Self Care | Payer: BC Managed Care – PPO | Source: Ambulatory Visit | Attending: Urology | Admitting: Urology

## 2021-04-22 ENCOUNTER — Other Ambulatory Visit: Payer: Self-pay

## 2021-04-22 DIAGNOSIS — I700 Atherosclerosis of aorta: Secondary | ICD-10-CM | POA: Diagnosis not present

## 2021-04-22 DIAGNOSIS — K429 Umbilical hernia without obstruction or gangrene: Secondary | ICD-10-CM | POA: Diagnosis not present

## 2021-04-22 DIAGNOSIS — R1031 Right lower quadrant pain: Secondary | ICD-10-CM

## 2021-05-07 ENCOUNTER — Other Ambulatory Visit: Payer: Self-pay | Admitting: Internal Medicine

## 2021-05-07 NOTE — Telephone Encounter (Signed)
   Notes to clinic Not a pt in this practice.  

## 2021-05-10 ENCOUNTER — Encounter: Payer: Self-pay | Admitting: Urology

## 2021-05-10 ENCOUNTER — Ambulatory Visit (INDEPENDENT_AMBULATORY_CARE_PROVIDER_SITE_OTHER): Payer: BC Managed Care – PPO | Admitting: Urology

## 2021-05-10 ENCOUNTER — Other Ambulatory Visit: Payer: Self-pay

## 2021-05-10 VITALS — BP 172/103 | HR 81 | Ht 66.0 in | Wt 197.0 lb

## 2021-05-10 DIAGNOSIS — R1031 Right lower quadrant pain: Secondary | ICD-10-CM

## 2021-05-10 DIAGNOSIS — M6208 Separation of muscle (nontraumatic), other site: Secondary | ICD-10-CM | POA: Diagnosis not present

## 2021-05-10 NOTE — Progress Notes (Signed)
05/10/2021 1:48 PM   Randall Pace Mar 01, 1956 [id number]  Referring provider: Jearld Fenton, NP [place],  [place] [address]  Chief Complaint  Patient presents with   Follow-up    CTscan results    HPI: 65 year old male with a personal history of chronic right groin pain x3 years who returns today with follow-up CT scan  Please see previous notes for details.  CT of the pelvis shows no evidence of inguinal hernia bilaterally.  He does have a known umbilical hernia.  Today, he reports that his groin pain is primarily with transfer from standing to seated position and vice versa especially getting in and out of his car.  The pain can be severe but only last momentarily for the most part.  Its been going on for at least 3 years.  He also complains today of "tearing" of his abdominal wall muscles.  He reports that these never healed properly.  When he does a sit up, he is bulging in his abdomen like an alien.  He stops pain at his umbilical hernia site but this is now gone.  He is very bothered by his rectus diastases.   PMH: Past Medical History:  Diagnosis Date   Arthritis    low back   Diabetes mellitus    GERD (gastroesophageal reflux disease)    History of chicken pox    Hypertension    Migraine    Patellofemoral pain syndrome    Sleep apnea     Surgical History: Past Surgical History:  Procedure Laterality Date   NASAL SINUS SURGERY     palatoplasts     TONSILLECTOMY     uvoloplasty      Home Medications:  Allergies as of 05/10/2021   No Known Allergies      Medication List        Accurate as of May 10, 2021  1:48 PM. If you have any questions, ask your nurse or doctor.          aspirin 81 MG tablet Take 81 mg by mouth daily.   atorvastatin 40 MG tablet Commonly known as: LIPITOR TAKE 1 TABLET(40 MG) BY MOUTH DAILY   cetirizine 10 MG tablet Commonly known as: ZyrTEC Allergy Take 1 tablet (10 mg total) by mouth daily.    fenofibrate 54 MG tablet TAKE 1 TABLET(54 MG) BY MOUTH DAILY   glipiZIDE 10 MG tablet Commonly known as: GLUCOTROL TAKE 1 TABLET BY MOUTH TWICE DAILY BEFORE MEALS   Lancets 30G Misc 1 each by Does not apply route 2 (two) times daily.   metFORMIN 1000 MG tablet Commonly known as: GLUCOPHAGE Take 1 tablet (1,000 mg total) by mouth 2 (two) times daily with a meal.   omeprazole 20 MG capsule Commonly known as: PRILOSEC TAKE 1 CAPSULE(20 MG) BY MOUTH DAILY   ONE TOUCH ULTRA 2 w/Device Kit See admin instructions.   OneTouch Ultra test strip Generic drug: glucose blood USE TO TEST TWICE DAILY   OVER THE COUNTER MEDICATION   sildenafil 100 MG tablet Commonly known as: Viagra Take 0.5-1 tablets (50-100 mg total) by mouth daily as needed for erectile dysfunction.   triamcinolone ointment 0.5 % Commonly known as: KENALOG Apply 1 application topically 2 (two) times daily.   valsartan-hydrochlorothiazide 320-25 MG tablet Commonly known as: DIOVAN-HCT TAKE 1 TABLET BY MOUTH DAILY   verapamil 180 MG CR tablet Commonly known as: CALAN-SR TAKE 1 TABLET(180 MG) BY MOUTH AT BEDTIME  Allergies: No Known Allergies  Family History: Family History  Problem Relation Age of Onset   Cancer Father        prostate    Social History:  reports that he has never smoked. He has never used smokeless tobacco. He reports current alcohol use. He reports that he does not use drugs.   Physical Exam: BP (!) 172/103   Pulse 81   Ht $R'5\' 6"'BD$  (1.676 m)   Wt 197 lb (89.4 kg)   BMI 31.80 kg/m   Constitutional:  Alert and oriented, No acute distress. HEENT: [place] AT, moist mucus membranes.  Trachea midline, no masses. Cardiovascular: No clubbing, cyanosis, or edema. Respiratory: Normal respiratory effort, no increased work of breathing. Skin: No rashes, bruises or suspicious lesions. Neurologic: Grossly intact, no focal deficits, moving all 4 extremities. Psychiatric: Normal mood and  affect.   Pertinent Imaging: Narrative & Impression  CLINICAL DATA:  Right groin pain radiating into right testicle for 3 years   EXAM: CT PELVIS WITHOUT CONTRAST   TECHNIQUE: Multidetector CT imaging of the pelvis was performed following the standard protocol without intravenous contrast. Oral enteric contrast was administered.   COMPARISON:  None.   FINDINGS: Urinary Tract:  No abnormality visualized.   Bowel:  Unremarkable visualized pelvic bowel loops.   Vascular/Lymphatic: No pathologically enlarged lymph nodes. Aortic atherosclerosis.   Reproductive:  No mass or other significant abnormality   Other: Small, fat containing umbilical hernia (series 5, image 94). No inguinal hernia or other abnormality of the right groin to explain pain.   Musculoskeletal: No suspicious bone lesions identified.   IMPRESSION: No inguinal hernia or other abnormality of the right groin to explain pain.   Aortic Atherosclerosis (ICD10-I70.0).     Electronically Signed   By: Eddie Candle M.D.   On: 04/22/2021 12:37     Above CT scan was personally reviewed, agree with radiologic interpretation.  Assessment & Plan:    1. Right groin pain Likely secondary to nerve impingement and/or abdominal/pelvic floor dysfunction  No anatomic issues which are identifiable on exam, CT scan contributing to the above  In light of his symptomatic diastases rectus and chronic right groin pain, I do think he might benefit from physical therapy which could address both.  As above, let us know if he wants a referral to general surgery to address his umbilical hernia - Ambulatory referral to Physical Therapy  2. Diastasis recti As above, let us know if he wants a referral to general surgery to address his umbilical hernia - Ambulatory referral to Physical Therapy   Return if symptoms worsen or fail to improve.  Hollice Espy, MD  [facility] [address]  [place], [place] [address] [phone]

## 2021-05-10 NOTE — Patient Instructions (Signed)
Diastasis Recti  Diastasis recti is a condition in which the muscles of the abdomen (rectus abdominis muscles) become thin and separate. The result is a wider space between the muscles of the right and left abdomen (abdominal muscles). This wider space between the muscles may cause a bulge in the middle of the abdomen. This bulge may be noticed when a person is straining or when he or shesits up after lying down. Diastasis recti can affect men and women. It is most common among pregnant women, babies, people with obesity, and people who have had abdominal surgery.Exercise or surgery may help correct this condition. What are the causes? Common causes of this condition include: Pregnancy. As the uterus grows in size, it puts pressure on the abdominal muscles, causing the muscles to separate. Obesity. Excess fat puts pressure on abdominal muscles. Weight lifting. Some exercises of the abdomen. Advanced age. Genetics. Having had surgery on the abdomen before. What increases the risk? This condition is more likely to develop in: Women. Newborns, especially newborns who are born early (prematurely). What are the signs or symptoms? Common symptoms of this condition include: A bulge in the middle of your abdomen. You will notice it most when you sit up or strain. Pain in your low back, hips, or the area between your hip bones (pelvis). Constipation. Being unable to control when you urinate (urinary incontinence). Bloating. Poor posture. How is this diagnosed? This condition is diagnosed with a physical exam. During the exam, your health care provider will ask you to lie flat on your back and do a crunch or half sit-up. If you have diastasis recti, a bulge will appear lengthwise between your abdominal muscles in the center of your abdomen. Your health care provider will measure the gap between your muscles with one of the following: A medical device used to measure the space between two objects  (caliper). A tape measure. CT scan. Ultrasound. Finger spaces. Your health care provider will measure the space using his or her fingers. How is this treated? If your muscle separation is not too large, you may not need treatment. However, if you are a woman who plans to become pregnant again, you should treat this condition before your next pregnancy. Treatment may include: Physical therapy exercises to strengthen and tighten your abdominal muscles. Lifestyle changes such as weight loss and exercise. Over-the-counter pain medicines as needed. Surgery to correct the separation. Follow these instructions at home: Activity Return to your normal activities as told by your health care provider. Ask your health care provider what activities are safe for you. Do exercises as told by your health care provider. Make sure you are doing your exercises and movements correctly when lifting weights or doing exercises using your abdominal muscles or the muscles in the center of your body that give stability (core muscles). Proper form can help to prevent this condition from happening again. General instructions If you are overweight, ask your health care provider for help with weight loss. Losing even a small amount of weight can help to improve your diastasis recti. Take over-the-counter or prescription medicines only as told by your health care provider. Do not strain. Straining can make the separation worse. Examples of straining include: Pushing hard to have a bowel movement, such as when you have constipation. Lifting heavy objects or lifting children. Standing up and sitting down. You may need to take these actions to prevent or treat constipation: Drink enough fluid to keep your urine pale yellow. Take over-the-counter or  or prescription medicines. °Eat foods that are high in fiber, such as beans, whole grains, and fresh fruits and vegetables. °Limit foods that are high in fat and processed sugars,  such as fried or sweet foods. °Keep all follow-up visits. This is important. °Contact a health care provider if: °You notice a new bulge in your abdomen. °Get help right away if: °You experience severe discomfort in your abdomen. °You develop severe abdominal pain along with nausea, vomiting, or a fever. °Summary °Diastasis recti is a condition in which the muscles of the abdomen (rectus abdominismuscles) become thin and separate. You may notice a bulge in your abdomen because the space has widened between the muscles of the right and left abdomen. °The most common symptom is a bulge in the middle of your abdomen. You will notice it most when you sit up or strain. °This condition is diagnosed with a physical exam. °If the muscle separation is not too big, you may not need treatment. Otherwise, you may need to do physical therapy or have surgery. °This information is not intended to replace advice given to you by your health care provider. Make sure you discuss any questions you have with your health care provider. °Document Revised: 06/18/2020 Document Reviewed: 06/18/2020 °Elsevier Patient Education © 2022 [employer] Inc[employer] ° °

## 2021-05-15 ENCOUNTER — Other Ambulatory Visit: Payer: Self-pay | Admitting: Internal Medicine

## 2021-05-15 NOTE — Telephone Encounter (Signed)
Pt of [facility]- 

## 2021-05-24 ENCOUNTER — Telehealth: Payer: Self-pay | Admitting: Internal Medicine

## 2021-05-24 ENCOUNTER — Telehealth: Payer: Self-pay

## 2021-05-24 NOTE — Telephone Encounter (Signed)
They are up front. Please fax.

## 2021-05-24 NOTE — Telephone Encounter (Signed)
Mr. Randall Pace called in and wanted to know about getting his lab results he is going to another doctor that needs them and he is going today @1030 

## 2021-05-24 NOTE — Telephone Encounter (Signed)
New message    Patient is asking for the forms to be fax   Fax # [phone]

## 2021-05-24 NOTE — Telephone Encounter (Signed)
Called pt to find out which labs he needs because he can get them from Mychart to save him some time. He said he cannot get logged on to MyChart. Printed labs and put up front for pickup.

## 2021-07-12 DIAGNOSIS — E113293 Type 2 diabetes mellitus with mild nonproliferative diabetic retinopathy without macular edema, bilateral: Secondary | ICD-10-CM | POA: Diagnosis not present

## 2021-07-12 DIAGNOSIS — H524 Presbyopia: Secondary | ICD-10-CM | POA: Diagnosis not present

## 2021-07-20 ENCOUNTER — Telehealth: Payer: Self-pay | Admitting: Internal Medicine

## 2021-08-04 ENCOUNTER — Telehealth: Payer: Self-pay | Admitting: Internal Medicine

## 2021-08-04 NOTE — Telephone Encounter (Signed)
Open in error

## 2021-08-04 NOTE — Telephone Encounter (Signed)
Pt called stating that [facility] needs approval for refill and states they are faxing over a form. Pt is requesting a call when it has been completed.

## 2021-08-05 NOTE — Telephone Encounter (Signed)
Started PA on Cover My Meds. Pt will receive a fax with the approval or denial at the same time we receive it.

## 2021-08-05 NOTE — Telephone Encounter (Signed)
Spoke to pt

## 2021-08-16 NOTE — Telephone Encounter (Signed)
Received approval 07-06-21 to 08-05-22

## 2021-09-20 ENCOUNTER — Encounter: Payer: BC Managed Care – PPO | Admitting: Internal Medicine

## 2021-10-11 ENCOUNTER — Telehealth (INDEPENDENT_AMBULATORY_CARE_PROVIDER_SITE_OTHER): Payer: BC Managed Care – PPO | Admitting: Family Medicine

## 2021-10-11 ENCOUNTER — Other Ambulatory Visit: Payer: Self-pay

## 2021-10-11 ENCOUNTER — Encounter: Payer: Self-pay | Admitting: Family Medicine

## 2021-10-11 VITALS — BP 169/102 | HR 104 | Temp 98.1°F | Ht 66.0 in

## 2021-10-11 DIAGNOSIS — I10 Essential (primary) hypertension: Secondary | ICD-10-CM | POA: Diagnosis not present

## 2021-10-11 DIAGNOSIS — R051 Acute cough: Secondary | ICD-10-CM | POA: Insufficient documentation

## 2021-10-11 MED ORDER — HYDROCODONE BIT-HOMATROP MBR 5-1.5 MG/5ML PO SOLN
5.0000 mL | Freq: Every evening | ORAL | 0 refills | Status: DC | PRN
Start: 2021-10-11 — End: 2021-11-01

## 2021-10-11 MED ORDER — GUAIFENESIN-CODEINE 100-10 MG/5ML PO SYRP: 5.0000 mL | ORAL_SOLUTION | Freq: Every evening | ORAL | 0 refills | Status: DC | PRN

## 2021-10-11 NOTE — Addendum Note (Signed)
Addended by: Kerby Nora E on: 10/11/2021 04:59 PM   Modules accepted: Orders

## 2021-10-11 NOTE — Progress Notes (Signed)
Work note written as instructed by Dr. Mirian Mo and mailed to patient.

## 2021-10-11 NOTE — Progress Notes (Signed)
VIRTUAL VISIT Due to national recommendations of social distancing due to [place] 19, a virtual visit is felt to be most appropriate for this patient at this time.   I connected with the patient on 10/11/21 at 10:40 AM EST by virtual telehealth platform and verified that I am speaking with the correct person using two identifiers.   I discussed the limitations, risks, security and privacy concerns of performing an evaluation and management service by  virtual telehealth platform and the availability of in person appointments. I also discussed with the patient that there may be a patient responsible charge related to this service. The patient expressed understanding and agreed to proceed.  Patient location: Home Provider Location: [facility] Participants: Randall Pace and Elpidio Eric   Chief Complaint  Patient presents with   Cough    Worse at night-Unable to sleep-Symptoms started Wednesday-Was around [place] who was positive for Flu-No Covid Test done   Chest Congestion   Headache    On and Off   Nasal Congestion   Scheduled Induction   Sciatica    Left     History of Present Illness:  65 year old male patient of Dr. Alla German with history of HTN, DM and lumbar degenerative disc disease presents with new onset cough.   Date of Onset: 10/05/2021   He reports symptoms started with nasal congestion and headache.. progressed to chest congestion,  moist but nonproductive cough. Headache off and on, ST.  Fever initially... low grade ( he usually runs 96) 99.28F.  No new body aches.  Some chest tightness, no wheeze. Has to breath through mouth.  No wheeze  Cough keeping up at night.   Nonsmoker, no chronic lung disease   Taking Dayquil, robitussin,  tussin DM.  HTN, elevated today.. on diovan HCT.. has not taken it yet. BP Readings from Last 3 Encounters:  10/11/21 (!) 169/102  05/10/21 (!) 172/103  04/05/21 (!) 175/82     COVID 19 screen COVID testing:none COVID  vaccine:01/26/2020 , 01/05/2020 COVID exposure: No recent travel or known exposure to [place].Marland Kitchen DOES HAVE EXPOSURE TOFLU  The importance of social distancing was discussed today.   ROS    Past Medical History:  Diagnosis Date   Arthritis    low back   Diabetes mellitus    GERD (gastroesophageal reflux disease)    History of chicken pox    Hypertension    Migraine    Patellofemoral pain syndrome    Sleep apnea     reports that he has never smoked. He has never used smokeless tobacco. He reports current alcohol use. He reports that he does not use drugs.   Current Outpatient Medications:    aspirin 81 MG tablet, Take 81 mg by mouth daily., Disp: , Rfl:    atorvastatin (LIPITOR) 40 MG tablet, TAKE 1 TABLET(40 MG) BY MOUTH DAILY, Disp: 90 tablet, Rfl: 3   Blood Glucose Monitoring Suppl (ONE TOUCH ULTRA 2) w/Device KIT, See admin instructions., Disp: , Rfl:    cetirizine (ZYRTEC ALLERGY) 10 MG tablet, Take 1 tablet (10 mg total) by mouth daily., Disp: 30 tablet, Rfl: 6   fenofibrate 54 MG tablet, TAKE 1 TABLET(54 MG) BY MOUTH DAILY, Disp: 90 tablet, Rfl: 3   glipiZIDE (GLUCOTROL) 10 MG tablet, TAKE 1 TABLET BY MOUTH TWICE DAILY BEFORE MEALS, Disp: 180 tablet, Rfl: 3   Lancets 30G MISC, 1 each by Does not apply route 2 (two) times daily., Disp: 200 each, Rfl: 1   metFORMIN (  GLUCOPHAGE) 1000 MG tablet, Take 1 tablet (1,000 mg total) by mouth 2 (two) times daily with a meal., Disp: 180 tablet, Rfl: 3   omeprazole (PRILOSEC) 20 MG capsule, TAKE 1 CAPSULE(20 MG) BY MOUTH DAILY, Disp: 90 capsule, Rfl: 3   ONETOUCH ULTRA test strip, USE TO TEST TWICE DAILY, Disp: 200 strip, Rfl: 1   sildenafil (VIAGRA) 100 MG tablet, TAKE 1/2 TO 1 TABLET(50 TO 100 MG) BY MOUTH DAILY AS NEEDED FOR ERECTILE DYSFUNCTION, Disp: 10 tablet, Rfl: 11   triamcinolone ointment (KENALOG) 0.5 %, Apply 1 application topically 2 (two) times daily., Disp: 30 g, Rfl: 0   valsartan-hydrochlorothiazide (DIOVAN-HCT) 320-25 MG tablet,  TAKE 1 TABLET BY MOUTH DAILY, Disp: 90 tablet, Rfl: 3   verapamil (CALAN-SR) 180 MG CR tablet, TAKE 1 TABLET(180 MG) BY MOUTH AT BEDTIME, Disp: 90 tablet, Rfl: 3   Observations/Objective: Blood pressure (!) 169/102, pulse (!) 104, temperature 98.1 F (36.7 C), temperature source Axillary, height $RemoveBeforeDE'5\' 6"'[id number]$  (1.676 m).  Physical Exam  Physical Exam Constitutional:      General: The patient is not in acute distress. Pulmonary:     Effort: Pulmonary effort is normal. No respiratory distress.  Neurological:     Mental Status: The patient is alert and oriented to person, place, and time.  Psychiatric:        Mood and Affect: Mood normal.        Behavior: Behavior normal.   Assessment and Plan Problem List Items Addressed This Visit     Acute cough - Primary    Likely influenza given exposure .. pt on day6 of illness... supportive care.  treat with cough suppressant.Agree.  Thanks.  Void decongestants, push fluids. Return and ER precautions given... if worsening call for re-evaluation. Go to Er if severe shortness of breath.       Essential hypertension     Recommend avoid decongestants, go ahead and take BP med ASAP.. follow BP at home.         I discussed the assessment and treatment plan with the patient. The patient was provided an opportunity to ask questions and all were answered. The patient agreed with the plan and demonstrated an understanding of the instructions.   The patient was advised to call back or seek an in-person evaluation if the symptoms worsen or if the condition fails to improve as anticipated.     Randall Lofts, MD

## 2021-10-11 NOTE — Assessment & Plan Note (Signed)
Recommend avoid decongestants, go ahead and take BP med ASAP.. follow BP at home.

## 2021-10-11 NOTE — Assessment & Plan Note (Signed)
Likely influenza given exposure .. pt on day6 of illness... supportive care.  treat with cough suppressant.Agree.  Thanks.  Void decongestants, push fluids. Return and ER precautions given... if worsening call for re-evaluation. Go to Er if severe shortness of breath.

## 2021-10-12 ENCOUNTER — Telehealth: Payer: Self-pay

## 2021-10-12 NOTE — Telephone Encounter (Signed)
[name], MD to Me  Eual Fines, CMA     10:12 AM Noted  I agree with in person evaluation if not improving tomorrow--can add on with me at 1:45 if needed   I spoke with pt and he thinks he feels better right now but is not sure how he will do tonight. Pt notified as instructed with Dr Vassie Moselle comments and pt said he will see how he does tonight and if pt needs appt he will call 8 AM on 10/13/21. Sending note to [facility] support and [facility] CMA.

## 2021-10-12 NOTE — Telephone Encounter (Addendum)
Unable to reach pt or pts wife (DPR signed); sending note to Dr Alphonsus Sias who is PCP due to Dr Ermalene Searing being out of office but did video visit with pt on 10/11/21.sending note also to [facility] CMA. I spoke with pt after calling again; pt said was up and down last night; pt denies SOB, and no CP. Pt said he does have chest tightness. Pt said he thinks not sleeping has worsened all his symptoms. Pt said [facility] back and advised had med and pt started medication; pt thought he was feeling some better and then began to feel worse. Pt has a degree of fever. Pt said he is going to give the med a day to work and if not better will go to [facility] or ED. Pt said if condition worsens further he will go to UC or ED. Sending note to Dr Ermalene Searing who is out of office as FYI.

## 2021-10-12 NOTE — Telephone Encounter (Signed)
[facility] Night - Client TELEPHONE ADVICE RECORD AccessNurse Patient Name: Randall Pace Gender: Male DOB: Jun 01, 1956 Age: 65 Y 9 M 1 D Return Phone Number: [phone] (Primary) Address: [place]  [address] Client [facility] Night - Client Client Site [facility] [place] - Night Provider Kerby Nora - MD Contact Type Call Who Is Calling Patient / Member / Family / Caregiver Call Type Triage / Clinical Relationship To Patient Self Return Phone Number [phone] (Primary) Chief Complaint Headache Reason for Call Medication Question / Request Initial Comment Caller states the Rx is on back order for 2-3 weeks according to [facility]. He is having a headache and sore throat Translation No Nurse Assessment Nurse: Clare Gandy, RN, Dahlia Client Date/Time (Eastern Time): 10/11/2021 5:28:25 PM Confirm and document reason for call. If symptomatic, describe symptoms. ---Caller reports having sore throat, headache, chest tightness, difficulty breathing, coughing symptoms started Friday. Caller reports exposure to flu. Does the patient have any new or worsening symptoms? ---Yes Will a triage be completed? ---Yes Related visit to physician within the last 2 weeks? ---Yes Does the PT have any chronic conditions? (i.e. diabetes, asthma, this includes High risk factors for pregnancy, etc.) ---Yes List chronic conditions. ---Diabetic, HTN, Sleep apnea Is this a behavioral health or substance abuse call? ---No Guidelines Guideline Title Affirmed Question Affirmed Notes Nurse Date/Time (Eastern Time) Influenza - Seasonal [1] Difficulty breathing AND [2] not severe AND [3] not from stuffy nose (e.g., not relieved by cleaning out the nose) Clare Gandy, RN, Dahlia Client 10/11/2021 5:32:24 PM Disp. Time Lamount Cohen Time) Disposition Final User 10/11/2021 5:40:36 PM Go to ED Now (or PCP triage) Yes Riddle, RN,  Dahlia Client PLEASE NOTE: All timestamps contained within this report are represented as [place] Standard Time. CONFIDENTIALTY NOTICE: This fax transmission is intended only for the addressee. It contains information that is legally privileged, confidential or otherwise protected from use or disclosure. If you are not the intended recipient, you are strictly prohibited from reviewing, disclosing, copying using or disseminating any of this information or taking any action in reliance on or regarding this information. If you have received this fax in error, please notify us immediately by telephone so that we can arrange for its return to [place]. Phone: [phone], Toll-Free: [phone], Fax: [phone] Page: 2 of 2 Call Id: [id number] Caller Disagree/Comply Disagree Caller Understands Yes PreDisposition Call Doctor Care Advice Given Per Guideline GO TO ED NOW (OR PCP TRIAGE): * IF NO PCP (PRIMARY CARE PROVIDER) SECOND-LEVEL TRIAGE: You need to be seen within the next hour. Go to the ED/UCC at _____________ Hospital. Leave as soon as you can. CARE ADVICE given per Influenza - Seasonal (Adult) guideline. Comments User: Hyacinth Meeker, RN Date/Time Lamount Cohen Time): 10/11/2021 5:43:20 PM Caller refused advised disposition. States he thinks a good nights sleep will help. Advised patient if any changes in condition occur to call back. verbalized understanding. Referrals GO TO FACILITY REFUSE

## 2021-10-13 ENCOUNTER — Encounter: Payer: Self-pay | Admitting: Internal Medicine

## 2021-10-13 ENCOUNTER — Other Ambulatory Visit: Payer: Self-pay

## 2021-10-13 ENCOUNTER — Ambulatory Visit (INDEPENDENT_AMBULATORY_CARE_PROVIDER_SITE_OTHER): Payer: BC Managed Care – PPO | Admitting: Internal Medicine

## 2021-10-13 DIAGNOSIS — R059 Cough, unspecified: Secondary | ICD-10-CM | POA: Insufficient documentation

## 2021-10-13 NOTE — Assessment & Plan Note (Signed)
Seems to have a post infectious cough Likely had the flu--but seems to be better now (nothing to suggest pneumonia or secondary bacterial infection) Okay to use the hydrocodone syrup at night--and can repeat prn Can also try OTC DM night and day reassured

## 2021-10-13 NOTE — Progress Notes (Signed)
Subjective:    Patient ID: Randall Pace, male    DOB: [birth year], 65 y.o.   MRN: [id number]  HPI Here due to ongoing cough  Works outside--last week we had the rain He is sensitive to pollen as well Had some sore throat and congestion Thought this acted up---then exposed to grandson who was diagnosed with flu  6 days ago--had low grade fever Felt cold and hot Some sweat Cough started then but progressed No SOB now  He is still having cough at night when he lies down Gets feeling of choking in his throat It leads to a headache He did get narcotic cough syrup---it may have helped "but didn't take him through the night"  Current Outpatient Medications on File Prior to Visit  Medication Sig Dispense Refill   aspirin 81 MG tablet Take 81 mg by mouth daily.     atorvastatin (LIPITOR) 40 MG tablet TAKE 1 TABLET(40 MG) BY MOUTH DAILY 90 tablet 3   Blood Glucose Monitoring Suppl (ONE TOUCH ULTRA 2) w/Device KIT See admin instructions.     cetirizine (ZYRTEC ALLERGY) 10 MG tablet Take 1 tablet (10 mg total) by mouth daily. 30 tablet 6   fenofibrate 54 MG tablet TAKE 1 TABLET(54 MG) BY MOUTH DAILY 90 tablet 3   glipiZIDE (GLUCOTROL) 10 MG tablet TAKE 1 TABLET BY MOUTH TWICE DAILY BEFORE MEALS 180 tablet 3   HYDROcodone bit-homatropine (HYCODAN) 5-1.5 MG/5ML syrup Take 5 mLs by mouth at bedtime as needed for cough. 120 mL 0   Lancets 30G MISC 1 each by Does not apply route 2 (two) times daily. 200 each 1   metFORMIN (GLUCOPHAGE) 1000 MG tablet Take 1 tablet (1,000 mg total) by mouth 2 (two) times daily with a meal. 180 tablet 3   omeprazole (PRILOSEC) 20 MG capsule TAKE 1 CAPSULE(20 MG) BY MOUTH DAILY 90 capsule 3   ONETOUCH ULTRA test strip USE TO TEST TWICE DAILY 200 strip 1   sildenafil (VIAGRA) 100 MG tablet TAKE 1/2 TO 1 TABLET(50 TO 100 MG) BY MOUTH DAILY AS NEEDED FOR ERECTILE DYSFUNCTION 10 tablet 11   triamcinolone ointment (KENALOG) 0.5 % Apply 1 application topically 2 (two)  times daily. 30 g 0   valsartan-hydrochlorothiazide (DIOVAN-HCT) 320-25 MG tablet TAKE 1 TABLET BY MOUTH DAILY 90 tablet 3   verapamil (CALAN-SR) 180 MG CR tablet TAKE 1 TABLET(180 MG) BY MOUTH AT BEDTIME 90 tablet 3   No current facility-administered medications on file prior to visit.    No Known Allergies  Past Medical History:  Diagnosis Date   Arthritis    low back   Diabetes mellitus    GERD (gastroesophageal reflux disease)    History of chicken pox    Hypertension    Migraine    Patellofemoral pain syndrome    Sleep apnea     Past Surgical History:  Procedure Laterality Date   NASAL SINUS SURGERY     palatoplasts     TONSILLECTOMY     uvoloplasty      Family History  Problem Relation Age of Onset   Cancer Father        prostate    Social History   Socioeconomic History   Marital status: Married    Spouse name: Not on file   Number of children: 2   Years of education: Not on file   Highest education level: Not on file  Occupational History   Occupation: Scientist, clinical (histocompatibility and immunogenetics): [employer]  Tobacco Use  Smoking status: Never   Smokeless tobacco: Never  Vaping Use   Vaping Use: Never used  Substance and Sexual Activity   Alcohol use: Yes    Comment: occasional   Drug use: No   Sexual activity: Yes  Other Topics Concern   Not on file  Social History Narrative   Not on file   Social Determinants of Health   Financial Resource Strain: Not on file  Food Insecurity: Not on file  Transportation Needs: Not on file  Physical Activity: Not on file  Stress: Not on file  Social Connections: Not on file  Intimate Partner Violence: Not on file   Review of Systems Hasn't gone back to work No N/V Some constipation Eating okay--but appetite is down     Objective:   Physical Exam Constitutional:      Appearance: Normal appearance.  HENT:     Right Ear: Tympanic membrane and ear canal normal.     Left Ear: Tympanic membrane and ear canal normal.      Nose:     Comments: Moderate nasal congestion with inflammation    Mouth/Throat:     Pharynx: No oropharyngeal exudate or posterior oropharyngeal erythema.  Pulmonary:     Effort: Pulmonary effort is normal.     Breath sounds: Normal breath sounds. No wheezing, rhonchi or rales.  Musculoskeletal:     Cervical back: Neck supple.  Lymphadenopathy:     Cervical: No cervical adenopathy.  Neurological:     Mental Status: He is alert.           Assessment & Plan:

## 2021-10-13 NOTE — Telephone Encounter (Signed)
Agree with in person exam.

## 2021-10-13 NOTE — Telephone Encounter (Signed)
I tried calling pt and his wife and no answer to see if pt wanted appt this afternoon with Dr Alphonsus Sias. Sending note to [facility] CMA.

## 2021-10-13 NOTE — Telephone Encounter (Signed)
Started last Friday with symptoms. Exposed to the flu by grandson. Still has chest congestion and cough. Hycodan not really helping much. Made appt at 245 today in office. Gave directions to [facility] office.

## 2021-10-18 ENCOUNTER — Telehealth: Payer: Self-pay | Admitting: Internal Medicine

## 2021-10-18 ENCOUNTER — Ambulatory Visit
Admission: EM | Admit: 2021-10-18 | Discharge: 2021-10-18 | Disposition: A | Discharge status: Home / Self Care | Payer: BC Managed Care – PPO | Attending: Family Medicine | Admitting: Family Medicine

## 2021-10-18 ENCOUNTER — Encounter: Payer: Self-pay | Admitting: Emergency Medicine

## 2021-10-18 ENCOUNTER — Ambulatory Visit (INDEPENDENT_AMBULATORY_CARE_PROVIDER_SITE_OTHER): Payer: BC Managed Care – PPO

## 2021-10-18 DIAGNOSIS — J22 Unspecified acute lower respiratory infection: Secondary | ICD-10-CM | POA: Diagnosis not present

## 2021-10-18 DIAGNOSIS — R509 Fever, unspecified: Secondary | ICD-10-CM

## 2021-10-18 DIAGNOSIS — R059 Cough, unspecified: Secondary | ICD-10-CM | POA: Diagnosis not present

## 2021-10-18 DIAGNOSIS — R0602 Shortness of breath: Secondary | ICD-10-CM | POA: Diagnosis not present

## 2021-10-18 LAB — POCT INFLUENZA A/B
Influenza A, POC: NEGATIVE
Influenza B, POC: NEGATIVE

## 2021-10-18 MED ORDER — ALBUTEROL SULFATE HFA 108 (90 BASE) MCG/ACT IN AERS: 2.0000 | INHALATION_SPRAY | Freq: Four times a day (QID) | RESPIRATORY_TRACT | 0 refills | Status: DC | PRN

## 2021-10-18 MED ORDER — BENZONATATE 100 MG PO CAPS: 200.0000 mg | ORAL_CAPSULE | Freq: Three times a day (TID) | ORAL | 0 refills | Status: DC | PRN

## 2021-10-18 MED ORDER — DOXYCYCLINE HYCLATE 100 MG PO CAPS: 100.0000 mg | ORAL_CAPSULE | Freq: Two times a day (BID) | ORAL | 0 refills | Status: DC

## 2021-10-18 NOTE — Telephone Encounter (Signed)
Patient called to see if his provider had any appointments today  Patient is still coughing, not getting any sleep, had diarrhea yesterday, ending in lots of gas last night  States he is taking the cough syrup  Transferred to team health for further triage or suggestions for this patient  Offered to look for another provider for a visit today, but patient only wants to see his MD

## 2021-10-18 NOTE — Telephone Encounter (Signed)
[name], MD to Eual Fines, CMA  Me      9:51 AM Please let him know that my last appt was just taken.  I am also already full tomorrow.  Please try to convince him to see someone else or go to urgent care.  I could add on for Thursday---but he probably needs visit before then

## 2021-10-18 NOTE — ED Triage Notes (Addendum)
Pt c/o cough, chest congestion, fever, some SOB, fatigue, HA x 1 week

## 2021-10-18 NOTE — Telephone Encounter (Signed)
Randall Pace with access nurse calls with pt on phone also. Pt has appt to see Dr Alphonsus Sias on 10/19/21 at 9:15 for hip and leg pain. Pt wants to be seen sooner for cough and h/a; no fever now but pt was exposed to flu last wk with his grandson. No available appts today at [facility] and pt wants to be seen in person. Pt is going to [facility] in [place] today for cough and h/a but pt wants to keep appt on 10/19/21 with Dr Alphonsus Sias to ck hip and leg pain. Randall Pace requested pt to have covid and flu test done at [facility] when seen this morning. Pt voiced understanding. Sending note to Dr Alphonsus Sias and Carollee Herter CMA. Again pt did not cancel appt on 10/19/21 with Dr Alphonsus Sias.

## 2021-10-18 NOTE — Telephone Encounter (Signed)
Tried to call pt, VM is full. He is scheduled to see Dr Alphonsus Sias tomorrow at 915 for back pain. We will try to address the cough then.

## 2021-10-18 NOTE — Telephone Encounter (Signed)
[facility] Day - Client TELEPHONE ADVICE RECORD AccessNurse Patient Name: Randall Pace Gender: Male DOB: September 06, 1956 Age: 65 Y 9 M 8 D Return Phone Number: [phone] (Primary) Address: [place]  [address] Client [name] Day - Client Client Site [facility] [place] - Day Provider Tillman Abide- MD Contact Type Call Who Is Calling Patient / Member / Family / Caregiver Call Type Triage / Clinical Relationship To Patient Self Return Phone Number [phone] (Primary) Chief Complaint Diarrhea Reason for Call Symptomatic / Request for Health Information Initial Comment Call trans from Dr. Alphonsus Sias office// appt scheduled for tomorrow caller is coughing alot with headache and not sleeping also also diarrhea  caller states he cant cough up the pleghm GOTO Facility Not Listed [facility] Translation No Nurse Assessment Nurse: Carylon Perches, RN, Hilda Lias Date/Time (Eastern Time): 10/18/2021 9:53:58 AM Confirm and document reason for call. If symptomatic, describe symptoms. ---Caller states for a week he is coughing alot. Not sleeping because of it. Its very dry and nothing is coming up. Taking Robitussin DM. He has a headache from coughing. Does the patient have any new or worsening symptoms? ---Yes Will a triage be completed? ---Yes Related visit to physician within the last 2 weeks? ---No Does the PT have any chronic conditions? (i.e. diabetes, asthma, this includes High risk factors for pregnancy, etc.) ---Yes List chronic conditions. ---HTN, diabetes, GERD Is this a behavioral health or substance abuse call? ---No Guidelines Guideline Title Affirmed Question Affirmed Notes Nurse Date/Time (Eastern Time) Cough - Acute NonProductive SEVERE coughing spells (e.g., whooping sound after coughing, vomiting after coughing) Carylon Perches, RN, Hilda Lias 10/18/2021 9:57:35 AM Headache [1]  SEVERE headache (e.g., Carylon Perches, RN, Hilda Lias 10/18/2021 10:02:10 AM PLEASE NOTE: All timestamps contained within this report are represented as [place] Standard Time. CONFIDENTIALTY NOTICE: This fax transmission is intended only for the addressee. It contains information that is legally privileged, confidential or otherwise protected from use or disclosure. If you are not the intended recipient, you are strictly prohibited from reviewing, disclosing, copying using or disseminating any of this information or taking any action in reliance on or regarding this information. If you have received this fax in error, please notify us immediately by telephone so that we can arrange for its return to [place]. Phone: [phone], Toll-Free: [phone], Fax: [phone] Page: 2 of 2 Call Id: [id number] Guidelines Guideline Title Affirmed Question Affirmed Notes Nurse Date/Time Lamount Cohen Time) excruciating) AND [2] not improved after 2 hours of pain medicine Disp. Time Lamount Cohen Time) Disposition Final User 10/18/2021 10:01:09 AM See PCP within 24 Hours Carylon Perches, RN, Hilda Lias 10/18/2021 10:13:41 AM See HCP within 4 Hours (or PCP triage) Yes Carylon Perches, RN, [place] Grater Disagree/Comply Comply Caller Understands Yes PreDisposition Did not know what to do Care Advice Given Per Guideline SEE PCP WITHIN 24 HOURS: * IF OFFICE WILL BE OPEN: You need to be examined within the next 24 hours. Call your doctor (or NP/PA) when the office opens and make an appointment. CARE ADVICE given per Cough - Acute Non-Productive (Adult) guideline. CALL BACK IF: SEE HCP (OR PCP TRIAGE) WITHIN 4 HOURS: * IF OFFICE WILL BE OPEN: You need to be seen within the next 3 or 4 hours. Call your doctor (or NP/PA) now or as soon as the office opens. CALL BACK IF: * You become worse CARE ADVICE given per Headache (Adult) guideline. ANOTHER ADULT SHOULD DRIVE: * It is better and safer if another adult drives  instead of you. Referrals GO TO  FACILITY OTHER - SPECIF

## 2021-10-18 NOTE — Telephone Encounter (Signed)
Please see notes below the access nurse note; per chart review tab pt is at [facility] in [place] (pt still wanted to keep appt with Dr Alphonsus Sias in office for hip and leg pain; see note below access nurse).

## 2021-10-18 NOTE — Discharge Instructions (Addendum)
Treating you today for a lower respiratory infection.  Start doxycycline twice daily for 10 days.  Tessalon Perles take 2 for cough 3 times daily as needed.  Given your audible wheezing on exam I am placing you on an albuterol inhaler use every 4-6 hours as needed for wheezing and or shortness of breath or any chest tightness. Chest x-ray is negative for pneumonia.  Given the severity of her symptoms at the course of the last 7 days I am checking a PCR COVID and influenza test to rule out either as a source of your symptoms.  Your test should result within the next 3 days. If any of your symptoms worsen or you develop any chest pain or severe difficulty with breathing is not relieved with albuterol go immediately to the emergency department.

## 2021-10-18 NOTE — ED Provider Notes (Signed)
[place]    CSN: [id number] Arrival date & time: 10/18/21  1109      History   Chief Complaint Chief Complaint  Patient presents with   Fever   Cough   Nasal Congestion   Shortness of Breath    HPI Randall Pace is a 65 y.o. male.   HPI Patient presents today with chest congestion, fever, shortness of breath, fatigue, and headache x one week.  He reports feeling dizzy. He phone his PCP regarding his cough earlier this week and was prescribed cough medication. He reports minimal relief. He has a history of sleep apnea, although denies history of asthma or COPD.   Past Medical History:  Diagnosis Date   Arthritis    low back   Diabetes mellitus    GERD (gastroesophageal reflux disease)    History of chicken pox    Hypertension    Migraine    Patellofemoral pain syndrome    Sleep apnea     Patient Active Problem List   Diagnosis Date Noted   Cough 10/13/2021   Acute cough 10/11/2021   Right groin pain 03/23/2021   Aortic heart murmur 03/23/2021   Lumbar spondylosis with myelopathy 06/18/2020   DM type 2 with diabetic peripheral neuropathy ([place]) 06/18/2020   Trochanteric bursitis of right hip 06/18/2020   Lumbar degenerative disc disease 06/18/2020   HLD (hyperlipidemia) 02/25/2018   OSA (obstructive sleep apnea) 02/25/2018   Erectile dysfunction 02/25/2018   Generalized osteoarthritis of multiple sites 09/25/2014   Essential hypertension 12/14/2008   GERD 12/14/2008    Past Surgical History:  Procedure Laterality Date   NASAL SINUS SURGERY     palatoplasts     TONSILLECTOMY     uvoloplasty         Home Medications    Prior to Admission medications   Medication Sig Start Date End Date Taking? Authorizing Provider  albuterol (VENTOLIN HFA) 108 (90 Base) MCG/ACT inhaler Inhale 2 puffs into the lungs every 6 (six) hours as needed for wheezing or shortness of breath. 10/18/21  Yes Scot Jun, FNP  benzonatate (TESSALON) 100 MG  capsule Take 2 capsules (200 mg total) by mouth 3 (three) times daily as needed for cough. 10/18/21  Yes Scot Jun, FNP  doxycycline (VIBRAMYCIN) 100 MG capsule Take 1 capsule (100 mg total) by mouth 2 (two) times daily. 10/18/21  Yes Scot Jun, FNP  aspirin 81 MG tablet Take 81 mg by mouth daily.    [provider]  atorvastatin (LIPITOR) 40 MG tablet TAKE 1 TABLET(40 MG) BY MOUTH DAILY 05/11/21   Venia Carbon, MD  Blood Glucose Monitoring Suppl (ONE TOUCH ULTRA 2) w/Device KIT See admin instructions. 11/05/19   [provider]  cetirizine (ZYRTEC ALLERGY) 10 MG tablet Take 1 tablet (10 mg total) by mouth daily. 09/18/18   Flora Lipps, MD  fenofibrate 54 MG tablet TAKE 1 TABLET(54 MG) BY MOUTH DAILY 05/11/21   Viviana Simpler I, MD  glipiZIDE (GLUCOTROL) 10 MG tablet TAKE 1 TABLET BY MOUTH TWICE DAILY BEFORE MEALS 05/11/21   Venia Carbon, MD  HYDROcodone bit-homatropine (HYCODAN) 5-1.5 MG/5ML syrup Take 5 mLs by mouth at bedtime as needed for cough. 10/11/21   Bedsole, Amy E, MD  Lancets 30G MISC 1 each by Does not apply route 2 (two) times daily. 10/21/19   Jearld Fenton, NP  metFORMIN (GLUCOPHAGE) 1000 MG tablet Take 1 tablet (1,000 mg total) by mouth 2 (two) times daily  with a meal. 03/23/21   Venia Carbon, MD  omeprazole (PRILOSEC) 20 MG capsule TAKE 1 CAPSULE(20 MG) BY MOUTH DAILY 05/16/21   Venia Carbon, MD  [facility] ULTRA test strip USE TO TEST TWICE DAILY 05/07/20   Jearld Fenton, NP  sildenafil (VIAGRA) 100 MG tablet TAKE 1/2 TO 1 TABLET(50 TO 100 MG) BY MOUTH DAILY AS NEEDED FOR ERECTILE DYSFUNCTION 07/25/21   Venia Carbon, MD  triamcinolone ointment (KENALOG) 0.5 % Apply 1 application topically 2 (two) times daily. 01/31/18   Jearld Fenton, NP  valsartan-hydrochlorothiazide (DIOVAN-HCT) 320-25 MG tablet TAKE 1 TABLET BY MOUTH DAILY 05/11/21   Venia Carbon, MD  verapamil (CALAN-SR) 180 MG CR tablet TAKE 1 TABLET(180 MG) BY MOUTH  AT BEDTIME 05/11/21   Venia Carbon, MD    Family History Family History  Problem Relation Age of Onset   Cancer Father        prostate    Social History Social History   Tobacco Use   Smoking status: Never   Smokeless tobacco: Never  Vaping Use   Vaping Use: Never used  Substance Use Topics   Alcohol use: Yes    Comment: occasional   Drug use: No     Allergies   Patient has no known allergies.   Review of Systems Review of Systems Pertinent negatives listed in HPI  Physical Exam Triage Vital Signs ED Triage Vitals  Enc Vitals Group     BP 10/18/21 1155 (!) 154/92     Pulse Rate 10/18/21 1155 87     Resp 10/18/21 1155 18     Temp 10/18/21 1155 99.1 F (37.3 C)     Temp Source 10/18/21 1155 Oral     SpO2 10/18/21 1155 97 %     Weight --      Height --      Head Circumference --      Peak Flow --      Pain Score 10/18/21 1154 0     Pain Loc --      Pain Edu? --      Excl. in [place]? --    No data found.  Updated Vital Signs BP (!) 154/92 (BP Location: Left Arm)    Pulse 87    Temp 99.1 F (37.3 C) (Oral)    Resp 18    SpO2 97%   Visual Acuity Right Eye Distance:   Left Eye Distance:   Bilateral Distance:    Right Eye Near:   Left Eye Near:    Bilateral Near:     Physical Exam Constitutional:      Appearance: He is ill-appearing.  HENT:     Head: Normocephalic and atraumatic.     Nose: Congestion present.  Eyes:     Extraocular Movements: Extraocular movements intact.     Pupils: Pupils are equal, round, and reactive to light.  Cardiovascular:     Rate and Rhythm: Normal rate and regular rhythm.  Pulmonary:     Breath sounds: Rhonchi present.  Skin:    Capillary Refill: Capillary refill takes less than 2 seconds.  Neurological:     Mental Status: He is alert.  Psychiatric:        Mood and Affect: Mood normal.        Behavior: Behavior normal.     UC Treatments / Results  Labs (all labs ordered are listed, but only abnormal  results are displayed) Labs Reviewed  COVID-19, FLU A+B  NAA - Abnormal; Notable for the following components:      Result Value   SARS-CoV-2, NAA Detected (*)    All other components within normal limits   Narrative:    Performed at:  [address] [place], [place]  [id number] Lab Director: Rush Farmer MD, Phone:  [phone]  POCT INFLUENZA A/B    EKG   Radiology DG Chest 2 View  Result Date: 10/18/2021 CLINICAL DATA:  Fever, cough, and shortness of breath for the past week. EXAM: CHEST - 2 VIEW COMPARISON:  None. FINDINGS: The heart size and mediastinal contours are within normal limits. Both lungs are clear. The visualized skeletal structures are unremarkable. IMPRESSION: No active cardiopulmonary disease. Electronically Signed   By: Titus Dubin M.D.   On: 10/18/2021 12:31    Procedures Procedures (including critical care time)  Medications Ordered in UC Medications - No data to display  Initial Impression / Assessment and Plan / UC Course  I have reviewed the triage vital signs and the nursing notes.  Pertinent labs & imaging results that were available during my care of the patient were reviewed by me and considered in my medical decision making (see chart for details).   Acute respiratory infection, however cannot rule out an active COVID or Influenza related illness. CXR negative for pneumonia. Treatment today with Doxycyline, Benzonatate and Albuterol inhaler. Rapid influenza test is negative.  COVID/FLU test is pending. ER precautions if symptoms worsen or do not improve. Final Clinical Impressions(s) / UC Diagnoses   Final diagnoses:  Acute lower respiratory infection     Discharge Instructions      Treating you today for a lower respiratory infection.  Start doxycycline twice daily for 10 days.  Tessalon Perles take 2 for cough 3 times daily as needed.  Given your audible wheezing on exam I am placing you on an albuterol inhaler  use every 4-6 hours as needed for wheezing and or shortness of breath or any chest tightness. Chest x-ray is negative for pneumonia.  Given the severity of her symptoms at the course of the last 7 days I am checking a PCR COVID and influenza test to rule out either as a source of your symptoms.  Your test should result within the next 3 days. If any of your symptoms worsen or you develop any chest pain or severe difficulty with breathing is not relieved with albuterol go immediately to the emergency department.     ED Prescriptions     Medication Sig Dispense Auth. Provider   doxycycline (VIBRAMYCIN) 100 MG capsule Take 1 capsule (100 mg total) by mouth 2 (two) times daily. 20 capsule Scot Jun, FNP   benzonatate (TESSALON) 100 MG capsule Take 2 capsules (200 mg total) by mouth 3 (three) times daily as needed for cough. 40 capsule Scot Jun, FNP   albuterol (VENTOLIN HFA) 108 (90 Base) MCG/ACT inhaler Inhale 2 puffs into the lungs every 6 (six) hours as needed for wheezing or shortness of breath. 8 g Scot Jun, FNP      PDMP not reviewed this encounter.   Scot Jun, FNP 10/19/21 [phone]

## 2021-10-18 NOTE — Telephone Encounter (Signed)
Okay  I will await the result of the visit at  [facility] and see him tomorrow

## 2021-10-19 ENCOUNTER — Encounter: Payer: Self-pay | Admitting: Family Medicine

## 2021-10-19 ENCOUNTER — Ambulatory Visit: Payer: BC Managed Care – PPO | Admitting: Internal Medicine

## 2021-10-19 LAB — COVID-19, FLU A+B NAA
Influenza A, NAA: NOT DETECTED
Influenza B, NAA: NOT DETECTED
SARS-CoV-2, NAA: DETECTED — AB

## 2021-10-25 ENCOUNTER — Telehealth: Payer: Self-pay | Admitting: Internal Medicine

## 2021-10-25 NOTE — Telephone Encounter (Signed)
Patient scheduled for Friday with Tabitha to be cleared to go back to work.

## 2021-10-25 NOTE — Telephone Encounter (Signed)
Pt called stating that he went to the Urgent Care on 10/18/21. Pt states that he needs a doctors note to be out of work for the rest of the week. Please advise.

## 2021-10-28 ENCOUNTER — Encounter: Payer: Self-pay | Admitting: Family

## 2021-10-28 ENCOUNTER — Other Ambulatory Visit: Payer: Self-pay

## 2021-10-28 ENCOUNTER — Telehealth (INDEPENDENT_AMBULATORY_CARE_PROVIDER_SITE_OTHER): Payer: BC Managed Care – PPO | Admitting: Family

## 2021-10-28 VITALS — Ht 66.0 in | Wt 206.0 lb

## 2021-10-28 DIAGNOSIS — R051 Acute cough: Secondary | ICD-10-CM

## 2021-10-28 DIAGNOSIS — E1142 Type 2 diabetes mellitus with diabetic polyneuropathy: Secondary | ICD-10-CM

## 2021-10-28 DIAGNOSIS — E1140 Type 2 diabetes mellitus with diabetic neuropathy, unspecified: Secondary | ICD-10-CM | POA: Diagnosis not present

## 2021-10-28 DIAGNOSIS — U071 COVID-19: Secondary | ICD-10-CM | POA: Diagnosis not present

## 2021-10-28 MED ORDER — ONETOUCH ULTRA 2 W/DEVICE KIT: PACK | 0 refills | Status: DC

## 2021-10-28 MED ORDER — PREDNISONE 20 MG PO TABS: ORAL_TABLET | ORAL | 0 refills | Status: DC

## 2021-10-28 NOTE — Progress Notes (Signed)
MyChart Video Visit    Virtual Visit via Video Note   This visit type was conducted due to national recommendations for restrictions regarding the COVID-19 Pandemic (e.g. social distancing) in an effort to limit this patient's exposure and mitigate transmission in our community. This patient is at least at moderate risk for complications without adequate follow up. This format is felt to be most appropriate for this patient at this time. Physical exam was limited by quality of the video and audio technology used for the visit. CMA was able to get the patient set up on a video visit.  Patient location: Home. Patient and provider in visit Provider location: Office  I discussed the limitations of evaluation and management by telemedicine and the availability of in person appointments. The patient expressed understanding and agreed to proceed.  Visit Date: 10/28/2021  Today's healthcare provider: Eugenia Pancoast, FNP     Subjective:    Patient ID: Randall Pace, male    DOB: 12-20-1955, 65 y.o.   MRN: [id number]  Chief Complaint  Patient presents with   Cough    Cough Associated symptoms include shortness of breath (mild with coughing fits). Pertinent negatives include no chest pain, chills, fever (low grade), sore throat or wheezing (intermittently).   65 y/o male here with concerns.   Today at current with c/o ongoing cough with mild sob with coughing attacks. Feels slight chest congestion. Otherwise other sx negative.his concern is that in order to go back to work he needs to be able to talk without coughing, and can not do this currently.  12/7 onset of flu like sx. Saw provider 10/11/21 via video visit, given cough syrup which helped some.   12/15: no improvement, saw pcp and was encouraged to continue cough medication as no s/s suggestive of CAP or bacterial etiology.   12/20:went to urgent care for c/o chest congestion, fever, sob , fatigue and headache. Felt dizzy at  the time. Rapid flu at urgent care was negative. Covid was positive. He was given prescription for doxycycline 100 mg po bid x 10 days, as well as albuterol inhaler and tessalon perrles. CXR was negative for pneumonia.   DM2, has not had proper f/u since 5/22 but does state still taking medication as prescribed. He does not check his glucose at home, because he states his 'kit' is outdated.   Lab Results  Component Value Date   HGBA1C 6.7 (A) 03/23/2021     Past Medical History:  Diagnosis Date   Arthritis    low back   Diabetes mellitus    GERD (gastroesophageal reflux disease)    History of chicken pox    Hypertension    Migraine    Patellofemoral pain syndrome    Sleep apnea     Past Surgical History:  Procedure Laterality Date   NASAL SINUS SURGERY     palatoplasts     TONSILLECTOMY     uvoloplasty      Family History  Problem Relation Age of Onset   Cancer Father        prostate    Social History   Socioeconomic History   Marital status: Married    Spouse name: Not on file   Number of children: 2   Years of education: Not on file   Highest education level: Not on file  Occupational History   Occupation: Scientist, clinical (histocompatibility and immunogenetics): [employer]  Tobacco Use   Smoking status: Never   Smokeless tobacco: Never  Vaping Use   Vaping Use: Never used  Substance and Sexual Activity   Alcohol use: Yes    Comment: occasional   Drug use: No   Sexual activity: Yes  Other Topics Concern   Not on file  Social History Narrative   Not on file   Social Determinants of Health   Financial Resource Strain: Not on file  Food Insecurity: Not on file  Transportation Needs: Not on file  Physical Activity: Not on file  Stress: Not on file  Social Connections: Not on file  Intimate Partner Violence: Not on file    Outpatient Medications Prior to Visit  Medication Sig Dispense Refill   albuterol (VENTOLIN HFA) 108 (90 Base) MCG/ACT inhaler Inhale 2 puffs into the lungs every 6  (six) hours as needed for wheezing or shortness of breath. 8 g 0   aspirin 81 MG tablet Take 81 mg by mouth daily.     atorvastatin (LIPITOR) 40 MG tablet TAKE 1 TABLET(40 MG) BY MOUTH DAILY 90 tablet 3   benzonatate (TESSALON) 100 MG capsule Take 2 capsules (200 mg total) by mouth 3 (three) times daily as needed for cough. 40 capsule 0   cetirizine (ZYRTEC ALLERGY) 10 MG tablet Take 1 tablet (10 mg total) by mouth daily. 30 tablet 6   doxycycline (VIBRAMYCIN) 100 MG capsule Take 1 capsule (100 mg total) by mouth 2 (two) times daily. 20 capsule 0   fenofibrate 54 MG tablet TAKE 1 TABLET(54 MG) BY MOUTH DAILY 90 tablet 3   glipiZIDE (GLUCOTROL) 10 MG tablet TAKE 1 TABLET BY MOUTH TWICE DAILY BEFORE MEALS 180 tablet 3   HYDROcodone bit-homatropine (HYCODAN) 5-1.5 MG/5ML syrup Take 5 mLs by mouth at bedtime as needed for cough. 120 mL 0   Lancets 30G MISC 1 each by Does not apply route 2 (two) times daily. 200 each 1   metFORMIN (GLUCOPHAGE) 1000 MG tablet Take 1 tablet (1,000 mg total) by mouth 2 (two) times daily with a meal. 180 tablet 3   omeprazole (PRILOSEC) 20 MG capsule TAKE 1 CAPSULE(20 MG) BY MOUTH DAILY 90 capsule 3   ONETOUCH ULTRA test strip USE TO TEST TWICE DAILY 200 strip 1   sildenafil (VIAGRA) 100 MG tablet TAKE 1/2 TO 1 TABLET(50 TO 100 MG) BY MOUTH DAILY AS NEEDED FOR ERECTILE DYSFUNCTION 10 tablet 11   triamcinolone ointment (KENALOG) 0.5 % Apply 1 application topically 2 (two) times daily. 30 g 0   valsartan-hydrochlorothiazide (DIOVAN-HCT) 320-25 MG tablet TAKE 1 TABLET BY MOUTH DAILY 90 tablet 3   verapamil (CALAN-SR) 180 MG CR tablet TAKE 1 TABLET(180 MG) BY MOUTH AT BEDTIME 90 tablet 3   Blood Glucose Monitoring Suppl (ONE TOUCH ULTRA 2) w/Device KIT See admin instructions.     No facility-administered medications prior to visit.    No Known Allergies  Review of Systems  Constitutional:  Negative for chills and fever (low grade).  HENT:  Negative for congestion, sinus  pain and sore throat.   Respiratory:  Positive for cough (wet prodcutive), sputum production and shortness of breath (mild with coughing fits). Negative for wheezing (intermittently).   Cardiovascular:  Negative for chest pain, palpitations and leg swelling.      Objective:    Physical Exam Constitutional:      General: He is not in acute distress.    Appearance: Normal appearance. He is obese. He is not ill-appearing, toxic-appearing or diaphoretic.  HENT:     Head: Normocephalic.  Pulmonary:  Effort: Pulmonary effort is normal.  Neurological:     General: No focal deficit present.     Mental Status: He is alert and oriented to person, place, and time.  Psychiatric:        Mood and Affect: Mood normal.        Behavior: Behavior normal.        Thought Content: Thought content normal.    Ht _0  (1.676 m)    Wt 206 lb (93.4 kg)    BMI 33.25 kg/m  Wt Readings from Last 3 Encounters:  10/28/21 206 lb (93.4 kg)  10/13/21 206 lb (93.4 kg)  05/10/21 197 lb (89.4 kg)       Assessment & Plan:   Problem List Items Addressed This Visit       Endocrine   DM type 2 with diabetic peripheral neuropathy ([place])    Sent refill of his blood glucose monitoring kit and advised patient to monitor strictly over the weekend check fasting glucose every single morning and keep log.  If over 200 please do not continue with the steroid pack.  Monitor for signs and symptoms of polydipsia polyuria and polyphagia.  Discussed hypoglycemic symptoms.  Patient to continue with diabetic medication as prescribed and to have strict follow-up in the next 5 days within our office so that we can obtain blood work and have staff assess him physically.,         Other   Acute cough    Steroid pack prescribed, patient okay to continue with over-the-counter cough medication.      Relevant Medications   predniSONE (DELTASONE) 20 MG tablet   COVID-19    Medrol Dosepak prescribed to patient, at this time I do  not feel that an additional antibiotic is necessary however we will have strict follow-up with patient in the next 5 days within the office to assess him more appropriately.    Advised of CDC guidelines for self isolation/ ending isolation.  Advised of safe practice guidelines. Symptom Tier reviewed.  Encouraged to monitor for any worsening symptoms; watch for increased shortness of breath, weakness, and signs of dehydration. Advised when to seek emergency care.  Instructed to rest and hydrate well.  Advised to leave the house during recommended isolation period, only if it is necessary to seek medical care       Other Visit Diagnoses     Type 2 diabetes mellitus with diabetic neuropathy, without long-term current use of insulin ([place])    -  Primary   Relevant Medications   Blood Glucose Monitoring Suppl (ONE TOUCH ULTRA 2) w/Device KIT       I have changed Randall Pace's ONE TOUCH ULTRA 2. I am also having him start on predniSONE. Additionally, I am having him maintain his aspirin, triamcinolone ointment, cetirizine, Lancets 30G, OneTouch Ultra, metFORMIN, glipiZIDE, fenofibrate, valsartan-hydrochlorothiazide, verapamil, atorvastatin, omeprazole, sildenafil, HYDROcodone bit-homatropine, doxycycline, benzonatate, and albuterol.  Meds ordered this encounter  Medications   Blood Glucose Monitoring Suppl (ONE TOUCH ULTRA 2) w/Device KIT    Sig: Check blood glucose qam    Dispense:  1 kit    Refill:  0    Order Specific Question:   Supervising Provider    Answer:   BEDSOLE, AMY E [[id number]]   predniSONE (DELTASONE) 20 MG tablet    Sig: Take two tablets (total of 40 mg) on day 1 and day 2, then take one tablet (20 mg) po qd for the next three days. Strict glucose monitoring  while taking.    Dispense:  7 tablet    Refill:  0    Order Specific Question:   Supervising Provider    Answer:   BEDSOLE, AMY E [[id number]]    I discussed the assessment and treatment plan with the patient. The patient was  provided an opportunity to ask questions and all were answered. The patient agreed with the plan and demonstrated an understanding of the instructions.   The patient was advised to call back or seek an in-person evaluation if the symptoms worsen or if the condition fails to improve as anticipated.  I provided 32 minutes of face-to-face time during this encounter.   Eugenia Pancoast, [place] [facility] [phone] (phone) [phone] (fax)  [place]

## 2021-10-28 NOTE — Assessment & Plan Note (Signed)
Steroid pack prescribed, patient okay to continue with over-the-counter cough medication.

## 2021-10-28 NOTE — Assessment & Plan Note (Signed)
Sent refill of his blood glucose monitoring kit and advised patient to monitor strictly over the weekend check fasting glucose every single morning and keep log.  If over 200 please do not continue with the steroid pack.  Monitor for signs and symptoms of polydipsia polyuria and polyphagia.  Discussed hypoglycemic symptoms.  Patient to continue with diabetic medication as prescribed and to have strict follow-up in the next 5 days within our office so that we can obtain blood work and have staff assess him physically.,

## 2021-10-28 NOTE — Assessment & Plan Note (Signed)
Medrol Dosepak prescribed to patient, at this time I do not feel that an additional antibiotic is necessary however we will have strict follow-up with patient in the next 5 days within the office to assess him more appropriately.    Advised of CDC guidelines for self isolation/ ending isolation.  Advised of safe practice guidelines. Symptom Tier reviewed.  Encouraged to monitor for any worsening symptoms; watch for increased shortness of breath, weakness, and signs of dehydration. Advised when to seek emergency care.  Instructed to rest and hydrate well.  Advised to leave the house during recommended isolation period, only if it is necessary to seek medical care

## 2021-10-28 NOTE — Patient Instructions (Signed)
Your COVID19 PCR test is POSITIVE. ° °Let us know right away if any worsening shortness of breath or persistent productive cough or fever.  ° °Follow these current CDC guidelines for self-isolation: ° °- Stay home for 5 days, starting the day after your symptoms (The first day is really day 0). °- If you have no symptoms or your symptoms are resolving after 5 days, you can leave your house. °- Continue to wear a mask around others for 5 additional days. °**If you have a fever, continue to stay home until your fever resolves for 24 hours without fever-reducing medications.** ° °

## 2021-11-01 ENCOUNTER — Ambulatory Visit (INDEPENDENT_AMBULATORY_CARE_PROVIDER_SITE_OTHER): Payer: BC Managed Care – PPO | Admitting: Family

## 2021-11-01 ENCOUNTER — Other Ambulatory Visit: Payer: Self-pay

## 2021-11-01 ENCOUNTER — Encounter: Payer: Self-pay | Admitting: Family

## 2021-11-01 VITALS — BP 161/101 | HR 100 | Temp 97.6°F | Ht 66.0 in | Wt 200.0 lb

## 2021-11-01 DIAGNOSIS — E1140 Type 2 diabetes mellitus with diabetic neuropathy, unspecified: Secondary | ICD-10-CM | POA: Diagnosis not present

## 2021-11-01 DIAGNOSIS — U071 COVID-19: Secondary | ICD-10-CM | POA: Diagnosis not present

## 2021-11-01 DIAGNOSIS — R051 Acute cough: Secondary | ICD-10-CM

## 2021-11-01 DIAGNOSIS — Z91199 Patient's noncompliance with other medical treatment and regimen due to unspecified reason: Secondary | ICD-10-CM

## 2021-11-01 DIAGNOSIS — E1169 Type 2 diabetes mellitus with other specified complication: Secondary | ICD-10-CM

## 2021-11-01 DIAGNOSIS — E785 Hyperlipidemia, unspecified: Secondary | ICD-10-CM

## 2021-11-01 DIAGNOSIS — I10 Essential (primary) hypertension: Secondary | ICD-10-CM

## 2021-11-01 LAB — CBC WITH DIFFERENTIAL/PLATELET
Basophils Absolute: 0 10*3/uL (ref 0.0–0.1)
Basophils Relative: 0.4 % (ref 0.0–3.0)
Eosinophils Absolute: 0 10*3/uL (ref 0.0–0.7)
Eosinophils Relative: 0.5 % (ref 0.0–5.0)
HCT: 37.9 % — ABNORMAL LOW (ref 39.0–52.0)
Hemoglobin: 12.4 g/dL — ABNORMAL LOW (ref 13.0–17.0)
Lymphocytes Relative: 23.7 % (ref 12.0–46.0)
Lymphs Abs: 2.4 10*3/uL (ref 0.7–4.0)
MCHC: 32.8 g/dL (ref 30.0–36.0)
MCV: 87.7 fl (ref 78.0–100.0)
Monocytes Absolute: 0.6 10*3/uL (ref 0.1–1.0)
Monocytes Relative: 6.2 % (ref 3.0–12.0)
Neutro Abs: 7.1 10*3/uL (ref 1.4–7.7)
Neutrophils Relative %: 69.2 % (ref 43.0–77.0)
Platelets: 327 10*3/uL (ref 150.0–400.0)
RBC: 4.33 Mil/uL (ref 4.22–5.81)
RDW: 13.4 % (ref 11.5–15.5)
WBC: 10.3 10*3/uL (ref 4.0–10.5)

## 2021-11-01 LAB — LIPID PANEL
Cholesterol: 154 mg/dL (ref 0–200)
HDL: 38.7 mg/dL — ABNORMAL LOW (ref >39.00)
NonHDL: 114.91
Total CHOL/HDL Ratio: 4
Triglycerides: 241 mg/dL — ABNORMAL HIGH (ref 0.0–149.0)
VLDL: 48.2 mg/dL — ABNORMAL HIGH (ref 0.0–40.0)

## 2021-11-01 LAB — COMPREHENSIVE METABOLIC PANEL
ALT: 19 U/L (ref 0–53)
AST: 15 U/L (ref 0–37)
Albumin: 4.3 g/dL (ref 3.5–5.2)
Alkaline Phosphatase: 69 U/L (ref 39–117)
BUN: 30 mg/dL — ABNORMAL HIGH (ref 6–23)
CO2: 28 mEq/L (ref 19–32)
Calcium: 10.7 mg/dL — ABNORMAL HIGH (ref 8.4–10.5)
Chloride: 95 mEq/L — ABNORMAL LOW (ref 96–112)
Creatinine, Ser: 1.13 mg/dL (ref 0.40–1.50)
GFR: 68.54 mL/min (ref >60.00)
Glucose, Bld: 260 mg/dL — ABNORMAL HIGH (ref 70–99)
Potassium: 4.4 mEq/L (ref 3.5–5.1)
Sodium: 134 mEq/L — ABNORMAL LOW (ref 135–145)
Total Bilirubin: 0.4 mg/dL (ref 0.2–1.2)
Total Protein: 6.9 g/dL (ref 6.0–8.3)

## 2021-11-01 LAB — POCT GLYCOSYLATED HEMOGLOBIN (HGB A1C): Hemoglobin A1C: 10.8 % — AB (ref 4.0–5.6)

## 2021-11-01 LAB — MICROALBUMIN / CREATININE URINE RATIO
Creatinine,U: 88.1 mg/dL
Microalb Creat Ratio: 24.2 mg/g (ref 0.0–30.0)
Microalb, Ur: 21.3 mg/dL — ABNORMAL HIGH (ref 0.0–1.9)

## 2021-11-01 LAB — LDL CHOLESTEROL, DIRECT: Direct LDL: 78 mg/dL

## 2021-11-01 MED ORDER — MONTELUKAST SODIUM 10 MG PO TABS: 10.0000 mg | ORAL_TABLET | Freq: Every day | ORAL | 1 refills | Status: DC

## 2021-11-01 NOTE — Assessment & Plan Note (Signed)
Will start singulair 10 mg po qd, see if this helps. Work note for the next four days, f/u on Friday to re-eval. Also suggested starting daily antihistamine.

## 2021-11-01 NOTE — Patient Instructions (Addendum)
Stop by the lab prior to leaving today. I will notify you of your results once received.   I sent a new medication, singulair, to start to see if any improvement of your cough.   We may consider another addition of a medication for uncontrolled diabetes.  Go home and take your blood pressure medication, Start monitoring your blood pressure daily, around the same time of day, for the next 2 weeks. Ensure that you have rested for 30 minutes prior to checking your blood pressure. Record your readings and bring them to your next visit. If >140/90 in the next 2-3 days please let me know as we may need to increase your medication.

## 2021-11-01 NOTE — Assessment & Plan Note (Signed)
Today uncontrolled however patient states he is not taking his blood pressure medication.  Advised patient the importance of taking this on a timely manner and as scheduled.  Patient to go home and take medication and monitor blood pressure.  Patient to monitor blood pressure the next 2 days and follow-up this Friday with blood pressure log.  If any sudden onset chest pain and/or shortness of breath patient to immediately go to the emergency room.

## 2021-11-01 NOTE — Progress Notes (Signed)
Established Patient Office Visit  Subjective:  Patient ID: Randall Pace, male    DOB: 26-Apr-1956  Age: 66 y.o. MRN: [id number]  CC:  Chief Complaint  Patient presents with   Follow-up    HPI [name] is here today for f/u  Seen 12/30 for video visit.  DM2: overdue for lab work, glucose monitoring kit sent to pharmacy 12/30. Glipizide 10 mg BID, metformin 1000 mg one po bid. He does state when he found out his A1C was controlled in May, he 'celebrated' with poor food choices and 'never stopped celebrating so expects it not to be as controlled.  He does admit that he missed his evening doses of medication and about 2-3 times weekly.  He also states he picked up his kit for glucose monitoring but admits to not checking it at all this weekend.  Denies polyuria polyphagia and or polydipsia  Lab Results  Component Value Date   HGBA1C 10.8 (A) 11/01/2021   HTN: takes verapamil at night, and valsartan HCTZ this am. He did not yet take his blood pressure this am. Today is 168/102. Denies blurry vision and or headache.  COVID 19: medrol dose pack sent over the weekend, pt taking and tolerating well. Slight improvement in cough, but still with coughing episodes especially triggered by talking. The issue is he needs to be able to talk for work without coughing.   H/o present illness/covid:  12/7 onset of flu like sx. Saw provider 10/11/21 via video visit, given cough syrup which helped some.    12/15: no improvement, saw pcp and was encouraged to continue cough medication as no s/s suggestive of CAP or bacterial etiology.    12/20:went to urgent care for c/o chest congestion, fever, sob , fatigue and headache. Felt dizzy at the time. Rapid flu at urgent care was negative. Covid was positive. He was given prescription for doxycycline 100 mg po bid x 10 days, as well as albuterol inhaler and tessalon perrles. CXR was negative for pneumonia.   Past Medical History:  Diagnosis Date    Arthritis    low back   Diabetes mellitus    GERD (gastroesophageal reflux disease)    History of chicken pox    Hypertension    Migraine    Patellofemoral pain syndrome    Sleep apnea     Past Surgical History:  Procedure Laterality Date   NASAL SINUS SURGERY     palatoplasts     TONSILLECTOMY     uvoloplasty      Family History  Problem Relation Age of Onset   Cancer Father        prostate    Social History   Socioeconomic History   Marital status: Married    Spouse name: Not on file   Number of children: 2   Years of education: Not on file   Highest education level: Not on file  Occupational History   Occupation: Investment banker, corporate: [employer]  Tobacco Use   Smoking status: Never   Smokeless tobacco: Never  Vaping Use   Vaping Use: Never used  Substance and Sexual Activity   Alcohol use: Yes    Comment: occasional   Drug use: No   Sexual activity: Yes  Other Topics Concern   Not on file  Social History Narrative   Not on file   Social Determinants of Health   Financial Resource Strain: Not on file  Food Insecurity: Not on file  Transportation Needs: Not  on file  Physical Activity: Not on file  Stress: Not on file  Social Connections: Not on file  Intimate Partner Violence: Not on file    Outpatient Medications Prior to Visit  Medication Sig Dispense Refill   aspirin 81 MG tablet Take 81 mg by mouth daily.     atorvastatin (LIPITOR) 40 MG tablet TAKE 1 TABLET(40 MG) BY MOUTH DAILY 90 tablet 3   Blood Glucose Monitoring Suppl (ONE TOUCH ULTRA 2) w/Device KIT Check blood glucose qam 1 kit 0   cetirizine (ZYRTEC ALLERGY) 10 MG tablet Take 1 tablet (10 mg total) by mouth daily. 30 tablet 6   fenofibrate 54 MG tablet TAKE 1 TABLET(54 MG) BY MOUTH DAILY 90 tablet 3   glipiZIDE (GLUCOTROL) 10 MG tablet TAKE 1 TABLET BY MOUTH TWICE DAILY BEFORE MEALS 180 tablet 3   Lancets 30G MISC 1 each by Does not apply Pace 2 (two) times daily. 200 each 1   metFORMIN  (GLUCOPHAGE) 1000 MG tablet Take 1 tablet (1,000 mg total) by mouth 2 (two) times daily with a meal. 180 tablet 3   omeprazole (PRILOSEC) 20 MG capsule TAKE 1 CAPSULE(20 MG) BY MOUTH DAILY 90 capsule 3   ONETOUCH ULTRA test strip USE TO TEST TWICE DAILY 200 strip 1   sildenafil (VIAGRA) 100 MG tablet TAKE 1/2 TO 1 TABLET(50 TO 100 MG) BY MOUTH DAILY AS NEEDED FOR ERECTILE DYSFUNCTION 10 tablet 11   triamcinolone ointment (KENALOG) 0.5 % Apply 1 application topically 2 (two) times daily. 30 g 0   valsartan-hydrochlorothiazide (DIOVAN-HCT) 320-25 MG tablet TAKE 1 TABLET BY MOUTH DAILY 90 tablet 3   verapamil (CALAN-SR) 180 MG CR tablet TAKE 1 TABLET(180 MG) BY MOUTH AT BEDTIME 90 tablet 3   doxycycline (VIBRAMYCIN) 100 MG capsule Take 1 capsule (100 mg total) by mouth 2 (two) times daily. 20 capsule 0   albuterol (VENTOLIN HFA) 108 (90 Base) MCG/ACT inhaler Inhale 2 puffs into the lungs every 6 (six) hours as needed for wheezing or shortness of breath. (Patient not taking: Reported on 11/01/2021) 8 g 0   benzonatate (TESSALON) 100 MG capsule Take 2 capsules (200 mg total) by mouth 3 (three) times daily as needed for cough. (Patient not taking: Reported on 11/01/2021) 40 capsule 0   HYDROcodone bit-homatropine (HYCODAN) 5-1.5 MG/5ML syrup Take 5 mLs by mouth at bedtime as needed for cough. (Patient not taking: Reported on 11/01/2021) 120 mL 0   predniSONE (DELTASONE) 20 MG tablet Take two tablets (total of 40 mg) on day 1 and day 2, then take one tablet (20 mg) po qd for the next three days. Strict glucose monitoring while taking. (Patient not taking: Reported on 11/01/2021) 7 tablet 0   No facility-administered medications prior to visit.    No Known Allergies  ROS Review of Systems  Constitutional:  Negative for chills and fever.  HENT:  Negative for congestion, ear pain, sinus pain and sore throat.   Respiratory:  Positive for cough (constant tickle in throat). Negative for shortness of breath and  wheezing.   Cardiovascular:  Negative for chest pain and palpitations.  All other systems reviewed and are negative.    Objective:    Physical Exam Vitals reviewed.  Constitutional:      General: He is not in acute distress.    Appearance: Normal appearance. He is obese. He is not ill-appearing, toxic-appearing or diaphoretic.  HENT:     Head: Normocephalic.     Right Ear: Tympanic membrane normal.  Left Ear: Tympanic membrane normal.     Ears:     Comments: Ear canal with mild erythema    Nose: Nose normal.     Mouth/Throat:     Mouth: Mucous membranes are moist.  Eyes:     Pupils: Pupils are equal, round, and reactive to light.  Cardiovascular:     Rate and Rhythm: Normal rate and regular rhythm.  Pulmonary:     Effort: Pulmonary effort is normal.     Breath sounds: Normal breath sounds.  Skin:    General: Skin is warm.  Neurological:     Mental Status: He is alert.    BP (!) 168/102    Pulse 100    Temp 97.6 F (36.4 C) (Temporal)    Ht $R'5\' 6"'EI$  (1.676 m)    Wt 200 lb (90.7 kg)    SpO2 96%    BMI 32.28 kg/m  Wt Readings from Last 3 Encounters:  11/01/21 200 lb (90.7 kg)  10/28/21 206 lb (93.4 kg)  10/13/21 206 lb (93.4 kg)     Health Maintenance Due  Topic Date Due   Pneumococcal Vaccine 84-22 Years old (1 - PCV) Never done   HIV Screening  Never done   Hepatitis C Screening  Never done   Zoster Vaccines- Shingrix (1 of 2) Never done   COVID-19 Vaccine (3 - Booster) 03/22/2020   OPHTHALMOLOGY EXAM  07/07/2020    There are no preventive care reminders to display for this patient.  Lab Results  Component Value Date   TSH 0.64 12/14/2008   Lab Results  Component Value Date   WBC 6.6 03/31/2021   HGB 12.5 (L) 03/31/2021   HCT 37.2 (L) 03/31/2021   MCV 86.6 03/31/2021   PLT 261.0 03/31/2021   Lab Results  Component Value Date   NA 141 03/23/2021   K 4.2 03/23/2021   CO2 27 03/23/2021   GLUCOSE 174 (H) 03/23/2021   BUN 18 03/23/2021   CREATININE  1.12 03/23/2021   BILITOT 0.3 03/23/2021   ALKPHOS 60 03/23/2021   AST 15 03/23/2021   ALT 18 03/23/2021   PROT 6.8 03/23/2021   ALBUMIN 4.3 03/23/2021   ALBUMIN 4.3 03/23/2021   CALCIUM 10.0 03/23/2021   GFR 69.57 03/23/2021   Lab Results  Component Value Date   HGBA1C 10.8 (A) 11/01/2021      Assessment & Plan:   Problem List Items Addressed This Visit       Cardiovascular and Mediastinum   Essential hypertension    Today uncontrolled however patient states he is not taking his blood pressure medication.  Advised patient the importance of taking this on a timely manner and as scheduled.  Patient to go home and take medication and monitor blood pressure.  Patient to monitor blood pressure the next 2 days and follow-up this Friday with blood pressure log.  If any sudden onset chest pain and/or shortness of breath patient to immediately go to the emergency room.        Endocrine   Hyperlipidemia associated with type 2 diabetes mellitus ([place])    Lipid panel ordered today pending results, patient to work on low-cholesterol diet and exercise as tolerated      Relevant Orders   Lipid Profile   Type 2 diabetes mellitus with diabetic neuropathy, without long-term current use of insulin ([place]) - Primary    hga1c today ordered, reviewed. Uncontrolled diabetes. Stressed importance of taking night time meds, just as important as day time  meds.  Discussed diabetic diet as well as exercising as tolerated.  Will review again at next visit follow-up.      Relevant Orders   POCT glycosylated hemoglobin (Hb A1C) (Completed)   Comprehensive metabolic panel   Microalbumin / creatinine urine ratio     Other   Acute cough    Will start singulair 10 mg po qd, see if this helps. Work note for the next four days, f/u on Friday to re-eval. Also suggested starting daily antihistamine.       Relevant Medications   montelukast (SINGULAIR) 10 MG tablet   Other Relevant Orders   CBC w/Diff    COVID-19    Continue last day of steroid pack as prescribed.       Medically noncompliant    Long d/w pt on importance of compliance and not missing medications as with comorbidities can experience long term effects of not controlling his dx.       Meds ordered this encounter  Medications   montelukast (SINGULAIR) 10 MG tablet    Sig: Take 1 tablet (10 mg total) by mouth at bedtime.    Dispense:  30 tablet    Refill:  1    Order Specific Question:   Supervising Provider    Answer:   Diona Browner, AMY E [[id number]]    Follow-up: Return in about 3 days (around 11/04/2021) for with blood pressure log .    Eugenia Pancoast, FNP

## 2021-11-01 NOTE — Assessment & Plan Note (Signed)
Long d/w pt on importance of compliance and not missing medications as with comorbidities can experience long term effects of not controlling his dx.

## 2021-11-01 NOTE — Assessment & Plan Note (Signed)
Lipid panel ordered today pending results, patient to work on low-cholesterol diet and exercise as tolerated

## 2021-11-01 NOTE — Assessment & Plan Note (Signed)
Continue last day of steroid pack as prescribed.

## 2021-11-01 NOTE — Assessment & Plan Note (Signed)
hga1c today ordered, reviewed. Uncontrolled diabetes. Stressed importance of taking night time meds, just as important as day time meds.  Discussed diabetic diet as well as exercising as tolerated.  Will review again at next visit follow-up.

## 2021-11-02 ENCOUNTER — Other Ambulatory Visit (INDEPENDENT_AMBULATORY_CARE_PROVIDER_SITE_OTHER): Payer: BC Managed Care – PPO

## 2021-11-02 ENCOUNTER — Other Ambulatory Visit: Payer: Self-pay | Admitting: Family

## 2021-11-02 LAB — VITAMIN D 25 HYDROXY (VIT D DEFICIENCY, FRACTURES): VITD: 21.41 ng/mL — ABNORMAL LOW (ref 30.00–100.00)

## 2021-11-02 NOTE — Progress Notes (Signed)
Changed order to "FUTURE"

## 2021-11-03 MED ORDER — VITAMIN D (ERGOCALCIFEROL) 1.25 MG (50000 UNIT) PO CAPS: 50000.0000 [IU] | ORAL_CAPSULE | ORAL | 0 refills | Status: AC

## 2021-11-04 ENCOUNTER — Telehealth: Payer: Self-pay | Admitting: Internal Medicine

## 2021-11-04 ENCOUNTER — Other Ambulatory Visit: Payer: Self-pay | Admitting: Family

## 2021-11-04 ENCOUNTER — Encounter: Payer: Self-pay | Admitting: Family

## 2021-11-04 ENCOUNTER — Ambulatory Visit: Payer: BC Managed Care – PPO | Admitting: Family

## 2021-11-04 DIAGNOSIS — I10 Essential (primary) hypertension: Secondary | ICD-10-CM

## 2021-11-04 DIAGNOSIS — G4733 Obstructive sleep apnea (adult) (pediatric): Secondary | ICD-10-CM

## 2021-11-04 MED ORDER — VERAPAMIL HCL ER 240 MG PO TBCR: 240.0000 mg | EXTENDED_RELEASE_TABLET | Freq: Every day | ORAL | 0 refills | Status: DC

## 2021-11-04 NOTE — Telephone Encounter (Signed)
Pt came in office to reschedule  OV and left paperwork with BP  reading . It was place in mail box

## 2021-11-07 NOTE — Telephone Encounter (Signed)
Blood pressures were placed in scanning to add to the patients chart.

## 2021-11-09 ENCOUNTER — Encounter: Payer: Self-pay | Admitting: Family

## 2021-11-09 ENCOUNTER — Other Ambulatory Visit: Payer: Self-pay

## 2021-11-09 ENCOUNTER — Other Ambulatory Visit: Payer: Self-pay | Admitting: Family Medicine

## 2021-11-09 ENCOUNTER — Ambulatory Visit (INDEPENDENT_AMBULATORY_CARE_PROVIDER_SITE_OTHER): Payer: BC Managed Care – PPO | Admitting: Family

## 2021-11-09 VITALS — BP 162/94 | HR 88 | Temp 97.0°F | Ht 66.0 in | Wt 203.0 lb

## 2021-11-09 DIAGNOSIS — J3089 Other allergic rhinitis: Secondary | ICD-10-CM | POA: Diagnosis not present

## 2021-11-09 DIAGNOSIS — E1140 Type 2 diabetes mellitus with diabetic neuropathy, unspecified: Secondary | ICD-10-CM

## 2021-11-09 DIAGNOSIS — E1169 Type 2 diabetes mellitus with other specified complication: Secondary | ICD-10-CM | POA: Diagnosis not present

## 2021-11-09 DIAGNOSIS — I10 Essential (primary) hypertension: Secondary | ICD-10-CM | POA: Diagnosis not present

## 2021-11-09 DIAGNOSIS — N521 Erectile dysfunction due to diseases classified elsewhere: Secondary | ICD-10-CM

## 2021-11-09 DIAGNOSIS — Z91199 Patient's noncompliance with other medical treatment and regimen due to unspecified reason: Secondary | ICD-10-CM

## 2021-11-09 DIAGNOSIS — E785 Hyperlipidemia, unspecified: Secondary | ICD-10-CM

## 2021-11-09 DIAGNOSIS — R051 Acute cough: Secondary | ICD-10-CM

## 2021-11-09 DIAGNOSIS — U071 COVID-19: Secondary | ICD-10-CM

## 2021-11-09 MED ORDER — FLUTICASONE PROPIONATE 50 MCG/ACT NA SUSP: 2.0000 | Freq: Every day | NASAL | 6 refills | Status: DC

## 2021-11-09 NOTE — Assessment & Plan Note (Signed)
Cough seems to be resolving however advised patient to continue with Singulair as this has been helping him with his symptoms as well as nightly Zyrtec.  Considering he still has a dry nonproductive cough at times he can also add Flonase once daily and see if this helps however I advised him he wanted take this at least for 5 days for really start working in the system.

## 2021-11-09 NOTE — Assessment & Plan Note (Addendum)
Symptoms are slowly starting to improve and cough is slowly resolving.  To note, patient was supposed to follow-up on November 04, 2021 but he was late for his appointment and he was marked as a no-show.  Patient was informed that he needed to come to this appointment to determine if he was okay to go back to work.  At this point in time he is good to go back to work and I have noted as such however he would have probably been likely able to go back to work on the ninth if he did not show up for his appointment.  Also discussed this with the patient.

## 2021-11-09 NOTE — Progress Notes (Signed)
Established Patient Office Visit  Subjective:  Patient ID: Randall Pace, male    DOB: May 22, 1956  Age: 66 y.o. MRN: [id number]  CC:  Chief Complaint  Patient presents with   Follow-up    HPI Randall Pace is here today for f/u.   He still has a dry nonproductive cough that is slowly improving. SOB with coughing fits also seems to be improving. Denies sore throat, ear pain, fever and or chills. Is taking zyrtec nightly. Did start singulair 10 mg nightly which has also seemed to help. He has noticed improved nasal congestion and breathing better since starting.   DM2: last appt did admit misses night time dose of glipizide however since last visit has improved, and is taking twice daily as directed. This has been since 12/30 so for the past twelve days. However he states he is not checking his blood sugar regularly.   HTN: did not increase his verapamil as directed, however did pick up increased dose for 240 mg once daily from 180 mg. He also has not yet taken his blood pressure medication this am. He denies chest pain or palpitations, however does have a dull headache. No edema on bil LE.    12/7 onset of flu symptoms 12/15 saw pcp, continued with cough medocation  12/20 went to urgent care, negative CXR. Given doxycycline 100 mg po qd x 10 days 12/30 saw me in office for ongoing cough, mild sob with coughing fits. No chest pain fever, chills or sore throat. Was wheezing intermittently. Was given rx for medrol dose pack. Was instructed to check blood glucose daily and sent refill for blood glucose meter however pt still did not check glucose at home.   Past Medical History:  Diagnosis Date   Arthritis    low back   Diabetes mellitus    GERD (gastroesophageal reflux disease)    History of chicken pox    Hypertension    Migraine    Patellofemoral pain syndrome    Sleep apnea     Past Surgical History:  Procedure Laterality Date   NASAL SINUS SURGERY     palatoplasts      TONSILLECTOMY     uvoloplasty      Family History  Problem Relation Age of Onset   Cancer Father        prostate    Social History   Socioeconomic History   Marital status: Married    Spouse name: Not on file   Number of children: 2   Years of education: Not on file   Highest education level: Not on file  Occupational History   Occupation: Scientist, clinical (histocompatibility and immunogenetics): [employer]  Tobacco Use   Smoking status: Never   Smokeless tobacco: Never  Vaping Use   Vaping Use: Never used  Substance and Sexual Activity   Alcohol use: Yes    Comment: occasional   Drug use: No   Sexual activity: Yes  Other Topics Concern   Not on file  Social History Narrative   Not on file   Social Determinants of Health   Financial Resource Strain: Not on file  Food Insecurity: Not on file  Transportation Needs: Not on file  Physical Activity: Not on file  Stress: Not on file  Social Connections: Not on file  Intimate Partner Violence: Not on file    Outpatient Medications Prior to Visit  Medication Sig Dispense Refill   aspirin 81 MG tablet Take 81 mg by mouth daily.  atorvastatin (LIPITOR) 40 MG tablet TAKE 1 TABLET(40 MG) BY MOUTH DAILY 90 tablet 3   Blood Glucose Monitoring Suppl (ONE TOUCH ULTRA 2) w/Device KIT Check blood glucose qam 1 kit 0   cetirizine (ZYRTEC ALLERGY) 10 MG tablet Take 1 tablet (10 mg total) by mouth daily. 30 tablet 6   fenofibrate 54 MG tablet TAKE 1 TABLET(54 MG) BY MOUTH DAILY 90 tablet 3   glipiZIDE (GLUCOTROL) 10 MG tablet TAKE 1 TABLET BY MOUTH TWICE DAILY BEFORE MEALS 180 tablet 3   Lancets 30G MISC 1 each by Does not apply route 2 (two) times daily. 200 each 1   metFORMIN (GLUCOPHAGE) 1000 MG tablet Take 1 tablet (1,000 mg total) by mouth 2 (two) times daily with a meal. 180 tablet 3   montelukast (SINGULAIR) 10 MG tablet Take 1 tablet (10 mg total) by mouth at bedtime. 30 tablet 1   omeprazole (PRILOSEC) 20 MG capsule TAKE 1 CAPSULE(20 MG) BY MOUTH DAILY 90  capsule 3   ONETOUCH ULTRA test strip USE TO TEST TWICE DAILY 200 strip 1   sildenafil (VIAGRA) 100 MG tablet TAKE 1/2 TO 1 TABLET(50 TO 100 MG) BY MOUTH DAILY AS NEEDED FOR ERECTILE DYSFUNCTION 10 tablet 11   triamcinolone ointment (KENALOG) 0.5 % Apply 1 application topically 2 (two) times daily. 30 g 0   valsartan-hydrochlorothiazide (DIOVAN-HCT) 320-25 MG tablet TAKE 1 TABLET BY MOUTH DAILY 90 tablet 3   verapamil (CALAN-SR) 240 MG CR tablet Take 1 tablet (240 mg total) by mouth at bedtime. 30 tablet 0   Vitamin D, Ergocalciferol, (DRISDOL) 1.25 MG (50000 UNIT) CAPS capsule Take 1 capsule (50,000 Units total) by mouth every 7 (seven) days for 8 doses. 8 capsule 0   albuterol (VENTOLIN HFA) 108 (90 Base) MCG/ACT inhaler Inhale 2 puffs into the lungs every 6 (six) hours as needed for wheezing or shortness of breath. (Patient not taking: Reported on 11/01/2021) 8 g 0   No facility-administered medications prior to visit.    No Known Allergies  ROS Review of Systems  Constitutional:  Negative for chills and fever.  HENT:  Negative for congestion, ear pain, sinus pain and sore throat.   Respiratory:  Positive for cough (dry non productive cough improving). Negative for shortness of breath and wheezing.   Cardiovascular:  Negative for chest pain and palpitations.  Neurological:  Positive for headaches (dull headache intermittently temporal).     Objective:    Physical Exam Constitutional:      General: He is awake. He is not in acute distress.    Appearance: Normal appearance. He is not ill-appearing.  HENT:     Right Ear: Tympanic membrane normal.     Left Ear: Tympanic membrane normal.     Ears:     Comments: Mild erythema bil ear canals No edema no discharge nontender    Nose: Nose normal.     Right Turbinates: Not enlarged or swollen.     Left Turbinates: Not enlarged or swollen.     Right Sinus: No maxillary sinus tenderness or frontal sinus tenderness.     Left Sinus: No  maxillary sinus tenderness or frontal sinus tenderness.     Mouth/Throat:     Mouth: Mucous membranes are moist.     Pharynx: No pharyngeal swelling, oropharyngeal exudate or posterior oropharyngeal erythema.  Eyes:     Extraocular Movements: Extraocular movements intact.     Pupils: Pupils are equal, round, and reactive to light.  Cardiovascular:  Rate and Rhythm: Normal rate and regular rhythm.  Pulmonary:     Effort: Pulmonary effort is normal.     Breath sounds: Normal breath sounds. No wheezing.  Neurological:     Mental Status: He is alert.    BP (!) 162/94    Pulse 88    Temp (!) 97 F (36.1 C) (Temporal)    Ht 5\' 6"  (1.676 m)    Wt 203 lb (92.1 kg)    SpO2 97%    BMI 32.77 kg/m  Wt Readings from Last 3 Encounters:  11/09/21 203 lb (92.1 kg)  11/01/21 200 lb (90.7 kg)  10/28/21 206 lb (93.4 kg)     Health Maintenance Due  Topic Date Due   Pneumococcal Vaccine 60-65 Years old (1 - PCV) Never done   HIV Screening  Never done   Hepatitis C Screening  Never done   Zoster Vaccines- Shingrix (1 of 2) Never done   COVID-19 Vaccine (3 - Booster) 03/22/2020   OPHTHALMOLOGY EXAM  07/07/2020    There are no preventive care reminders to display for this patient.  Lab Results  Component Value Date   TSH 0.64 12/14/2008   Lab Results  Component Value Date   WBC 10.3 11/01/2021   HGB 12.4 (L) 11/01/2021   HCT 37.9 (L) 11/01/2021   MCV 87.7 11/01/2021   PLT 327.0 11/01/2021   Lab Results  Component Value Date   NA 134 (L) 11/01/2021   K 4.4 11/01/2021   CO2 28 11/01/2021   GLUCOSE 260 (H) 11/01/2021   BUN 30 (H) 11/01/2021   CREATININE 1.13 11/01/2021   BILITOT 0.4 11/01/2021   ALKPHOS 69 11/01/2021   AST 15 11/01/2021   ALT 19 11/01/2021   PROT 6.9 11/01/2021   ALBUMIN 4.3 11/01/2021   CALCIUM 10.7 (H) 11/01/2021   GFR 68.54 11/01/2021   Lab Results  Component Value Date   HGBA1C 10.8 (A) 11/01/2021      Assessment & Plan:   Problem List Items  Addressed This Visit       Cardiovascular and Mediastinum   Essential hypertension    Patient is noncompliant and has not increased his verapamil dosing to 240 mg from 180 mg however he has picked it up from the pharmacy. when speaking with him it is unclear if he is taking his medications as directed so I have discussed with patient in length on appropriate administration of medication when they are due and to be consistent with this dosing.  I have advised patient to check his blood pressure at least once a day with his feet flat on floor while after resting for at least 5 to 10 minutes and record this in a blood pressure log to which she will follow-up in 1 week with his primary care physician with blood pressure log obtained.  I have advised patient that it is not healthy to be walking around with such elevated blood pressure and we need to get this under control.  Patient didt request his sildenafil however I will not be filling this until he gets his blood pressure under control. Patient to work on low-sodium diet as well        Endocrine   Hyperlipidemia associated with type 2 diabetes mellitus (HCC)    Ordered lipid panel, pending results. Work on low cholesterol diet and exercise as tolerated       Type 2 diabetes mellitus with diabetic neuropathy, without long-term current use of insulin (HCC)  Patient states that he has been more compliant on the twice daily dosing of his glipizide however he is not being compliant with checking his glucose daily as directed.  He has picked up the meter kit at the pharmacy however he states he has not yet opened the box.  Long discussion with patient as well and compliance and need to check his glucose daily as this is a way that we can ensure that he is controlling and maintaining his blood sugars and that diabetes is not progressing.  Last A1c was reviewed and it is very high.  Patient does not exercise at current advised patient for strict  administration and and adherence to his medication regimen as well to check his blood glucose daily and share on a log with his primary care provider at his next follow-up visit which has been recommended to schedule in the next 10 days.        Other   Erectile dysfunction    At this time we will be holding on sildenafil refill as his blood pressure is  Not yet controlled.      Acute cough    Cough seems to be resolving however advised patient to continue with Singulair as this has been helping him with his symptoms as well as nightly Zyrtec.  Considering he still has a dry nonproductive cough at times he can also add Flonase once daily and see if this helps however I advised him he wanted take this at least for 5 days for really start working in the system.      COVID-19    Symptoms are slowly starting to improve and cough is slowly resolving.  To note, patient was supposed to follow-up on November 04, 2021 but he was late for his appointment and he was marked as a no-show.  Patient was informed that he needed to come to this appointment to determine if he was okay to go back to work.  At this point in time he is good to go back to work and I have noted as such however he would have probably been likely able to go back to work on the ninth if he did not show up for his appointment.  Also discussed this with the patient.      Medically noncompliant    Still hard to get patient to properly care for himself as far as compliance is concerned with taking his medication as directed and adhering to blood pressure and blood glucose log at home .  Long discussion with patient on maintaining compliance and the need to comply to his regimen so that his health can improve.      Other Visit Diagnoses     Non-seasonal allergic rhinitis, unspecified trigger    -  Primary   Relevant Medications   fluticasone (FLONASE) 50 MCG/ACT nasal spray       Meds ordered this encounter  Medications    fluticasone (FLONASE) 50 MCG/ACT nasal spray    Sig: Place 2 sprays into both nostrils daily.    Dispense:  16 g    Refill:  6    Order Specific Question:   Supervising Provider    Answer:   BEDSOLE, AMY E [[id number]]    Follow-up: Return in about 10 days (around 11/19/2021) for with blood pressure log.    Randall Pancoast, FNP

## 2021-11-09 NOTE — Patient Instructions (Addendum)
°  As discussed in the visit, increase your verapamil to the new dose of 240 mg.  Start monitoring your blood pressure daily, around the same time of day, for the next 2-3 weeks.  Ensure that you have rested for 30 minutes prior to checking your blood pressure. Record your readings and bring them to your next visit.  I still do want to recommend you take your glucose once daily, check it fasting when you wake up and record your findings. Bring this record with you to the next visit.   Continue nightly zyrtec and singulair as these seem to be helping, hopefully you will only continue to feel better.   It was a pleasure seeing you today! Please do not hesitate to reach out with any questions and or concerns.  Regards,   Mort Sawyers FNP-C

## 2021-11-09 NOTE — Assessment & Plan Note (Signed)
Ordered lipid panel, pending results. Work on low cholesterol diet and exercise as tolerated ? ?

## 2021-11-09 NOTE — Assessment & Plan Note (Addendum)
At this time we will be holding on sildenafil refill as his blood pressure is  Not yet controlled.

## 2021-11-09 NOTE — Assessment & Plan Note (Signed)
Patient states that he has been more compliant on the twice daily dosing of his glipizide however he is not being compliant with checking his glucose daily as directed.  He has picked up the meter kit at the pharmacy however he states he has not yet opened the box.  Long discussion with patient as well and compliance and need to check his glucose daily as this is a way that we can ensure that he is controlling and maintaining his blood sugars and that diabetes is not progressing.  Last A1c was reviewed and it is very high.  Patient does not exercise at current advised patient for strict administration and and adherence to his medication regimen as well to check his blood glucose daily and share on a log with his primary care provider at his next follow-up visit which has been recommended to schedule in the next 10 days.

## 2021-11-09 NOTE — Assessment & Plan Note (Signed)
Still hard to get patient to properly care for himself as far as compliance is concerned with taking his medication as directed and adhering to blood pressure and blood glucose log at home .  Long discussion with patient on maintaining compliance and the need to comply to his regimen so that his health can improve.

## 2021-11-09 NOTE — Assessment & Plan Note (Signed)
Patient is noncompliant and has not increased his verapamil dosing to 240 mg from 180 mg however he has picked it up from the pharmacy. when speaking with him it is unclear if he is taking his medications as directed so I have discussed with patient in length on appropriate administration of medication when they are due and to be consistent with this dosing.  I have advised patient to check his blood pressure at least once a day with his feet flat on floor while after resting for at least 5 to 10 minutes and record this in a blood pressure log to which she will follow-up in 1 week with his primary care physician with blood pressure log obtained.  I have advised patient that it is not healthy to be walking around with such elevated blood pressure and we need to get this under control.  Patient didt request his sildenafil however I will not be filling this until he gets his blood pressure under control. Patient to work on low-sodium diet as well

## 2021-12-03 ENCOUNTER — Other Ambulatory Visit: Payer: Self-pay | Admitting: Family

## 2021-12-03 DIAGNOSIS — I10 Essential (primary) hypertension: Secondary | ICD-10-CM

## 2021-12-04 ENCOUNTER — Other Ambulatory Visit: Payer: Self-pay | Admitting: Family

## 2021-12-04 DIAGNOSIS — R051 Acute cough: Secondary | ICD-10-CM

## 2021-12-06 ENCOUNTER — Ambulatory Visit: Payer: BC Managed Care – PPO | Admitting: Internal Medicine

## 2021-12-14 ENCOUNTER — Encounter: Payer: Self-pay | Admitting: Internal Medicine

## 2021-12-14 ENCOUNTER — Other Ambulatory Visit: Payer: Self-pay

## 2021-12-14 ENCOUNTER — Ambulatory Visit (INDEPENDENT_AMBULATORY_CARE_PROVIDER_SITE_OTHER): Payer: BC Managed Care – PPO | Admitting: Internal Medicine

## 2021-12-14 VITALS — BP 134/72 | HR 98 | Temp 97.8°F | Ht 66.0 in | Wt 205.0 lb

## 2021-12-14 DIAGNOSIS — R053 Chronic cough: Secondary | ICD-10-CM

## 2021-12-14 DIAGNOSIS — E1140 Type 2 diabetes mellitus with diabetic neuropathy, unspecified: Secondary | ICD-10-CM

## 2021-12-14 DIAGNOSIS — I10 Essential (primary) hypertension: Secondary | ICD-10-CM

## 2021-12-14 DIAGNOSIS — R011 Cardiac murmur, unspecified: Secondary | ICD-10-CM

## 2021-12-14 MED ORDER — BENZONATATE 200 MG PO CAPS: 200.0000 mg | ORAL_CAPSULE | Freq: Three times a day (TID) | ORAL | 1 refills | Status: DC | PRN

## 2021-12-14 NOTE — Progress Notes (Signed)
Subjective:    Patient ID: Randall Pace, male    DOB: [birth year], 66 y.o.   MRN: [id number]  HPI Here for follow up of poorly controlled diabetes and HTN  Lab Results  Component Value Date   HGBA1C 10.8 (A) 11/01/2021   He states this is all about his not eating well Weight is up Despite this result---he hasn't really done what he should do Continues on metformin bid and glipizide daily  BP better now No chest pain or SOB  Still has cough since COVID Not as constant He thought the montelukast helped but not sure Long standing allergy issues---immunotherapy as child Seasonal as adult---but seems more constant now On certirizine also--as well as flonase  Current Outpatient Medications on File Prior to Visit  Medication Sig Dispense Refill   aspirin 81 MG tablet Take 81 mg by mouth daily.     atorvastatin (LIPITOR) 40 MG tablet TAKE 1 TABLET(40 MG) BY MOUTH DAILY 90 tablet 3   Blood Glucose Monitoring Suppl (ONE TOUCH ULTRA 2) w/Device KIT Check blood glucose qam 1 kit 0   cetirizine (ZYRTEC ALLERGY) 10 MG tablet Take 1 tablet (10 mg total) by mouth daily. 30 tablet 6   fenofibrate 54 MG tablet TAKE 1 TABLET(54 MG) BY MOUTH DAILY 90 tablet 3   fluticasone (FLONASE) 50 MCG/ACT nasal spray Place 2 sprays into both nostrils daily. 16 g 6   glipiZIDE (GLUCOTROL) 10 MG tablet TAKE 1 TABLET BY MOUTH TWICE DAILY BEFORE MEALS 180 tablet 3   Lancets 30G MISC 1 each by Does not apply route 2 (two) times daily. 200 each 1   metFORMIN (GLUCOPHAGE) 1000 MG tablet Take 1 tablet (1,000 mg total) by mouth 2 (two) times daily with a meal. 180 tablet 3   montelukast (SINGULAIR) 10 MG tablet TAKE 1 TABLET(10 MG) BY MOUTH AT BEDTIME 90 tablet 1   omeprazole (PRILOSEC) 20 MG capsule TAKE 1 CAPSULE(20 MG) BY MOUTH DAILY 90 capsule 3   ONETOUCH ULTRA test strip USE TO TEST TWICE DAILY 200 strip 1   sildenafil (VIAGRA) 100 MG tablet TAKE 1/2 TO 1 TABLET(50 TO 100 MG) BY MOUTH DAILY AS NEEDED FOR  ERECTILE DYSFUNCTION 10 tablet 11   triamcinolone ointment (KENALOG) 0.5 % Apply 1 application topically 2 (two) times daily. 30 g 0   valsartan-hydrochlorothiazide (DIOVAN-HCT) 320-25 MG tablet TAKE 1 TABLET BY MOUTH DAILY 90 tablet 3   verapamil (CALAN-SR) 240 MG CR tablet Take 1 tablet (240 mg total) by mouth at bedtime. 30 tablet 0   Vitamin D, Ergocalciferol, (DRISDOL) 1.25 MG (50000 UNIT) CAPS capsule Take 1 capsule (50,000 Units total) by mouth every 7 (seven) days for 8 doses. 8 capsule 0   No current facility-administered medications on file prior to visit.    No Known Allergies  Past Medical History:  Diagnosis Date   Arthritis    low back   Diabetes mellitus    GERD (gastroesophageal reflux disease)    History of chicken pox    Hypertension    Migraine    Patellofemoral pain syndrome    Sleep apnea     Past Surgical History:  Procedure Laterality Date   NASAL SINUS SURGERY     palatoplasts     TONSILLECTOMY     uvoloplasty      Family History  Problem Relation Age of Onset   Cancer Father        prostate    Social History   Socioeconomic History  Marital status: Married    Spouse name: Not on file   Number of children: 2   Years of education: Not on file   Highest education level: Not on file  Occupational History   Occupation: Scientist, clinical (histocompatibility and immunogenetics): [employer]  Tobacco Use   Smoking status: Never    Passive exposure: Past   Smokeless tobacco: Never  Vaping Use   Vaping Use: Never used  Substance and Sexual Activity   Alcohol use: Yes    Comment: occasional   Drug use: No   Sexual activity: Yes  Other Topics Concern   Not on file  Social History Narrative   Not on file   Social Determinants of Health   Financial Resource Strain: Not on file  Food Insecurity: Not on file  Transportation Needs: Not on file  Physical Activity: Not on file  Stress: Not on file  Social Connections: Not on file  Intimate Partner Violence: Not on file   Review  of Systems Still not sleeping well---but some better lately     Objective:   Physical Exam Constitutional:      Appearance: Normal appearance.  Cardiovascular:     Rate and Rhythm: Normal rate and regular rhythm.     Heart sounds:    No gallop.     Comments: Gr 3/6 mitral systolic murmur Pulmonary:     Effort: Pulmonary effort is normal.     Breath sounds: Normal breath sounds. No wheezing or rales.  Musculoskeletal:     Cervical back: Neck supple.     Right lower leg: No edema.     Left lower leg: No edema.  Lymphadenopathy:     Cervical: No cervical adenopathy.  Neurological:     Mental Status: He is alert.           Assessment & Plan:

## 2021-12-14 NOTE — Assessment & Plan Note (Signed)
Out of control due to dietary non compliance Will give him 3 months and recheck Plan to add jardiance or farxiga to the glipizide 10 and metformin 1000 bid if not better

## 2021-12-14 NOTE — Assessment & Plan Note (Signed)
BP Readings from Last 3 Encounters:  12/14/21 134/72  11/09/21 (!) 162/94  11/01/21 (!) 161/101   Better now Verapamil 240mg , valsartan/HCTZ 320/25

## 2021-12-14 NOTE — Assessment & Plan Note (Signed)
Seemed aortic in the past--now more in mitral area Will check echo

## 2021-12-14 NOTE — Patient Instructions (Signed)
You can also take allegra (fexofenadine 180mg ) daily

## 2021-12-14 NOTE — Assessment & Plan Note (Signed)
Relates to drainage Montelukast may have helped some Will Rx tessalon Add fexofenadine 180 daily May need pulmonary referral

## 2021-12-28 ENCOUNTER — Other Ambulatory Visit: Payer: Self-pay | Admitting: Family

## 2022-01-12 ENCOUNTER — Telehealth: Payer: Self-pay | Admitting: Internal Medicine

## 2022-01-12 DIAGNOSIS — R079 Chest pain, unspecified: Secondary | ICD-10-CM

## 2022-01-12 NOTE — Telephone Encounter (Signed)
Pt called stating that he needs the cardiology because he has been having chest pain ?

## 2022-01-12 NOTE — Telephone Encounter (Signed)
Left message on verified VM and DPR to find out what he wants to see cardiology for. ?

## 2022-01-12 NOTE — Telephone Encounter (Signed)
Pt called stating that he would like a referral to a Cardiologist with Dr Jake Bathe at [address] [place]. Please advise. ?

## 2022-01-13 NOTE — Telephone Encounter (Signed)
Spoke to pt. He said he has discussed his left arem feeling different from the right. Dr Anne Fu requires referral from PCP. ?

## 2022-01-15 NOTE — Telephone Encounter (Signed)
Referral placed.

## 2022-01-17 ENCOUNTER — Other Ambulatory Visit: Payer: Self-pay | Admitting: Family

## 2022-01-17 DIAGNOSIS — I10 Essential (primary) hypertension: Secondary | ICD-10-CM

## 2022-01-31 ENCOUNTER — Ambulatory Visit (INDEPENDENT_AMBULATORY_CARE_PROVIDER_SITE_OTHER): Payer: BC Managed Care – PPO | Admitting: Cardiology

## 2022-01-31 ENCOUNTER — Encounter: Payer: Self-pay | Admitting: Cardiology

## 2022-01-31 ENCOUNTER — Encounter: Payer: Self-pay | Admitting: *Deleted

## 2022-01-31 DIAGNOSIS — E1169 Type 2 diabetes mellitus with other specified complication: Secondary | ICD-10-CM

## 2022-01-31 DIAGNOSIS — R0602 Shortness of breath: Secondary | ICD-10-CM | POA: Diagnosis not present

## 2022-01-31 DIAGNOSIS — R011 Cardiac murmur, unspecified: Secondary | ICD-10-CM

## 2022-01-31 DIAGNOSIS — R072 Precordial pain: Secondary | ICD-10-CM | POA: Insufficient documentation

## 2022-01-31 DIAGNOSIS — E785 Hyperlipidemia, unspecified: Secondary | ICD-10-CM

## 2022-01-31 DIAGNOSIS — R079 Chest pain, unspecified: Secondary | ICD-10-CM

## 2022-01-31 NOTE — Progress Notes (Signed)
?Cardiology Office Note:   ? ?Date:  01/31/2022  ? ?ID:  Randall Pace, DOB 1956-01-28, MRN [id number] ? ?PCP:  Venia Carbon, MD ?  ?[facility] Providers ?Cardiologist:  None    ? ?Referring MD: Venia Carbon, MD  ? ?History of Present Illness:   ? ?Randall Pace is a 66 y.o. male here for the evaluation of chest pain at the request of Dr. Silvio Pate. ? ?On 01/12/2022 he contacted his PCP and reported having chest pain and left arm discomfort. He requested a referral to cardiology. ? ?He last saw Dr. Silvio Pate on 12/14/2021. A 3/6 mitral systolic murmur was heard on exam. His blood pressure was 134/72, overall improved compared with prior visits. On verapamil 240 mg, valsartan/HCTZ 320/25. Also, his type 2 diabetes with diabetic neuropathy was uncontrolled due to dietary non compliance. He was going to work on diet and exercise for 3 months, with plans to add jardiance or farxiga if not better. ? ?Today: ?Overall, he appears well. "Off and on", he experiences episodes of chest discomfort that causes him to put his hand over his chest. Every once in a while it is a little more severe.  ? ?Last night or the night before, he noticed palpitations at night that "felt like his body was shaking with his heart beat". ? ?For his work he travels door-to-door. Lately he notices more shortness of breath and difficulty with pushing himself to get up driveways, or needing to slow down while climbing stairs. Also he is unable to hold notes as long while singing in the choir, and he may need to take a breath while talking. ? ?He has never been a smoker. In his family he does not know of any history of cardiovascular disease. His father died with 3 types of concurrent cancers. ? ?He denies any peripheral edema. No lightheadedness, headaches, syncope, orthopnea, or PND. ? ? ?Past Medical History:  ?Diagnosis Date  ? Arthritis   ? low back  ? Diabetes mellitus   ? GERD (gastroesophageal reflux disease)   ? History of chicken pox    ? Hypertension   ? Migraine   ? Patellofemoral pain syndrome   ? Sleep apnea   ? ? ?Past Surgical History:  ?Procedure Laterality Date  ? NASAL SINUS SURGERY    ? palatoplasts    ? TONSILLECTOMY    ? uvoloplasty    ? ? ?Current Medications: ?Current Meds  ?Medication Sig  ? aspirin 81 MG tablet Take 81 mg by mouth daily.  ? atorvastatin (LIPITOR) 40 MG tablet TAKE 1 TABLET(40 MG) BY MOUTH DAILY  ? benzonatate (TESSALON) 200 MG capsule Take 1 capsule (200 mg total) by mouth 3 (three) times daily as needed for cough.  ? Blood Glucose Monitoring Suppl (ONE TOUCH ULTRA 2) w/Device KIT Check blood glucose qam  ? cetirizine (ZYRTEC ALLERGY) 10 MG tablet Take 1 tablet (10 mg total) by mouth daily.  ? fenofibrate 54 MG tablet TAKE 1 TABLET(54 MG) BY MOUTH DAILY  ? fluticasone (FLONASE) 50 MCG/ACT nasal spray Place 2 sprays into both nostrils daily.  ? glipiZIDE (GLUCOTROL) 10 MG tablet TAKE 1 TABLET BY MOUTH TWICE DAILY BEFORE MEALS  ? Lancets 30G MISC 1 each by Does not apply route 2 (two) times daily.  ? metFORMIN (GLUCOPHAGE) 1000 MG tablet Take 1 tablet (1,000 mg total) by mouth 2 (two) times daily with a meal.  ? montelukast (SINGULAIR) 10 MG tablet TAKE 1 TABLET(10 MG) BY MOUTH AT BEDTIME  ?  omeprazole (PRILOSEC) 20 MG capsule TAKE 1 CAPSULE(20 MG) BY MOUTH DAILY  ? ONETOUCH ULTRA test strip USE TO TEST TWICE DAILY  ? sildenafil (VIAGRA) 100 MG tablet TAKE 1/2 TO 1 TABLET(50 TO 100 MG) BY MOUTH DAILY AS NEEDED FOR ERECTILE DYSFUNCTION  ? triamcinolone ointment (KENALOG) 0.5 % Apply 1 application topically 2 (two) times daily.  ? valsartan-hydrochlorothiazide (DIOVAN-HCT) 320-25 MG tablet TAKE 1 TABLET BY MOUTH DAILY  ? verapamil (CALAN-SR) 240 MG CR tablet Take 1 tablet (240 mg total) by mouth at bedtime.  ?  ? ?Allergies:   Patient has no known allergies.  ? ?Social History  ? ?Socioeconomic History  ? Marital status: Married  ?  Spouse name: Not on file  ? Number of children: 2  ? Years of education: Not on file  ?  Highest education level: Not on file  ?Occupational History  ? Occupation: Sales  ?  Employer: [employer]  ?Tobacco Use  ? Smoking status: Never  ?  Passive exposure: Past  ? Smokeless tobacco: Never  ?Vaping Use  ? Vaping Use: Never used  ?Substance and Sexual Activity  ? Alcohol use: Yes  ?  Comment: occasional  ? Drug use: No  ? Sexual activity: Yes  ?Other Topics Concern  ? Not on file  ?Social History Narrative  ? Not on file  ? ?Social Determinants of Health  ? ?Financial Resource Strain: Not on file  ?Food Insecurity: Not on file  ?Transportation Needs: Not on file  ?Physical Activity: Not on file  ?Stress: Not on file  ?Social Connections: Not on file  ?  ? ?Family History: ?The patient's family history includes Cancer in his father. ? ?ROS:   ?Please see the history of present illness.    ?(+) Chest discomfort ?(+) Shortness of breath ?(+) Palpitations ?(+) Fatigue ?All other systems reviewed and are negative. ? ?EKGs/Labs/Other Studies Reviewed:   ? ?The following studies were reviewed today: ? ?No prior cardiovascular studies available. ? ? ?EKG:  EKG is personally reviewed and interpreted. ?01/31/2022: Sinus tachycardia. Rate 107 bpm. Nonspecific ST/T wave change. ? ?Recent Labs: ?11/01/2021: ALT 19; BUN 30; Creatinine, Ser 1.13; Hemoglobin 12.4; Platelets 327.0; Potassium 4.4; Sodium 134  ? ?Recent Lipid Panel ?   ?Component Value Date/Time  ? CHOL 154 11/01/2021 0832  ? TRIG 241.0 (H) 11/01/2021 4166  ? HDL 38.70 (L) 11/01/2021 0630  ? CHOLHDL 4 11/01/2021 0832  ? VLDL 48.2 (H) 11/01/2021 1601  ? [place] 50 03/23/2021 0818  ? LDLDIRECT 78.0 11/01/2021 0832  ? ? ? ?Risk Assessment/Calculations:   ? ? ?    ? ?Physical Exam:   ? ?VS:  BP (!) 156/86   Pulse (!) 107   Ht $R'5\' 6"'uF$  (1.676 m)   Wt 205 lb (93 kg)   SpO2 95%   BMI 33.09 kg/m?    ? ?Wt Readings from Last 3 Encounters:  ?01/31/22 205 lb (93 kg)  ?12/14/21 205 lb (93 kg)  ?11/09/21 203 lb (92.1 kg)  ?  ? ?GEN: Well nourished, well developed in no acute  distress ?HEENT: Normal ?NECK: No JVD; No carotid bruits ?LYMPHATICS: No lymphadenopathy ?CARDIAC: Tachycardic, 3/6 systolic murmur, no rubs, no gallops ?RESPIRATORY:  Clear to auscultation without rales, wheezing or rhonchi  ?ABDOMEN: Soft, non-tender, non-distended ?MUSCULOSKELETAL:  No edema; No deformity  ?SKIN: Warm and dry ?NEUROLOGIC:  Alert and oriented x 3 ?PSYCHIATRIC:  Normal affect  ? ?ASSESSMENT:   ? ?1. Precordial chest pain   ?2.  Heart murmur   ?3. Hyperlipidemia associated with type 2 diabetes mellitus ([place])   ?4. Shortness of breath   ? ?PLAN:   ? ?In order of problems listed above: ? ?Precordial chest pain ?Given his chest discomfort, diabetes, hyperlipidemia, hypertension, we will go ahead and pursue Lexiscan stress test.  If highly abnormal, we will pursue cardiac catheterization.  We described both test in detail. ? ?If testing is unremarkable, encourage weight loss, conditioning efforts.  Obviously if symptoms worsen or become more worrisome, further cardiac testing may be warranted. ? ?Heart murmur ?Agree with Dr. Silvio Pate.  We will go ahead and check an echocardiogram. ? ?Hyperlipidemia associated with type 2 diabetes mellitus ([place]) ?Currently on atorvastatin 40 mg fenofibrate 54 mg.  LDL 78, triglycerides 241. ? ?Shortness of breath ?Checking echocardiogram to ensure proper structure and function.  Also this will help evaluate his heart murmur.  May be aortic valve. ? ? ? ?Shared Decision Making/Informed Consent ?The risks [chest pain, shortness of breath, cardiac arrhythmias, dizziness, blood pressure fluctuations, myocardial infarction, stroke/transient ischemic attack, nausea, vomiting, allergic reaction, radiation exposure, metallic taste sensation and life-threatening complications (estimated to be 1 in 10,000)], benefits (risk stratification, diagnosing coronary artery disease, treatment guidance) and alternatives of a nuclear stress test were discussed in detail with Mr. Davtyan and he  agrees to proceed.  ? ?Follow-up: 6 months. ? ?Medication Adjustments/Labs and Tests Ordered: ?Current medicines are reviewed at length with the patient today.  Concerns regarding medicines are outlined

## 2022-01-31 NOTE — Assessment & Plan Note (Addendum)
Given his chest discomfort, diabetes, hyperlipidemia, hypertension, we will go ahead and pursue Lexiscan stress test.  If highly abnormal, we will pursue cardiac catheterization.  We described both test in detail. ? ?If testing is unremarkable, encourage weight loss, conditioning efforts.  Obviously if symptoms worsen or become more worrisome, further cardiac testing may be warranted. ?

## 2022-01-31 NOTE — Patient Instructions (Signed)
Medication Instructions:  ?The current medical regimen is effective;  continue present plan and medications. ? ?*If you need a refill on your cardiac medications before your next appointment, please call your pharmacy* ? ?Testing/Procedures: ?Your physician has requested that you have an echocardiogram. Echocardiography is a painless test that uses sound waves to create images of your heart. It provides your doctor with information about the size and shape of your heart and how well your heart?s chambers and valves are working. This procedure takes approximately one hour. There are no restrictions for this procedure. ? ?Your physician has requested that you have a lexiscan myoview. For further information please visit [email or web address] Please follow instruction sheet, as given. ? ?Follow-Up: ?At [facility], you and your health needs are our priority.  As part of our continuing mission to provide you with exceptional heart care, we have created designated Provider Care Teams.  These Care Teams include your primary Cardiologist (physician) and Advanced Practice Providers (APPs -  Physician Assistants and Nurse Practitioners) who all work together to provide you with the care you need, when you need it. ? ?We recommend signing up for the patient portal called "MyChart".  Sign up information is provided on this After Visit Summary.  MyChart is used to connect with patients for Virtual Visits (Telemedicine).  Patients are able to view lab/test results, encounter notes, upcoming appointments, etc.  Non-urgent messages can be sent to your provider as well.   ?To learn more about what you can do with MyChart, go to [email or web address].   ? ?Your next appointment:   ?6 month(s) ? ?The format for your next appointment:   ?In Person ? ?Provider:   ?Dr Donato Schultz ? ?If primary card or EP is not listed click here to update    :1}  ? ? ?Thank you for choosing [facility]!! ? ? ? ?

## 2022-01-31 NOTE — Assessment & Plan Note (Signed)
Checking echocardiogram to ensure proper structure and function.  Also this will help evaluate his heart murmur.  May be aortic valve. ?

## 2022-01-31 NOTE — Assessment & Plan Note (Signed)
Currently on atorvastatin 40 mg fenofibrate 54 mg.  LDL 78, triglycerides 241. ?

## 2022-01-31 NOTE — Assessment & Plan Note (Signed)
Agree with Dr. Alphonsus Sias.  We will go ahead and check an echocardiogram. ?

## 2022-02-01 ENCOUNTER — Telehealth: Payer: Self-pay | Admitting: Internal Medicine

## 2022-02-01 DIAGNOSIS — I10 Essential (primary) hypertension: Secondary | ICD-10-CM

## 2022-02-01 NOTE — Telephone Encounter (Signed)
Pharmacy has been changed

## 2022-02-01 NOTE — Telephone Encounter (Signed)
Pt would like his pharmacy to be switched to [facility] [place]. ?

## 2022-02-08 ENCOUNTER — Telehealth (HOSPITAL_COMMUNITY): Payer: Self-pay | Admitting: *Deleted

## 2022-02-08 NOTE — Telephone Encounter (Signed)
Patient given detailed instructions per Myocardial Perfusion Study Information Sheet for the test on 02/16/2022 at 7:15. Patient notified to arrive 15 minutes early and that it is imperative to arrive on time for appointment to keep from having the test rescheduled. ? If you need to cancel or reschedule your appointment, please call the office within 24 hours of your appointment. . Patient verbalized understanding.Nelson Chimes S ? ? ?

## 2022-02-16 ENCOUNTER — Ambulatory Visit (HOSPITAL_BASED_OUTPATIENT_CLINIC_OR_DEPARTMENT_OTHER): Payer: BC Managed Care – PPO

## 2022-02-16 ENCOUNTER — Ambulatory Visit (HOSPITAL_COMMUNITY): Payer: BC Managed Care – PPO | Attending: Cardiology

## 2022-02-16 DIAGNOSIS — R072 Precordial pain: Secondary | ICD-10-CM | POA: Diagnosis not present

## 2022-02-16 DIAGNOSIS — R011 Cardiac murmur, unspecified: Secondary | ICD-10-CM | POA: Insufficient documentation

## 2022-02-16 DIAGNOSIS — R0602 Shortness of breath: Secondary | ICD-10-CM | POA: Diagnosis not present

## 2022-02-16 LAB — MYOCARDIAL PERFUSION IMAGING
LV dias vol: 78 mL (ref 62–150)
LV sys vol: 25 mL
Nuc Stress EF: 68 %
Peak HR: 87 {beats}/min
Rest HR: 78 {beats}/min
Rest Nuclear Isotope Dose: 10.5 mCi
SDS: 0
SRS: 0
SSS: 0
ST Depression (mm): 0 mm
Stress Nuclear Isotope Dose: 32.4 mCi
TID: 0.94

## 2022-02-16 LAB — ECHOCARDIOGRAM COMPLETE
AR max vel: 1.24 cm2
AV Area VTI: 1.28 cm2
AV Area mean vel: 1.22 cm2
AV Mean grad: 19.2 mmHg
AV Peak grad: 33.8 mmHg
Ao pk vel: 2.91 m/s
Area-P 1/2: 3.62 cm2
Height: 66 in
S' Lateral: 2.4 cm
Weight: 3280 oz

## 2022-02-16 MED ORDER — TECHNETIUM TC 99M TETROFOSMIN IV KIT
32.4000 | PACK | Freq: Once | INTRAVENOUS | Status: AC | PRN
  Administered 2022-02-16: 32.4 via INTRAVENOUS
  Filled 2022-02-16: qty 33

## 2022-02-16 MED ORDER — REGADENOSON 0.4 MG/5ML IV SOLN
0.4000 mg | Freq: Once | INTRAVENOUS | Status: AC
  Administered 2022-02-16: 0.4 mg via INTRAVENOUS

## 2022-02-16 MED ORDER — TECHNETIUM TC 99M TETROFOSMIN IV KIT
10.5000 | PACK | Freq: Once | INTRAVENOUS | Status: AC | PRN
  Administered 2022-02-16: 10.5 via INTRAVENOUS
  Filled 2022-02-16: qty 11

## 2022-02-20 ENCOUNTER — Telehealth: Payer: Self-pay | Admitting: *Deleted

## 2022-02-20 DIAGNOSIS — I350 Nonrheumatic aortic (valve) stenosis: Secondary | ICD-10-CM

## 2022-02-20 NOTE — Telephone Encounter (Signed)
Randall Bathe, MD  ?02/16/2022  7:30 PM EDT   ?  ?Normal pump function ?Mild to moderate aortic valve stenosis (narrowing).  ?Repeat ECHO in one year to monitor.  ?Donato Schultz, MD  ? ?

## 2022-03-09 ENCOUNTER — Ambulatory Visit (INDEPENDENT_AMBULATORY_CARE_PROVIDER_SITE_OTHER): Payer: BC Managed Care – PPO | Admitting: Nurse Practitioner

## 2022-03-09 ENCOUNTER — Ambulatory Visit
Admission: RE | Admit: 2022-03-09 | Discharge: 2022-03-09 | Disposition: A | Discharge status: Home / Self Care | Payer: BC Managed Care – PPO | Source: Ambulatory Visit | Attending: Nurse Practitioner | Admitting: Nurse Practitioner

## 2022-03-09 VITALS — BP 154/92 | HR 99 | Temp 97.5°F | Resp 14 | Ht 66.0 in | Wt 210.4 lb

## 2022-03-09 DIAGNOSIS — T148XXA Other injury of unspecified body region, initial encounter: Secondary | ICD-10-CM | POA: Diagnosis not present

## 2022-03-09 DIAGNOSIS — M7989 Other specified soft tissue disorders: Secondary | ICD-10-CM | POA: Diagnosis not present

## 2022-03-09 DIAGNOSIS — R600 Localized edema: Secondary | ICD-10-CM | POA: Diagnosis not present

## 2022-03-09 DIAGNOSIS — S8991XA Unspecified injury of right lower leg, initial encounter: Secondary | ICD-10-CM | POA: Insufficient documentation

## 2022-03-09 NOTE — Patient Instructions (Signed)
Nice to see you today ?I will be in touch in regards to the labs and ultrasound ?If you get a sudden onset shortness of breath or chest pain go to the nearest emergency department ?Follow up if no improvement  ?

## 2022-03-09 NOTE — Assessment & Plan Note (Signed)
Patient having edema the entirety of the right lower extremity.  Started sudden onset after a popping sound to the right hamstring area.  Orders stat ultrasound of right lower extremity to rule out DVT.  No history of the same.  With injury and swelling and calf measurement discrepancy along with calf tenderness.  Pending results ?

## 2022-03-09 NOTE — Progress Notes (Signed)
? ?Acute Office Visit ? ?Subjective:  ? ?  ?Patient ID: Randall Pace, male    DOB: 02/23/1956, 66 y.o.   MRN: [id number] ? ?Chief Complaint  ?Patient presents with  ? Leg Swelling  ?  noticed earlier today that his right leg is black and blue and swollen, right foot is swollen. He did pop a hamstring of the right leg last week but there was no bruising present then.  ? ? ?HPI ?Patient is in today for Leg swelling ? ?States he notied that he had some swelling to the leg over the weekend. States he pulled a hamstring trying to avoid a dog. States that today he was having a BM and noticed the burising. Foot started swelling today.  ?No history of DVT or PE. States that he has pain the the back of the thigh to the knee. Has been taking aleve with unknown relief. States the pain is described as sharp stabbing pain  ?Sitting makes it work ? ? ? ?Review of Systems  ?Constitutional:  Negative for chills and fever.  ?Respiratory:  Negative for shortness of breath.   ?Cardiovascular:  Positive for leg swelling.  ?Neurological:  Positive for weakness. Negative for tingling.  ? ? ?   ?Objective:  ?  ?BP (!) 154/92   Pulse 99   Temp (!) 97.5 ?F (36.4 ?C)   Resp 14   Ht 5\' 6"  (1.676 m)   Wt 210 lb 6 oz (95.4 kg)   SpO2 98%   BMI 33.96 kg/m?  ? ? ?Physical Exam ?Vitals and nursing note reviewed.  ?Constitutional:   ?   Appearance: Normal appearance. He is obese.  ?Cardiovascular:  ?   Rate and Rhythm: Normal rate and regular rhythm.  ?   Pulses:     ?     Dorsalis pedis pulses are 2+ on the right side and 2+ on the left side.  ?   Heart sounds: Murmur heard.  ?Pulmonary:  ?   Effort: Pulmonary effort is normal.  ?   Breath sounds: Normal breath sounds.  ?Musculoskeletal:     ?   General: Tenderness and signs of injury present.  ?   Right hip: Normal.  ?   Left hip: Normal.  ?   Right lower leg: Edema present.  ?     Legs: ? ?   Comments: 42 cm right leg ?40 cm left leg ? ?Pain and muscle tightness along the right  circle ? ?Patient also experiencing tenderness to palpation along posterior calf  ?Skin: ?   Findings: Bruising present.  ? ?    ?   Comments: Ecchymosis traveling down the right medial leg proximal to distal all the way to level medial ankle  ?Neurological:  ?   General: No focal deficit present.  ?   Mental Status: He is alert.  ?   Deep Tendon Reflexes:  ?   Reflex Scores: ?     Patellar reflexes are 2+ on the right side and 2+ on the left side. ?   Comments: Bilateral lower extremity strength 5/5  ? ? ?No results found for any visits on 03/09/22. ? ? ?   ?Assessment & Plan:  ? ?Problem List Items Addressed This Visit   ? ?  ? Other  ? Lower extremity edema - Primary  ?  Patient having edema the entirety of the right lower extremity.  Started sudden onset after a popping sound to the right hamstring area.  Orders  stat ultrasound of right lower extremity to rule out DVT.  No history of the same.  With injury and swelling and calf measurement discrepancy along with calf tenderness.  Pending results ? ?  ?  ? Relevant Orders  ? US Venous Img Lower Unilateral Right  ? Bruising  ?  Large bruising from right proximal thigh to right medial foot.  Pending CBC ? ?  ?  ? Relevant Orders  ? CBC  ? Injury of right leg  ?  Was doing a shifting movement and felt and heard a pop/snap to the right hamstring area.  Patient does have tenderness and difficulty with direct pressure on the area.  Patient is able to ambulate good strength in leg and appropriate reflexes. ? ?  ?  ? Relevant Orders  ? CBC  ? ? ?No orders of the defined types were placed in this encounter. ? ? ?No follow-ups on file. ? ?Audria Nine, NP ? ? ?

## 2022-03-09 NOTE — Assessment & Plan Note (Signed)
Large bruising from right proximal thigh to right medial foot.  Pending CBC ?

## 2022-03-09 NOTE — Assessment & Plan Note (Signed)
Was doing a shifting movement and felt and heard a pop/snap to the right hamstring area.  Patient does have tenderness and difficulty with direct pressure on the area.  Patient is able to ambulate good strength in leg and appropriate reflexes. ?

## 2022-03-10 ENCOUNTER — Other Ambulatory Visit: Payer: Self-pay | Admitting: Nurse Practitioner

## 2022-03-10 ENCOUNTER — Encounter: Payer: Self-pay | Admitting: Nurse Practitioner

## 2022-03-10 DIAGNOSIS — M6289 Other specified disorders of muscle: Secondary | ICD-10-CM

## 2022-03-10 LAB — CBC
HCT: 32.6 % — ABNORMAL LOW (ref 39.0–52.0)
Hemoglobin: 11.1 g/dL — ABNORMAL LOW (ref 13.0–17.0)
MCHC: 33.9 g/dL (ref 30.0–36.0)
MCV: 87.1 fl (ref 78.0–100.0)
Platelets: 261 10*3/uL (ref 150.0–400.0)
RBC: 3.74 Mil/uL — ABNORMAL LOW (ref 4.22–5.81)
RDW: 13.4 % (ref 11.5–15.5)
WBC: 6.6 10*3/uL (ref 4.0–10.5)

## 2022-03-10 MED ORDER — OMEPRAZOLE 20 MG PO CPDR: DELAYED_RELEASE_CAPSULE | ORAL | 0 refills | Status: DC

## 2022-03-10 MED ORDER — ATORVASTATIN CALCIUM 40 MG PO TABS: ORAL_TABLET | ORAL | 0 refills | Status: DC

## 2022-03-10 MED ORDER — VALSARTAN-HYDROCHLOROTHIAZIDE 320-25 MG PO TABS: 1.0000 | ORAL_TABLET | Freq: Every day | ORAL | 0 refills | Status: DC

## 2022-03-10 MED ORDER — SILDENAFIL CITRATE 100 MG PO TABS
ORAL_TABLET | ORAL | 11 refills | Status: DC
Start: 2022-03-10 — End: 2023-03-19

## 2022-03-10 MED ORDER — METFORMIN HCL 1000 MG PO TABS
1000.0000 mg | ORAL_TABLET | Freq: Two times a day (BID) | ORAL | 0 refills | Status: DC
Start: 2022-03-10 — End: 2022-06-06

## 2022-03-10 MED ORDER — FENOFIBRATE 54 MG PO TABS: ORAL_TABLET | ORAL | 0 refills | Status: DC

## 2022-03-10 MED ORDER — VERAPAMIL HCL ER 240 MG PO TBCR: 240.0000 mg | EXTENDED_RELEASE_TABLET | Freq: Every day | ORAL | 0 refills | Status: DC

## 2022-03-10 MED ORDER — CYCLOBENZAPRINE HCL 5 MG PO TABS
5.0000 mg | ORAL_TABLET | Freq: Two times a day (BID) | ORAL | 0 refills | Status: DC | PRN
Start: 2022-03-10 — End: 2022-12-13

## 2022-03-10 MED ORDER — GLIPIZIDE 10 MG PO TABS: 10.0000 mg | ORAL_TABLET | Freq: Two times a day (BID) | ORAL | 0 refills | Status: DC

## 2022-03-10 NOTE — Telephone Encounter (Signed)
Patient states he is having trouble with getting [facility] to [facility] and asked for [place] to re send. This has been done. ?

## 2022-03-10 NOTE — Addendum Note (Signed)
Addended by: Kris Mouton on: 03/10/2022 10:37 AM ? ? Modules accepted: Orders ? ?

## 2022-03-14 ENCOUNTER — Encounter: Payer: Self-pay | Admitting: Internal Medicine

## 2022-03-14 ENCOUNTER — Ambulatory Visit (INDEPENDENT_AMBULATORY_CARE_PROVIDER_SITE_OTHER): Payer: BC Managed Care – PPO | Admitting: Internal Medicine

## 2022-03-14 VITALS — BP 140/78 | HR 88 | Temp 97.9°F | Ht 66.0 in | Wt 209.0 lb

## 2022-03-14 DIAGNOSIS — M79604 Pain in right leg: Secondary | ICD-10-CM

## 2022-03-14 DIAGNOSIS — E1140 Type 2 diabetes mellitus with diabetic neuropathy, unspecified: Secondary | ICD-10-CM | POA: Diagnosis not present

## 2022-03-14 LAB — POCT GLYCOSYLATED HEMOGLOBIN (HGB A1C): Hemoglobin A1C: 11.8 % — AB (ref 4.0–5.6)

## 2022-03-14 MED ORDER — DAPAGLIFLOZIN PROPANEDIOL 5 MG PO TABS: 5.0000 mg | ORAL_TABLET | Freq: Every day | ORAL | 3 refills | Status: DC

## 2022-03-14 NOTE — Assessment & Plan Note (Signed)
Lab Results  ?Component Value Date  ? HGBA1C 11.8 (A) 03/14/2022  ? ?Actually worse ?Not checking ?Still with no dietary control ? ?Will add farxiga 5mg   ?Continue metformin 1000 bid and glipizide 10 bid ?

## 2022-03-14 NOTE — Progress Notes (Signed)
? ?Subjective:  ? ? Patient ID: Randall Pace, male    DOB: April 29, 1956, 67 y.o.   MRN: [id number] ? ?HPI ?Here for follow up of poorly controlled diabetes ? ?He has not made any dietary changes ?Eats out a fair bit--often do take out ?Not checking sugars ?Has been drinking some sugared sodas ? ?Ongoing left leg swelling ?Happened after jerking away from a dog and he felt a pop ?Venous study negative for DVT ?Activity of due to this now ? ?Current Outpatient Medications on File Prior to Visit  ?Medication Sig Dispense Refill  ? aspirin 81 MG tablet Take 81 mg by mouth daily.    ? atorvastatin (LIPITOR) 40 MG tablet TAKE 1 TABLET(40 MG) BY MOUTH DAILY 90 tablet 0  ? cetirizine (ZYRTEC ALLERGY) 10 MG tablet Take 1 tablet (10 mg total) by mouth daily. 30 tablet 6  ? cyclobenzaprine (FLEXERIL) 5 MG tablet Take 1 tablet (5 mg total) by mouth 2 (two) times daily as needed for muscle spasms. 15 tablet 0  ? fenofibrate 54 MG tablet TAKE 1 TABLET(54 MG) BY MOUTH DAILY 90 tablet 0  ? fexofenadine (ALLEGRA) 180 MG tablet Take 180 mg by mouth daily.    ? fluticasone (FLONASE) 50 MCG/ACT nasal spray Place 2 sprays into both nostrils daily. 16 g 6  ? glipiZIDE (GLUCOTROL) 10 MG tablet Take 1 tablet (10 mg total) by mouth 2 (two) times daily before a meal. 180 tablet 0  ? Lancets 30G MISC 1 each by Does not apply route 2 (two) times daily. 200 each 1  ? metFORMIN (GLUCOPHAGE) 1000 MG tablet Take 1 tablet (1,000 mg total) by mouth 2 (two) times daily with a meal. 180 tablet 0  ? omeprazole (PRILOSEC) 20 MG capsule TAKE 1 CAPSULE(20 MG) BY MOUTH DAILY 90 capsule 0  ? ONETOUCH ULTRA test strip USE TO TEST TWICE DAILY 200 strip 1  ? sildenafil (VIAGRA) 100 MG tablet TAKE 1/2 TO 1 TABLET(50 TO 100 MG) BY MOUTH DAILY AS NEEDED FOR ERECTILE DYSFUNCTION 10 tablet 11  ? triamcinolone ointment (KENALOG) 0.5 % Apply 1 application topically 2 (two) times daily. 30 g 0  ? valsartan-hydrochlorothiazide (DIOVAN-HCT) 320-25 MG tablet Take 1  tablet by mouth daily. 90 tablet 0  ? verapamil (CALAN-SR) 240 MG CR tablet Take 1 tablet (240 mg total) by mouth at bedtime. 90 tablet 0  ? montelukast (SINGULAIR) 10 MG tablet TAKE 1 TABLET(10 MG) BY MOUTH AT BEDTIME 90 tablet 1  ? ?No current facility-administered medications on file prior to visit.  ? ? ?No Known Allergies ? ?Past Medical History:  ?Diagnosis Date  ? Arthritis   ? low back  ? Diabetes mellitus   ? GERD (gastroesophageal reflux disease)   ? History of chicken pox   ? Hypertension   ? Migraine   ? Patellofemoral pain syndrome   ? Sleep apnea   ? ? ?Past Surgical History:  ?Procedure Laterality Date  ? NASAL SINUS SURGERY    ? palatoplasts    ? TONSILLECTOMY    ? uvoloplasty    ? ? ?Family History  ?Problem Relation Age of Onset  ? Cancer Father   ?     prostate  ? ? ?Social History  ? ?Socioeconomic History  ? Marital status: Married  ?  Spouse name: Not on file  ? Number of children: 2  ? Years of education: Not on file  ? Highest education level: Not on file  ?Occupational History  ? Occupation:  Sales  ?  Employer: [employer]  ?Tobacco Use  ? Smoking status: Never  ?  Passive exposure: Past  ? Smokeless tobacco: Never  ?Vaping Use  ? Vaping Use: Never used  ?Substance and Sexual Activity  ? Alcohol use: Yes  ?  Comment: occasional  ? Drug use: No  ? Sexual activity: Yes  ?Other Topics Concern  ? Not on file  ?Social History Narrative  ? Not on file  ? ?Social Determinants of Health  ? ?Financial Resource Strain: Not on file  ?Food Insecurity: Not on file  ?Transportation Needs: Not on file  ?Physical Activity: Not on file  ?Stress: Not on file  ?Social Connections: Not on file  ?Intimate Partner Violence: Not on file  ? ?Review of Systems ?No chest pain ?No SOB ? ?   ?Objective:  ? Physical Exam ?Constitutional:   ?   Appearance: Normal appearance.  ?Cardiovascular:  ?   Rate and Rhythm: Normal rate and regular rhythm.  ?   Heart sounds:  ?  No gallop.  ?   Comments: Gr [id number] systolic  murmur ?Pulmonary:  ?   Effort: Pulmonary effort is normal.  ?   Breath sounds: Normal breath sounds. No wheezing or rales.  ?Musculoskeletal:  ?   Comments: Still with swelling in right thigh and calf---with mild hyperpigmentation along medial leg  ?Neurological:  ?   Mental Status: He is alert.  ?   Comments: Mild decreased strength--flexion and extension of right hip  ?  ? ? ? ? ?   ?Assessment & Plan:  ? ?

## 2022-03-16 DIAGNOSIS — H5319 Other subjective visual disturbances: Secondary | ICD-10-CM | POA: Diagnosis not present

## 2022-03-16 DIAGNOSIS — E113293 Type 2 diabetes mellitus with mild nonproliferative diabetic retinopathy without macular edema, bilateral: Secondary | ICD-10-CM | POA: Diagnosis not present

## 2022-03-20 ENCOUNTER — Ambulatory Visit (INDEPENDENT_AMBULATORY_CARE_PROVIDER_SITE_OTHER): Payer: BC Managed Care – PPO | Admitting: Physician Assistant

## 2022-03-20 ENCOUNTER — Ambulatory Visit (INDEPENDENT_AMBULATORY_CARE_PROVIDER_SITE_OTHER): Payer: BC Managed Care – PPO

## 2022-03-20 ENCOUNTER — Encounter: Payer: Self-pay | Admitting: Physician Assistant

## 2022-03-20 DIAGNOSIS — M79661 Pain in right lower leg: Secondary | ICD-10-CM | POA: Diagnosis not present

## 2022-03-20 DIAGNOSIS — M7989 Other specified soft tissue disorders: Secondary | ICD-10-CM | POA: Diagnosis not present

## 2022-03-20 NOTE — Progress Notes (Signed)
Office Visit Note   Patient: Randall Pace           Date of Birth: Nov 21, 1955           MRN: [id number] Visit Date: 03/20/2022              Requested by: [name], MD [address] [place],  [place] [address] PCP: [name], MD  Chief Complaint  Patient presents with   Left Leg - New Patient (Initial Visit)   Right Leg - New Patient (Initial Visit)      HPI: Patient is a pleasant 66 year old gentleman complaining of anterior shin pain bilaterally.  Right greater than left.  He is a few weeks status post hamstring tear for which she was being treated by his primary care provider.  He noticed after the hamstring tear he began also having swelling in his ankle and was told that this was related to the hamstring tear.  He also has a history of low back arthritis.  He sometimes gets feelings like his right leg is going to give out on the right side.  Denies any recent illness.  He has a recent hemoglobin A1c of 11.8  Assessment & Plan: Visit Diagnoses:  1. Pain in right lower leg     Plan: I think some of this has a few multifactorial issues.  1 he does have a history of back problems certainly can contribute to the right lower extremity.  He does not have any weakness on exam.  He would do well to do some physical therapy for both his back and his right lower extremity.  I do not see anything infective going on I think he would do well to wear compression socks as they would help with swelling and certainly with his history of diabetes.  I have given him information on this to do.  He also has a slightly low vitamin D.  Should work with his primary care on supplementation  Follow-Up Instructions: No follow-ups on file.   Ortho Exam  Patient is alert, oriented, no adenopathy, well-dressed, normal affect, normal respiratory effort. Examination right lower extremity he has a negative Homans' sign negative calf pain he has had a recent ultrasound.  He has a little  bit of pitting edema and some just general tenderness over the shins right greater than left.  He has moderate swelling in his ankle but no cellulitis no erythema.  Ankle is somewhat stiff to range of motion but he is able to dorsiflex plantarflex evert and invert.  He has a strong dorsalis pedis pulse  Imaging: XR Tibia/Fibula Right  Result Date: 03/20/2022 2 view radiographs of his tib-fib on the right demonstrate well-maintained alignment no acute fractures no significant degenerative changes in the ankle joint.  No images are attached to the encounter.  Labs: Lab Results  Component Value Date   HGBA1C 11.8 (A) 03/14/2022   HGBA1C 10.8 (A) 11/01/2021   HGBA1C 6.7 (A) 03/23/2021     Lab Results  Component Value Date   ALBUMIN 4.3 11/01/2021   ALBUMIN 4.3 03/23/2021   ALBUMIN 4.3 03/23/2021    Lab Results  Component Value Date   MG 1.4 (L) 03/19/2018   Lab Results  Component Value Date   VD25OH 21.41 (L) 11/02/2021    No results found for: PREALBUMIN    Latest Ref Rng & Units 03/09/2022    3:49 PM 11/01/2021    8:32 AM 03/31/2021    9:42  AM  CBC EXTENDED  WBC 4.0 - 10.5 K/uL 6.6   10.3   6.6    RBC 4.22 - 5.81 Mil/uL 3.74   4.33   4.29    Hemoglobin 13.0 - 17.0 g/dL 72.6   20.3   55.9    HCT 39.0 - 52.0 % 32.6   37.9   37.2    Platelets 150.0 - 400.0 K/uL 261.0   327.0   261.0    NEUT# 1.4 - 7.7 K/uL  7.1   4.6    Lymph# 0.7 - 4.0 K/uL  2.4   1.4       There is no height or weight on file to calculate BMI.  Orders:  Orders Placed This Encounter  Procedures   XR Tibia/Fibula Right   Ambulatory referral to Physical Therapy   No orders of the defined types were placed in this encounter.    Procedures: No procedures performed  Clinical Data: No additional findings.  ROS:  All other systems negative, except as noted in the HPI. Review of Systems  Objective: Vital Signs: There were no vitals taken for this visit.  Specialty Comments:  No specialty  comments available.  PMFS History: Patient Active Problem List   Diagnosis Date Noted   Lower extremity edema 03/09/2022   Bruising 03/09/2022   Injury of right leg 03/09/2022   Precordial chest pain 01/31/2022   Shortness of breath 01/31/2022   Medically noncompliant 11/01/2021   Cough 10/13/2021   Right groin pain 03/23/2021   Heart murmur 03/23/2021   Lumbar spondylosis with myelopathy 06/18/2020   Type 2 diabetes mellitus with diabetic neuropathy, without long-term current use of insulin (HCC) 06/18/2020   Trochanteric bursitis of right hip 06/18/2020   Lumbar degenerative disc disease 06/18/2020   Hyperlipidemia associated with type 2 diabetes mellitus (HCC) 02/25/2018   OSA (obstructive sleep apnea) 02/25/2018   Erectile dysfunction 02/25/2018   Generalized osteoarthritis of multiple sites 09/25/2014   Essential hypertension 12/14/2008   GERD 12/14/2008   Past Medical History:  Diagnosis Date   Arthritis    low back   Diabetes mellitus    GERD (gastroesophageal reflux disease)    History of chicken pox    Hypertension    Migraine    Patellofemoral pain syndrome    Sleep apnea     Family History  Problem Relation Age of Onset   Cancer Father        prostate    Past Surgical History:  Procedure Laterality Date   NASAL SINUS SURGERY     palatoplasts     TONSILLECTOMY     uvoloplasty     Social History   Occupational History   Occupation: Investment banker, corporate: [employer]  Tobacco Use   Smoking status: Never    Passive exposure: Past   Smokeless tobacco: Never  Vaping Use   Vaping Use: Never used  Substance and Sexual Activity   Alcohol use: Yes    Comment: occasional   Drug use: No   Sexual activity: Yes

## 2022-04-04 ENCOUNTER — Ambulatory Visit: Payer: BC Managed Care – PPO | Admitting: Physical Therapy

## 2022-04-11 ENCOUNTER — Encounter: Payer: BC Managed Care – PPO | Admitting: Physical Therapy

## 2022-04-25 ENCOUNTER — Encounter: Payer: BC Managed Care – PPO | Admitting: Physical Therapy

## 2022-05-01 ENCOUNTER — Encounter: Payer: BC Managed Care – PPO | Admitting: Physical Therapy

## 2022-05-04 ENCOUNTER — Encounter: Payer: BC Managed Care – PPO | Admitting: Physical Therapy

## 2022-06-06 ENCOUNTER — Other Ambulatory Visit: Payer: Self-pay | Admitting: Internal Medicine

## 2022-06-06 DIAGNOSIS — I10 Essential (primary) hypertension: Secondary | ICD-10-CM

## 2022-06-11 ENCOUNTER — Other Ambulatory Visit: Payer: Self-pay | Admitting: Internal Medicine

## 2022-06-20 ENCOUNTER — Other Ambulatory Visit: Payer: Self-pay | Admitting: Internal Medicine

## 2022-06-20 DIAGNOSIS — I10 Essential (primary) hypertension: Secondary | ICD-10-CM

## 2022-06-27 ENCOUNTER — Telehealth: Payer: Self-pay | Admitting: Internal Medicine

## 2022-06-27 NOTE — Telephone Encounter (Signed)
Patient called in stating that he has been dizzy since last week. It seems as if the room is spinning. He vomited 1 time on yesterday. I seen him to access nurse to be triaged.

## 2022-06-27 NOTE — Telephone Encounter (Signed)
[facility] Day - Client TELEPHONE ADVICE RECORD AccessNurse Patient Name: Randall Pace Gender: Male DOB: 1955-11-02 Age: 66 Y 5 M 17 D Return Phone Number: [phone] (Primary) Address[place]  [address] Client [facility] Day - Client Client Site [facility] - Day Provider Tillman Abide- MD Contact Type Call Who Is Calling Patient / Member / Family / Caregiver Call Type Triage / Clinical Relationship To Patient Self Return Phone Number [phone] (Primary) Chief Complaint Dizziness Reason for Call Symptomatic / Request for Health Information Initial Comment Caller states that she is transferring a patient to be triaged. The patient has been experiencing dizziness since last week. He has experienced nausea and vomited yesterday. The whole room feels like it is spinning. Translation No Nurse Assessment Nurse: Evonnie Dawes, RN, Cala Bradford Date/Time (Eastern Time): 06/27/2022 2:03:41 PM Confirm and document reason for call. If symptomatic, describe symptoms. ---Caller states that she is transferring a patient to be triaged. The patient has been experiencing dizziness since last week. He has experienced nausea and vomited last week. The whole room feels like it is spinning. Does the patient have any new or worsening symptoms? ---Yes Will a triage be completed? ---Yes Related visit to physician within the last 2 weeks? ---No Does the PT have any chronic conditions? (i.e. diabetes, asthma, this includes High risk factors for pregnancy, etc.) ---Yes List chronic conditions. ---diabetes, HTN, sleep apnea Is this a behavioral health or substance abuse call? ---No Guidelines Guideline Title Affirmed Question Affirmed Notes Nurse Date/Time (Eastern Time) Dizziness - Vertigo [1] Dizziness (vertigo) present now AND [2] one or more STROKE RISK FACTORS (i.e., hypertension, Daves,  RN, Cala Bradford 06/27/2022 2:05:10 PM PLEASE NOTE: All timestamps contained within this report are represented as [place] Standard Time. CONFIDENTIALTY NOTICE: This fax transmission is intended only for the addressee. It contains information that is legally privileged, confidential or otherwise protected from use or disclosure. If you are not the intended recipient, you are strictly prohibited from reviewing, disclosing, copying using or disseminating any of this information or taking any action in reliance on or regarding this information. If you have received this fax in error, please notify us immediately by telephone so that we can arrange for its return to [place]. Phone: [phone], Toll-Free: [phone], Fax: [phone] Page: 2 of 2 Call Id: [id number] Guidelines Guideline Title Affirmed Question Affirmed Notes Nurse Date/Time Lamount Cohen Time) diabetes, prior stroke/ TIA, heart attack) (Exception: Prior doctor or NP/PA evaluation for this AND no different/ worse than usual.) Disp. Time Lamount Cohen Time) Disposition Final User 06/27/2022 2:14:37 PM Go to ED Now (or PCP triage) Yes Evonnie Dawes, RN, Cala Bradford Final Disposition 06/27/2022 2:14:37 PM Go to ED Now (or PCP triage) Yes Evonnie Dawes, RN, Anders Simmonds Disagree/Comply Comply Caller Understands Yes PreDisposition Call Doctor Care Advice Given Per Guideline GO TO ED NOW (OR PCP TRIAGE): * IF NO PCP (PRIMARY CARE PROVIDER) SECOND-LEVEL TRIAGE: You need to be seen within the next hour. Go to the ED/UCC at _____________ Hospital. Leave as soon as you can. NOTE TO TRIAGER - DRIVING: * Another adult should drive. * Patient should not delay going to the emergency department. * If immediate transportation is not available via car, rideshare (e.g., Lyft, Uber), or taxi, then the patient should be instructed to call [id number]. BRING MEDICINES: * Please bring a list of your current medicines when you go to see the doctor. * It is also a good idea to  bring  the pill bottles too. This will help the doctor to make certain you are taking the right medicines and the right dose. CARE ADVICE given per Dizziness - Vertigo (Adult) guideline. Referrals [facility] - E

## 2022-06-27 NOTE — Telephone Encounter (Signed)
Sending note to Dr Alphonsus Sias who is out of office and [facility] CMA. Per access note pt agreed to go to ED.

## 2022-06-28 ENCOUNTER — Other Ambulatory Visit: Payer: Self-pay

## 2022-06-28 ENCOUNTER — Emergency Department (HOSPITAL_COMMUNITY)
Admission: EM | Admit: 2022-06-28 | Discharge: 2022-06-29 | Disposition: A | Discharge status: Home / Self Care | Payer: BC Managed Care – PPO | Attending: Emergency Medicine | Admitting: Emergency Medicine

## 2022-06-28 ENCOUNTER — Emergency Department (HOSPITAL_COMMUNITY): Payer: BC Managed Care – PPO

## 2022-06-28 ENCOUNTER — Encounter (HOSPITAL_COMMUNITY): Payer: Self-pay | Admitting: Student

## 2022-06-28 DIAGNOSIS — R42 Dizziness and giddiness: Secondary | ICD-10-CM | POA: Diagnosis not present

## 2022-06-28 DIAGNOSIS — Z79899 Other long term (current) drug therapy: Secondary | ICD-10-CM | POA: Diagnosis not present

## 2022-06-28 DIAGNOSIS — R0789 Other chest pain: Secondary | ICD-10-CM | POA: Diagnosis not present

## 2022-06-28 DIAGNOSIS — Z7982 Long term (current) use of aspirin: Secondary | ICD-10-CM | POA: Diagnosis not present

## 2022-06-28 DIAGNOSIS — R7989 Other specified abnormal findings of blood chemistry: Secondary | ICD-10-CM | POA: Diagnosis not present

## 2022-06-28 DIAGNOSIS — I10 Essential (primary) hypertension: Secondary | ICD-10-CM | POA: Diagnosis not present

## 2022-06-28 DIAGNOSIS — R112 Nausea with vomiting, unspecified: Secondary | ICD-10-CM | POA: Insufficient documentation

## 2022-06-28 DIAGNOSIS — Z7984 Long term (current) use of oral hypoglycemic drugs: Secondary | ICD-10-CM | POA: Insufficient documentation

## 2022-06-28 DIAGNOSIS — E1165 Type 2 diabetes mellitus with hyperglycemia: Secondary | ICD-10-CM | POA: Diagnosis not present

## 2022-06-28 DIAGNOSIS — R011 Cardiac murmur, unspecified: Secondary | ICD-10-CM | POA: Diagnosis not present

## 2022-06-28 LAB — CBC WITH DIFFERENTIAL/PLATELET
Abs Immature Granulocytes: 0.02 10*3/uL (ref 0.00–0.07)
Basophils Absolute: 0 10*3/uL (ref 0.0–0.1)
Basophils Relative: 0 %
Eosinophils Absolute: 0.1 10*3/uL (ref 0.0–0.5)
Eosinophils Relative: 1 %
HCT: 41.2 % (ref 39.0–52.0)
Hemoglobin: 13.5 g/dL (ref 13.0–17.0)
Immature Granulocytes: 0 %
Lymphocytes Relative: 27 %
Lymphs Abs: 2 10*3/uL (ref 0.7–4.0)
MCH: 28.4 pg (ref 26.0–34.0)
MCHC: 32.8 g/dL (ref 30.0–36.0)
MCV: 86.6 fL (ref 80.0–100.0)
Monocytes Absolute: 0.5 10*3/uL (ref 0.1–1.0)
Monocytes Relative: 7 %
Neutro Abs: 4.7 10*3/uL (ref 1.7–7.7)
Neutrophils Relative %: 65 %
Platelets: 271 10*3/uL (ref 150–400)
RBC: 4.76 MIL/uL (ref 4.22–5.81)
RDW: 13.2 % (ref 11.5–15.5)
WBC: 7.4 10*3/uL (ref 4.0–10.5)
nRBC: 0 % (ref 0.0–0.2)

## 2022-06-28 LAB — BASIC METABOLIC PANEL
Anion gap: 11 (ref 5–15)
BUN: 21 mg/dL (ref 8–23)
CO2: 24 mmol/L (ref 22–32)
Calcium: 10.4 mg/dL — ABNORMAL HIGH (ref 8.9–10.3)
Chloride: 103 mmol/L (ref 98–111)
Creatinine, Ser: 1.36 mg/dL — ABNORMAL HIGH (ref 0.61–1.24)
GFR, Estimated: 58 mL/min — ABNORMAL LOW (ref >60)
Glucose, Bld: 244 mg/dL — ABNORMAL HIGH (ref 70–99)
Potassium: 4.1 mmol/L (ref 3.5–5.1)
Sodium: 138 mmol/L (ref 135–145)

## 2022-06-28 LAB — URINALYSIS, ROUTINE W REFLEX MICROSCOPIC
Bacteria, UA: NONE SEEN
Bilirubin Urine: NEGATIVE
Glucose, UA: >=500 mg/dL — AB
Hgb urine dipstick: NEGATIVE
Ketones, ur: NEGATIVE mg/dL
Leukocytes,Ua: NEGATIVE
Nitrite: NEGATIVE
Protein, ur: NEGATIVE mg/dL
Specific Gravity, Urine: 1.02 (ref 1.005–1.030)
pH: 5 (ref 5.0–8.0)

## 2022-06-28 LAB — TROPONIN I (HIGH SENSITIVITY): Troponin I (High Sensitivity): 10 ng/L (ref <18)

## 2022-06-28 MED ORDER — SODIUM CHLORIDE 0.9 % IV BOLUS
500.0000 mL | Freq: Once | INTRAVENOUS | Status: AC
  Administered 2022-06-28: 500 mL via INTRAVENOUS

## 2022-06-28 MED ORDER — MECLIZINE HCL 25 MG PO TABS
25.0000 mg | ORAL_TABLET | Freq: Once | ORAL | Status: AC
  Administered 2022-06-29: 25 mg via ORAL
  Filled 2022-06-28 (×2): qty 1

## 2022-06-28 NOTE — Telephone Encounter (Signed)
Left message for pt asking for an update. Asked that he call or send a MyChart message.

## 2022-06-28 NOTE — ED Provider Notes (Signed)
[facility] EMERGENCY DEPARTMENT Provider Note   CSN: [id number] Arrival date & time: 06/28/22  1708     History {Add pertinent medical, surgical, social history, OB history to HPI:1} Chief Complaint  Patient presents with   Dizziness    Randall Pace is a 66 y.o. male with a hx of diabetes mellitus, hypertension, sleep apnea, and migraines who presents to the ED with complaints of dizziness intermittently x 1 week. Patient describes the dizziness as the room spinning with associated nausea, has also had 3 episodes of emesis with this. Dizziness is worse with turning of the head and transitioning from laying to sitting & sitting to standing. No alleviating factors. Does seem to resolve when he stays still but can still occur when laying down. Also notes some chest tightness but states this has been going on for a long time, no acute change. Denies change in vision, numbness, weakness, or syncope.   HPI     Home Medications Prior to Admission medications   Medication Sig Start Date End Date Taking? Authorizing Provider  aspirin 81 MG tablet Take 81 mg by mouth daily.    [provider]  atorvastatin (LIPITOR) 40 MG tablet TAKE 1 TABLET BY MOUTH EVERY DAY 06/06/22   [name], MD  cetirizine (ZYRTEC ALLERGY) 10 MG tablet Take 1 tablet (10 mg total) by mouth daily. 09/18/18   Erin Fulling, MD  cyclobenzaprine (FLEXERIL) 5 MG tablet Take 1 tablet (5 mg total) by mouth 2 (two) times daily as needed for muscle spasms. 03/10/22   Eden Emms, NP  dapagliflozin propanediol (FARXIGA) 5 MG TABS tablet Take 1 tablet (5 mg total) by mouth daily before breakfast. 03/14/22   [name], MD  fenofibrate 54 MG tablet TAKE 1 TABLET(54 MG) BY MOUTH DAILY 06/06/22   [name], MD  fexofenadine (ALLEGRA) 180 MG tablet Take 180 mg by mouth daily.    [provider]  fluticasone (FLONASE) 50 MCG/ACT nasal spray Place 2 sprays into both nostrils  daily. 11/09/21   Mort Sawyers, FNP  glipiZIDE (GLUCOTROL) 10 MG tablet TAKE 1 TABLET (10 MG TOTAL) BY MOUTH TWICE A DAY BEFORE A MEAL 06/13/22   Tillman Abide I, MD  Lancets 30G MISC 1 each by Does not apply route 2 (two) times daily. 10/21/19   Lorre Munroe, NP  metFORMIN (GLUCOPHAGE) 1000 MG tablet TAKE 1 TABLET (1,000 MG TOTAL) BY MOUTH TWICE A DAY WITH FOOD 06/06/22   Tillman Abide I, MD  montelukast (SINGULAIR) 10 MG tablet TAKE 1 TABLET(10 MG) BY MOUTH AT BEDTIME 12/04/21 03/04/22  Mort Sawyers, FNP  omeprazole (PRILOSEC) 20 MG capsule TAKE 1 CAPSULE(20 MG) BY MOUTH DAILY 06/06/22   [name], MD  [facility] ULTRA test strip USE TO TEST TWICE DAILY 05/07/20   Lorre Munroe, NP  sildenafil (VIAGRA) 100 MG tablet TAKE 1/2 TO 1 TABLET(50 TO 100 MG) BY MOUTH DAILY AS NEEDED FOR ERECTILE DYSFUNCTION 03/10/22   [name], MD  triamcinolone ointment (KENALOG) 0.5 % Apply 1 application topically 2 (two) times daily. 01/31/18   Lorre Munroe, NP  valsartan-hydrochlorothiazide (DIOVAN-HCT) 320-25 MG tablet TAKE 1 TABLET BY MOUTH EVERY DAY 06/06/22   [name], MD  verapamil (CALAN-SR) 240 MG CR tablet TAKE 1 TABLET BY MOUTH AT BEDTIME. 06/06/22   [name], MD      Allergies    Patient has no known allergies.    Review of Systems  Review of Systems  Constitutional:  Negative for chills and fever.  Eyes:  Negative for visual disturbance.  Respiratory:  Positive for chest tightness. Negative for shortness of breath.   Cardiovascular:  Negative for leg swelling.  Gastrointestinal:  Positive for nausea and vomiting. Negative for abdominal pain, blood in stool and diarrhea.  Neurological:  Positive for dizziness. Negative for syncope, weakness and numbness.  All other systems reviewed and are negative.   Physical Exam Updated Vital Signs BP (!) 159/99   Pulse 68   Temp 97.8 F (36.6 C) (Oral)   Resp 17   SpO2 98%  Physical Exam Vitals and nursing note reviewed.   Constitutional:      General: He is not in acute distress.    Appearance: Normal appearance. He is not toxic-appearing.  HENT:     Head: Normocephalic and atraumatic.     Mouth/Throat:     Pharynx: Oropharynx is clear. Uvula midline.  Eyes:     General: Vision grossly intact. Gaze aligned appropriately.     Extraocular Movements: Extraocular movements intact.     Conjunctiva/sclera: Conjunctivae normal.     Pupils: Pupils are equal, round, and reactive to light.     Comments: No proptosis.   Cardiovascular:     Rate and Rhythm: Normal rate and regular rhythm.     Pulses:          Radial pulses are 2+ on the right side and 2+ on the left side.  Pulmonary:     Effort: Pulmonary effort is normal.     Breath sounds: Normal breath sounds.  Abdominal:     General: There is no distension.     Palpations: Abdomen is soft.     Tenderness: There is no abdominal tenderness. There is no guarding or rebound.  Musculoskeletal:     Cervical back: Normal range of motion and neck supple. No rigidity.  Skin:    General: Skin is warm and dry.  Neurological:     Mental Status: He is alert.     Comments: Alert. Clear speech. No facial droop. CNIII-XII grossly intact. No vertical or rotational nystagmus noted. Bilateral upper and lower extremities' sensation grossly intact. 5/5 symmetric strength with grip strength and with plantar and dorsi flexion bilaterally . Normal finger to nose bilaterally. Negative pronator drift. Unsteady w/ romberg, however is ambulatory without ataxia.    Psychiatric:        Mood and Affect: Mood normal.        Behavior: Behavior normal.     ED Results / Procedures / Treatments   Labs (all labs ordered are listed, but only abnormal results are displayed) Labs Reviewed  BASIC METABOLIC PANEL - Abnormal; Notable for the following components:      Result Value   Glucose, Bld 244 (*)    Creatinine, Ser 1.36 (*)    Calcium 10.4 (*)    GFR, Estimated 58 (*)    All  other components within normal limits  URINALYSIS, ROUTINE W REFLEX MICROSCOPIC - Abnormal; Notable for the following components:   Color, Urine STRAW (*)    Glucose, UA >=500 (*)    All other components within normal limits  CBC WITH DIFFERENTIAL/PLATELET    EKG None  Radiology No results found.  Procedures Procedures  {Document cardiac monitor, telemetry assessment procedure when appropriate:1}  Medications Ordered in ED Medications - No data to display  ED Course/ Medical Decision Making/ A&P  Medical Decision Making  Patient presents to the ED with complaints of dizziness, this involves an extensive number of treatment options, and is a complaint that carries with it a high risk of complications and morbidity. Nontoxic, vitals ***.   Ddx including but not limited to: ***  Additional history obtained:  Chart/nursing notes reviewed***.  External records viewed including: ***  EKG: ***  Lab Tests:  I viewed & interpreted labs including:  CBC: Unremarkable, no critical anemia BMP: mild increase in creatinine, hyperglycemia however normal bicarb & gap, mild hypercalcemia.  UA: Glucosuria, no ketonuria.   Imaging Studies:  I ordered and viewed the following imaging, agree with radiologist impression:  ***  ED Course:  I ordered medications including *** for ***  ***: RE-EVAL: ***   Based on patient's chief complaint, I considered admission might be necessary, however after reassuring ED workup feel patient is reasonable for discharge.   Critical Interventions: ***  Social determinants: *** Social determinants of health: this may be used for patients who have homelessness, poor access to medical care, need assistance with transition of care for placement, medication or transportation or follow up. You may also use the section for patients who struggle with substance abuse, have dementia, nursing home patients, patients who are severely  disabled or have some degree of mental retardation and are not able to fully participate in their care.   Portions of this note were generated with Scientist, clinical (histocompatibility and immunogenetics). Dictation errors may occur despite best attempts at proofreading.   {Document critical care time when appropriate:1} {Document review of labs and clinical decision tools ie heart score, Chads2Vasc2 etc:1}  {Document your independent review of radiology images, and any outside records:1} {Document your discussion with family members, caretakers, and with consultants:1} {Document social determinants of health affecting pt's care:1} {Document your decision making why or why not admission, treatments were needed:1} Final Clinical Impression(s) / ED Diagnoses Final diagnoses:  None    Rx / DC Orders ED Discharge Orders     None

## 2022-06-28 NOTE — ED Provider Triage Note (Signed)
Emergency Medicine Provider Triage Evaluation Note  Randall Pace , a 66 y.o. male  was evaluated in triage.  Pt complains of intermittent dizziness over the last week.  Brought on by sudden movement, positional changes, or rotations of the head.  Usually lasts 5 minutes at a time and relieves with rest.  Denies any other new accompanying neuro deficit.  Pre-existing peripheral neuropathy of extremities with hx of DMT2.  Occasionally with N/V during episodes.  Denies recent fever, shortness of breath, syncope, or chest pain.  Review of Systems  Positive:  Negative: See above  Physical Exam  BP (!) 166/103 (BP Location: Right Arm)   Pulse 75   Temp 97.8 F (36.6 C) (Oral)   Resp 18   SpO2 97%  Gen:   Awake, no distress   Resp:  Normal effort, CTAB MSK:   Moves extremities without difficulty  Other:  EOMs normal.  PERRLA.  Negative pronator drift.  No facial asymmetry or dysphasia.  No truncal deviation.  Coordination and sensation appear grossly intact.  Medical Decision Making  Medically screening exam initiated at 6:50 PM.  Appropriate orders placed.  Donnel Venuto was informed that the remainder of the evaluation will be completed by another provider, this initial triage assessment does not replace that evaluation, and the importance of remaining in the ED until their evaluation is complete.     Cecil Cobbs, [place] 06/28/22 [phone]

## 2022-06-28 NOTE — ED Notes (Addendum)
Pt taken to xray 

## 2022-06-28 NOTE — ED Triage Notes (Signed)
Pt here for intermittent  dizziness x1 week. Pt denies hx of vertigo, states dizziness is worse w/ movement, he feels like the room is spinning. Pt had N/V when it first started but has now resolved. Pt denies blurry vision, hx of HTN and on medication.

## 2022-06-29 ENCOUNTER — Emergency Department (HOSPITAL_COMMUNITY): Payer: BC Managed Care – PPO

## 2022-06-29 DIAGNOSIS — R42 Dizziness and giddiness: Secondary | ICD-10-CM | POA: Diagnosis not present

## 2022-06-29 LAB — TROPONIN I (HIGH SENSITIVITY): Troponin I (High Sensitivity): 11 ng/L (ref <18)

## 2022-06-29 MED ORDER — MECLIZINE HCL 25 MG PO TABS: 25.0000 mg | ORAL_TABLET | Freq: Three times a day (TID) | ORAL | 0 refills | Status: DC | PRN

## 2022-06-29 MED ORDER — LORAZEPAM 2 MG/ML IJ SOLN
0.5000 mg | Freq: Once | INTRAMUSCULAR | Status: AC
Start: 2022-06-29 — End: 2022-06-29
  Administered 2022-06-29: 0.5 mg via INTRAVENOUS
  Filled 2022-06-29: qty 1

## 2022-06-29 MED ORDER — DIAZEPAM 2 MG PO TABS: 2.0000 mg | ORAL_TABLET | Freq: Two times a day (BID) | ORAL | 0 refills | Status: DC | PRN

## 2022-06-29 NOTE — ED Notes (Signed)
Patient verbalizes understanding of discharge instructions. Opportunity for questioning and answers were provided. Armband removed by staff, pt discharged from ED ambulatory.   

## 2022-06-29 NOTE — ED Notes (Signed)
Pt sat up in bed and almost immediately fell back d/t dizziness - after sitting at edge of bed for a few minutes pt was able to ambulate in the hallway with little to no assistance

## 2022-06-29 NOTE — Discharge Instructions (Addendum)
You were seen in the emergency department today for dizziness.  Your work-up was overall reassuring.  Your blood sugar was elevated as was your creatinine which looks at your kidney function very mildly, we would like you to have this rechecked by your primary care provider.  Blood pressure was also elevated this should be rechecked as well.  Your MRI did not show an acute stroke.  We suspect that you have peripheral vertigo.  We are sending you home with meclizine to take every 8 hours as needed for dizziness.  If the meclizine is not relieving your dizziness you may take 1 tablet of Valium every 12 hours as needed.  These medications can make you sleepy.  Do not drive or operate heavy machinery after taking them.  Do not drink alcohol with these medicines.  Do not take other sedating medications.  We have prescribed you new medication(s) today. Discuss the medications prescribed today with your pharmacist as they can have adverse effects and interactions with your other medicines including over the counter and prescribed medications. Seek medical evaluation if you start to experience new or abnormal symptoms after taking one of these medicines, seek care immediately if you start to experience difficulty breathing, feeling of your throat closing, facial swelling, or rash as these could be indications of a more serious allergic reaction  Please follow-up with your primary care provider as well as the ear nose and throat doctor soon as possible.  Return to the emergency department for any new or worsening symptoms including but not limited to persistent dizziness that does not go away, severe headache, new or worsening pain, change in your vision, numbness, weakness, inability to walk, passing out, or any other concerns.

## 2022-06-29 NOTE — ED Notes (Signed)
Patient transported to MRI 

## 2022-06-30 NOTE — Telephone Encounter (Signed)
Spoke to pt. He did go to [facility] ER. He has an appt 07-05-22. Was diagnosed with Vertigo.

## 2022-07-05 ENCOUNTER — Encounter: Payer: Self-pay | Admitting: Internal Medicine

## 2022-07-05 ENCOUNTER — Telehealth (INDEPENDENT_AMBULATORY_CARE_PROVIDER_SITE_OTHER): Payer: BC Managed Care – PPO | Admitting: Internal Medicine

## 2022-07-05 DIAGNOSIS — R42 Dizziness and giddiness: Secondary | ICD-10-CM | POA: Diagnosis not present

## 2022-07-05 MED ORDER — MECLIZINE HCL 25 MG PO TABS: 25.0000 mg | ORAL_TABLET | Freq: Three times a day (TID) | ORAL | 1 refills | Status: DC | PRN

## 2022-07-05 NOTE — Assessment & Plan Note (Signed)
Persistent symptoms MRI brain was negative Was referred to ENT---thinks he has appt next week Continue meclizine 25 tid Try the diazepam at bedtime Recommended Epley maneuver as well

## 2022-07-05 NOTE — Progress Notes (Signed)
Subjective:    Patient ID: Randall Pace, male    DOB: [birth year], 66 y.o.   MRN: [id number]  HPI Video virtual visit for persistent vertigo Identification done Reviewed limitations and billing and he gave consent Participants---patient in his home and I am in my office  Went to ER due to vertigo Was pretty bad----the room will just start spinning and it "throws me back" Especially hard getting out of bed First came on about 2 weeks ago Some improvement--but then worsened again today Just closes his eyes till spinning stops Using the meclizine 3 times a day--hasn't used the diazepam  Some trouble walking when vertigo is bad--has to hold on Will keep feet planted in shower--for stability  Current Outpatient Medications on File Prior to Visit  Medication Sig Dispense Refill   aspirin 81 MG tablet Take 81 mg by mouth daily.     atorvastatin (LIPITOR) 40 MG tablet TAKE 1 TABLET BY MOUTH EVERY DAY 30 tablet 0   cetirizine (ZYRTEC ALLERGY) 10 MG tablet Take 1 tablet (10 mg total) by mouth daily. 30 tablet 6   cyclobenzaprine (FLEXERIL) 5 MG tablet Take 1 tablet (5 mg total) by mouth 2 (two) times daily as needed for muscle spasms. 15 tablet 0   dapagliflozin propanediol (FARXIGA) 5 MG TABS tablet Take 1 tablet (5 mg total) by mouth daily before breakfast. 90 tablet 3   fenofibrate 54 MG tablet TAKE 1 TABLET(54 MG) BY MOUTH DAILY 30 tablet 0   fexofenadine (ALLEGRA) 180 MG tablet Take 180 mg by mouth daily.     fluticasone (FLONASE) 50 MCG/ACT nasal spray Place 2 sprays into both nostrils daily. 16 g 6   glipiZIDE (GLUCOTROL) 10 MG tablet TAKE 1 TABLET (10 MG TOTAL) BY MOUTH TWICE A DAY BEFORE A MEAL 180 tablet 0   Lancets 30G MISC 1 each by Does not apply route 2 (two) times daily. 200 each 1   meclizine (ANTIVERT) 25 MG tablet Take 1 tablet (25 mg total) by mouth 3 (three) times daily as needed for dizziness. 12 tablet 0   metFORMIN (GLUCOPHAGE) 1000 MG tablet TAKE 1 TABLET (1,000 MG  TOTAL) BY MOUTH TWICE A DAY WITH FOOD 60 tablet 0   omeprazole (PRILOSEC) 20 MG capsule TAKE 1 CAPSULE(20 MG) BY MOUTH DAILY 30 capsule 0   ONETOUCH ULTRA test strip USE TO TEST TWICE DAILY 200 strip 1   sildenafil (VIAGRA) 100 MG tablet TAKE 1/2 TO 1 TABLET(50 TO 100 MG) BY MOUTH DAILY AS NEEDED FOR ERECTILE DYSFUNCTION 10 tablet 11   triamcinolone ointment (KENALOG) 0.5 % Apply 1 application topically 2 (two) times daily. 30 g 0   valsartan-hydrochlorothiazide (DIOVAN-HCT) 320-25 MG tablet TAKE 1 TABLET BY MOUTH EVERY DAY 30 tablet 0   verapamil (CALAN-SR) 240 MG CR tablet TAKE 1 TABLET BY MOUTH AT BEDTIME. 30 tablet 0   montelukast (SINGULAIR) 10 MG tablet TAKE 1 TABLET(10 MG) BY MOUTH AT BEDTIME 90 tablet 1   No current facility-administered medications on file prior to visit.    No Known Allergies  Past Medical History:  Diagnosis Date   Arthritis    low back   Diabetes mellitus    GERD (gastroesophageal reflux disease)    History of chicken pox    Hypertension    Migraine    Patellofemoral pain syndrome    Sleep apnea     Past Surgical History:  Procedure Laterality Date   NASAL SINUS SURGERY     palatoplasts  TONSILLECTOMY     uvoloplasty      Family History  Problem Relation Age of Onset   Cancer Father        prostate    Social History   Socioeconomic History   Marital status: Married    Spouse name: Not on file   Number of children: 2   Years of education: Not on file   Highest education level: Not on file  Occupational History   Occupation: Investment banker, corporate: [employer]  Tobacco Use   Smoking status: Never    Passive exposure: Past   Smokeless tobacco: Never  Vaping Use   Vaping Use: Never used  Substance and Sexual Activity   Alcohol use: Yes    Comment: occasional   Drug use: No   Sexual activity: Yes  Other Topics Concern   Not on file  Social History Narrative   Not on file   Social Determinants of Health   Financial Resource  Strain: Not on file  Food Insecurity: Not on file  Transportation Needs: Not on file  Physical Activity: Not on file  Stress: Not on file  Social Connections: Not on file  Intimate Partner Violence: Not on file   Review of Systems No tinnitus Chronic hearing problems---no recent change Wife and he are trying to make some changes to eating,etc. Not checking sugars No recent travel    Objective:   Physical Exam Constitutional:      Appearance: Normal appearance.  Neurological:     Mental Status: He is alert.            Assessment & Plan:

## 2022-07-06 ENCOUNTER — Ambulatory Visit (INDEPENDENT_AMBULATORY_CARE_PROVIDER_SITE_OTHER): Payer: BC Managed Care – PPO

## 2022-07-06 ENCOUNTER — Ambulatory Visit (INDEPENDENT_AMBULATORY_CARE_PROVIDER_SITE_OTHER): Payer: BC Managed Care – PPO | Admitting: Podiatry

## 2022-07-06 DIAGNOSIS — M722 Plantar fascial fibromatosis: Secondary | ICD-10-CM

## 2022-07-06 DIAGNOSIS — E1142 Type 2 diabetes mellitus with diabetic polyneuropathy: Secondary | ICD-10-CM | POA: Diagnosis not present

## 2022-07-06 DIAGNOSIS — I739 Peripheral vascular disease, unspecified: Secondary | ICD-10-CM | POA: Diagnosis not present

## 2022-07-06 MED ORDER — MELOXICAM 7.5 MG PO TABS: 7.5000 mg | ORAL_TABLET | Freq: Every day | ORAL | 0 refills | Status: DC | PRN

## 2022-07-06 NOTE — Patient Instructions (Signed)
For shoes I would recommend going to [facility] to get fitted for shoes  For instructions on how to put on your Plantar Fascial Brace, please visit [email or web address]  Plantar Fasciitis (Heel Spur Syndrome) with Rehab The plantar fascia is a fibrous, ligament-like, soft-tissue structure that spans the bottom of the foot. Plantar fasciitis is a condition that causes pain in the foot due to inflammation of the tissue. SYMPTOMS  Pain and tenderness on the underneath side of the foot. Pain that worsens with standing or walking. CAUSES  Plantar fasciitis is caused by irritation and injury to the plantar fascia on the underneath side of the foot. Common mechanisms of injury include: Direct trauma to bottom of the foot. Damage to a small nerve that runs under the foot where the main fascia attaches to the heel bone. Stress placed on the plantar fascia due to bone spurs. RISK INCREASES WITH:  Activities that place stress on the plantar fascia (running, jumping, pivoting, or cutting). Poor strength and flexibility. Improperly fitted shoes. Tight calf muscles. Flat feet. Failure to warm-up properly before activity. Obesity. PREVENTION Warm up and stretch properly before activity. Allow for adequate recovery between workouts. Maintain physical fitness: Strength, flexibility, and endurance. Cardiovascular fitness. Maintain a health body weight. Avoid stress on the plantar fascia. Wear properly fitted shoes, including arch supports for individuals who have flat feet.  PROGNOSIS  If treated properly, then the symptoms of plantar fasciitis usually resolve without surgery. However, occasionally surgery is necessary.  RELATED COMPLICATIONS  Recurrent symptoms that may result in a chronic condition. Problems of the lower back that are caused by compensating for the injury, such as limping. Pain or weakness of the foot during push-off following surgery. Chronic inflammation, scarring, and  partial or complete fascia tear, occurring more often from repeated injections.  TREATMENT  Treatment initially involves the use of ice and medication to help reduce pain and inflammation. The use of strengthening and stretching exercises may help reduce pain with activity, especially stretches of the Achilles tendon. These exercises may be performed at home or with a therapist. Your caregiver may recommend that you use heel cups of arch supports to help reduce stress on the plantar fascia. Occasionally, corticosteroid injections are given to reduce inflammation. If symptoms persist for greater than 6 months despite non-surgical (conservative), then surgery may be recommended.   MEDICATION  If pain medication is necessary, then nonsteroidal anti-inflammatory medications, such as aspirin and ibuprofen, or other minor pain relievers, such as acetaminophen, are often recommended. Do not take pain medication within 7 days before surgery. Prescription pain relievers may be given if deemed necessary by your caregiver. Use only as directed and only as much as you need. Corticosteroid injections may be given by your caregiver. These injections should be reserved for the most serious cases, because they may only be given a certain number of times.  HEAT AND COLD Cold treatment (icing) relieves pain and reduces inflammation. Cold treatment should be applied for 10 to 15 minutes every 2 to 3 hours for inflammation and pain and immediately after any activity that aggravates your symptoms. Use ice packs or massage the area with a piece of ice (ice massage). Heat treatment may be used prior to performing the stretching and strengthening activities prescribed by your caregiver, physical therapist, or athletic trainer. Use a heat pack or soak the injury in warm water.  SEEK IMMEDIATE MEDICAL CARE IF: Treatment seems to offer no benefit, or the condition worsens. Any medications produce adverse  side  effects.  EXERCISES- RANGE OF MOTION (ROM) AND STRETCHING EXERCISES - Plantar Fasciitis (Heel Spur Syndrome) These exercises may help you when beginning to rehabilitate your injury. Your symptoms may resolve with or without further involvement from your physician, physical therapist or athletic trainer. While completing these exercises, remember:  Restoring tissue flexibility helps normal motion to return to the joints. This allows healthier, less painful movement and activity. An effective stretch should be held for at least 30 seconds. A stretch should never be painful. You should only feel a gentle lengthening or release in the stretched tissue.  RANGE OF MOTION - Toe Extension, Flexion Sit with your right / left leg crossed over your opposite knee. Grasp your toes and gently pull them back toward the top of your foot. You should feel a stretch on the bottom of your toes and/or foot. Hold this stretch for 10 seconds. Now, gently pull your toes toward the bottom of your foot. You should feel a stretch on the top of your toes and or foot. Hold this stretch for 10 seconds. Repeat  times. Complete this stretch 3 times per day.   RANGE OF MOTION - Ankle Dorsiflexion, Active Assisted Remove shoes and sit on a chair that is preferably not on a carpeted surface. Place right / left foot under knee. Extend your opposite leg for support. Keeping your heel down, slide your right / left foot back toward the chair until you feel a stretch at your ankle or calf. If you do not feel a stretch, slide your bottom forward to the edge of the chair, while still keeping your heel down. Hold this stretch for 10 seconds. Repeat 3 times. Complete this stretch 2 times per day.   STRETCH  Gastroc, Standing Place hands on wall. Extend right / left leg, keeping the front knee somewhat bent. Slightly point your toes inward on your back foot. Keeping your right / left heel on the floor and your knee straight, shift  your weight toward the wall, not allowing your back to arch. You should feel a gentle stretch in the right / left calf. Hold this position for 10 seconds. Repeat 3 times. Complete this stretch 2 times per day.  STRETCH  Soleus, Standing Place hands on wall. Extend right / left leg, keeping the other knee somewhat bent. Slightly point your toes inward on your back foot. Keep your right / left heel on the floor, bend your back knee, and slightly shift your weight over the back leg so that you feel a gentle stretch deep in your back calf. Hold this position for 10 seconds. Repeat 3 times. Complete this stretch 2 times per day.  STRETCH  Gastrocsoleus, Standing  Note: This exercise can place a lot of stress on your foot and ankle. Please complete this exercise only if specifically instructed by your caregiver.  Place the ball of your right / left foot on a step, keeping your other foot firmly on the same step. Hold on to the wall or a rail for balance. Slowly lift your other foot, allowing your body weight to press your heel down over the edge of the step. You should feel a stretch in your right / left calf. Hold this position for 10 seconds. Repeat this exercise with a slight bend in your right / left knee. Repeat 3 times. Complete this stretch 2 times per day.   STRENGTHENING EXERCISES - Plantar Fasciitis (Heel Spur Syndrome)  These exercises may help you when beginning  to rehabilitate your injury. They may resolve your symptoms with or without further involvement from your physician, physical therapist or athletic trainer. While completing these exercises, remember:  Muscles can gain both the endurance and the strength needed for everyday activities through controlled exercises. Complete these exercises as instructed by your physician, physical therapist or athletic trainer. Progress the resistance and repetitions only as guided.  STRENGTH - Towel Curls Sit in a chair positioned on a  non-carpeted surface. Place your foot on a towel, keeping your heel on the floor. Pull the towel toward your heel by only curling your toes. Keep your heel on the floor. Repeat 3 times. Complete this exercise 2 times per day.  STRENGTH - Ankle Inversion Secure one end of a rubber exercise band/tubing to a fixed object (table, pole). Loop the other end around your foot just before your toes. Place your fists between your knees. This will focus your strengthening at your ankle. Slowly, pull your big toe up and in, making sure the band/tubing is positioned to resist the entire motion. Hold this position for 10 seconds. Have your muscles resist the band/tubing as it slowly pulls your foot back to the starting position. Repeat 3 times. Complete this exercises 2 times per day.  Document Released: 10/16/2005 Document Revised: 01/08/2012 Document Reviewed: 01/28/2009 [facility] Patient Information 2014 [place], [place]. Meloxicam Tablets What is this medication? MELOXICAM (mel OX i cam) treats mild to moderate pain, inflammation, or arthritis. It works by decreasing inflammation. It belongs to a group of medications called NSAIDs. This medicine may be used for other purposes; ask your health care provider or pharmacist if you have questions. COMMON BRAND NAME(S): Mobic What should I tell my care team before I take this medication? They need to know if you have any of these conditions: Asthma (lung or breathing disease) Bleeding disorder Coronary artery bypass graft (CABG) within the past 2 weeks Dehydration Heart attack Heart disease Heart failure High blood pressure If you often drink alcohol Kidney disease Liver disease Smoke tobacco cigarettes Stomach bleeding Stomach ulcers, other stomach or intestine problems Take medications that treat or prevent blood clots Taking other steroids like dexamethasone or prednisone An unusual or allergic reaction to meloxicam, other medications, foods,  dyes, or preservatives Pregnant or trying to get pregnant Breast-feeding How should I use this medication? Take this medication by mouth. Take it as directed on the prescription label at the same time every day. You can take it with or without food. If it upsets your stomach, take it with food. Do not use it more often than directed. There may be unused or extra doses in the bottle after you finish your treatment. Talk to your care team if you have questions about your dose. A special MedGuide will be given to you by the pharmacist with each prescription and refill. Be sure to read this information carefully each time. Talk to your care team about the use of this medication in children. Special care may be needed. Patients over 56 years of age may have a stronger reaction and need a smaller dose. Overdosage: If you think you have taken too much of this medicine contact a poison control center or emergency room at once. NOTE: This medicine is only for you. Do not share this medicine with others. What if I miss a dose? If you miss a dose, take it as soon as you can. If it is almost time for your next dose, take only that dose. Do not take  double or extra doses. What may interact with this medication? Do not take this medication with any of the following: Cidofovir Ketorolac This medication may also interact with the following: Aspirin and aspirin-like medications Certain medications for blood pressure, heart disease, irregular heart beat Certain medications for depression, anxiety, or psychotic disturbances Certain medications that treat or prevent blood clots like warfarin, enoxaparin, dalteparin, apixaban, dabigatran, rivaroxaban Cyclosporine Diuretics Fluconazole Lithium Methotrexate Other NSAIDs, medications for pain and inflammation, like ibuprofen and naproxen Pemetrexed This list may not describe all possible interactions. Give your health care provider a list of all the medicines,  herbs, non-prescription drugs, or dietary supplements you use. Also tell them if you smoke, drink alcohol, or use illegal drugs. Some items may interact with your medicine. What should I watch for while using this medication? Visit your care team for regular checks on your progress. Tell your care team if your symptoms do not start to get better or if they get worse. Do not take other medications that contain aspirin, ibuprofen, or naproxen with this medication. Side effects such as stomach upset, nausea, or ulcers may be more likely to occur. Many non-prescription medications contain aspirin, ibuprofen, or naproxen. Always read labels carefully. This medication can cause serious ulcers and bleeding in the stomach. It can happen with no warning. Smoking, drinking alcohol, older age, and poor health can also increase risks. Call your care team right away if you have stomach pain or blood in your vomit or stool. This medication does not prevent a heart attack or stroke. This medication may increase the chance of a heart attack or stroke. The chance may increase the longer you use this medication or if you have heart disease. If you take aspirin to prevent a heart attack or stroke, talk to your care team about using this medication. Alcohol may interfere with the effect of this medication. Avoid alcoholic drinks. This medication may cause serious skin reactions. They can happen weeks to months after starting the medication. Contact your care team right away if you notice fevers or flu-like symptoms with a rash. The rash may be red or purple and then turn into blisters or peeling of the skin. Or, you might notice a red rash with swelling of the face, lips or lymph nodes in your neck or under your arms. Talk to your care team if you are pregnant before taking this medication. Taking this medication between weeks 20 and 30 of pregnancy may harm your unborn baby. Your care team will monitor you closely if you need  to take it. After 30 weeks of pregnancy, do not take this medication. You may get drowsy or dizzy. Do not drive, use machinery, or do anything that needs mental alertness until you know how this medication affects you. Do not stand up or sit up quickly, especially if you are an older patient. This reduces the risk of dizzy or fainting spells. Be careful brushing or flossing your teeth or using a toothpick because you may get an infection or bleed more easily. If you have any dental work done, tell your dentist you are receiving this medication. This medication may make it more difficult to get pregnant. Talk to your care team if you are concerned about your fertility. What side effects may I notice from receiving this medication? Side effects that you should report to your care team as soon as possible: Allergic reactions--skin rash, itching, hives, swelling of the face, lips, tongue, or throat Bleeding--bloody or black, tar-like  stools, vomiting blood or brown material that looks like coffee grounds, red or dark brown urine, small red or purple spots on skin, unusual bruising or bleeding Heart attack--pain or tightness in the chest, shoulders, arms, or jaw, nausea, shortness of breath, cold or clammy skin, feeling faint or lightheaded Heart failure--shortness of breath, swelling of ankles, feet, or hands, sudden weight gain, unusual weakness or fatigue Increase in blood pressure Kidney injury--decrease in the amount of urine, swelling of the ankles, hands, or feet Liver injury--right upper belly pain, loss of appetite, nausea, light-colored stool, dark yellow or brown urine, yellowing skin or eyes, unusual weakness or fatigue Rash, fever, and swollen lymph nodes Redness, blistering, peeling, or loosening of the skin, including inside the mouth Stroke--sudden numbness or weakness of the face, arm, or leg, trouble speaking, confusion, trouble walking, loss of balance or coordination, dizziness, severe  headache, change in vision Side effects that usually do not require medical attention (report to your care team if they continue or are bothersome): Diarrhea Nausea Upset stomach This list may not describe all possible side effects. Call your doctor for medical advice about side effects. You may report side effects to FDA at 1-800-FDA-1088. Where should I keep my medication? Keep out of the reach of children and pets. Store at room temperature between 20 and 25 degrees C (68 and 77 degrees F). Protect from moisture. Keep the container tightly closed. Get rid of any unused medication after the expiration date. To get rid of medications that are no longer needed or have expired: Take the medication to a medication take-back program. Check with your pharmacy or law enforcement to find a location. If you cannot return the medication, check the label or package insert to see if the medication should be thrown out in the garbage or flushed down the toilet. If you are not sure, ask your care team. If it is safe to put it in the trash, empty the medication out of the container. Mix the medication with cat litter, dirt, coffee grounds, or other unwanted substance. Seal the mixture in a bag or container. Put it in the trash. NOTE: This sheet is a summary. It may not cover all possible information. If you have questions about this medicine, talk to your doctor, pharmacist, or health care provider.  2023 Elsevier/Gold Standard (2007-12-07 00:00:00)

## 2022-07-06 NOTE — Progress Notes (Unsigned)
Subjective:   Patient ID: Randall Pace, male   DOB: 66 y.o.   MRN: [id number]   HPI Chief Complaint  Patient presents with   Diabetes    Patient came in today for left heel and arch pain and diabetic foot care, A1c-11.8 BG- Doesn't take it, the left heel pain started 3 weeks, Rate of pain 9 out of 10, Morning pain, after walking, and after sitting for a long time,    66 year old male presents for above concerns.  He does state that he will present ongoing for about 3 weeks.  No injuries that he reports.  No recent treatment.  States that as the day goes on it feels better but still notices the discomfort.  No swelling.  Other concerns.  Review of Systems  All other systems reviewed and are negative.  Past Medical History:  Diagnosis Date   Arthritis    low back   Diabetes mellitus    GERD (gastroesophageal reflux disease)    History of chicken pox    Hypertension    Migraine    Patellofemoral pain syndrome    Sleep apnea     Past Surgical History:  Procedure Laterality Date   NASAL SINUS SURGERY     palatoplasts     TONSILLECTOMY     uvoloplasty       Current Outpatient Medications:    meloxicam (MOBIC) 7.5 MG tablet, Take 1 tablet (7.5 mg total) by mouth daily as needed for pain., Disp: 14 tablet, Rfl: 0   aspirin 81 MG tablet, Take 81 mg by mouth daily., Disp: , Rfl:    atorvastatin (LIPITOR) 40 MG tablet, TAKE 1 TABLET BY MOUTH EVERY DAY, Disp: 30 tablet, Rfl: 0   cetirizine (ZYRTEC ALLERGY) 10 MG tablet, Take 1 tablet (10 mg total) by mouth daily., Disp: 30 tablet, Rfl: 6   cyclobenzaprine (FLEXERIL) 5 MG tablet, Take 1 tablet (5 mg total) by mouth 2 (two) times daily as needed for muscle spasms., Disp: 15 tablet, Rfl: 0   dapagliflozin propanediol (FARXIGA) 5 MG TABS tablet, Take 1 tablet (5 mg total) by mouth daily before breakfast., Disp: 90 tablet, Rfl: 3   fenofibrate 54 MG tablet, TAKE 1 TABLET(54 MG) BY MOUTH DAILY, Disp: 30 tablet, Rfl: 0   fexofenadine  (ALLEGRA) 180 MG tablet, Take 180 mg by mouth daily., Disp: , Rfl:    fluticasone (FLONASE) 50 MCG/ACT nasal spray, Place 2 sprays into both nostrils daily., Disp: 16 g, Rfl: 6   glipiZIDE (GLUCOTROL) 10 MG tablet, TAKE 1 TABLET (10 MG TOTAL) BY MOUTH TWICE A DAY BEFORE A MEAL, Disp: 180 tablet, Rfl: 0   Lancets 30G MISC, 1 each by Does not apply route 2 (two) times daily., Disp: 200 each, Rfl: 1   meclizine (ANTIVERT) 25 MG tablet, Take 1 tablet (25 mg total) by mouth 3 (three) times daily as needed for dizziness., Disp: 90 tablet, Rfl: 1   metFORMIN (GLUCOPHAGE) 1000 MG tablet, TAKE 1 TABLET (1,000 MG TOTAL) BY MOUTH TWICE A DAY WITH FOOD, Disp: 60 tablet, Rfl: 0   montelukast (SINGULAIR) 10 MG tablet, TAKE 1 TABLET(10 MG) BY MOUTH AT BEDTIME, Disp: 90 tablet, Rfl: 1   omeprazole (PRILOSEC) 20 MG capsule, TAKE 1 CAPSULE(20 MG) BY MOUTH DAILY, Disp: 30 capsule, Rfl: 0   ONETOUCH ULTRA test strip, USE TO TEST TWICE DAILY, Disp: 200 strip, Rfl: 1   sildenafil (VIAGRA) 100 MG tablet, TAKE 1/2 TO 1 TABLET(50 TO 100 MG) BY MOUTH DAILY  AS NEEDED FOR ERECTILE DYSFUNCTION, Disp: 10 tablet, Rfl: 11   triamcinolone ointment (KENALOG) 0.5 %, Apply 1 application topically 2 (two) times daily., Disp: 30 g, Rfl: 0   valsartan-hydrochlorothiazide (DIOVAN-HCT) 320-25 MG tablet, TAKE 1 TABLET BY MOUTH EVERY DAY, Disp: 30 tablet, Rfl: 0   verapamil (CALAN-SR) 240 MG CR tablet, TAKE 1 TABLET BY MOUTH AT BEDTIME., Disp: 30 tablet, Rfl: 0  No Known Allergies         Objective:  Physical Exam  General: AAO x3, NAD  Dermatological: Skin is warm, dry and supple bilateral.  No open lesions.  Vascular: DP 2/4, PT decreased.  There is no pain with calf compression, swelling, warmth, erythema.   Neruologic: Sensation mildly decreased with Semmes Weinstein monofilament  Musculoskeletal: There is tenderness palpation along the plantar medial tubercle of the calcaneus and insertion of the plantar fascia on the left  foot.  Mild discomfort the arch of the foot there is no pain with lateral compression of calcaneus.  No pain with Achilles tendon.  No edema, erythema.  No other areas of discomfort noted at this time.  Gait: Unassisted, Nonantalgic.       Assessment:   66 year old male with left heel pain, Plantar fasciitis; diabetic foot exam     Plan:  -Treatment options discussed including all alternatives, risks, and complications. -Etiology of symptoms were discussed -X-rays were obtained and reviewed with the patient.  3 views of the left foot were obtained.  No evidence of acute fracture.  No significant calcaneal spurring present. -We discussed shoe modifications good arch support. -Plantar fascia brace dispensed to help support and stabilize the plantar fascia. -If needed consider steroid injection -Prescribed mobic. Discussed side effects of the medication and directed to stop if any are to occur and call the office.  -From a diabetes standpoint discussed the importance of daily foot inspection. -ABI ordered to assure adequate circulation  Vivi Barrack DPM

## 2022-07-18 DIAGNOSIS — H9042 Sensorineural hearing loss, unilateral, left ear, with unrestricted hearing on the contralateral side: Secondary | ICD-10-CM | POA: Diagnosis not present

## 2022-07-18 DIAGNOSIS — R42 Dizziness and giddiness: Secondary | ICD-10-CM | POA: Diagnosis not present

## 2022-07-18 DIAGNOSIS — H8111 Benign paroxysmal vertigo, right ear: Secondary | ICD-10-CM | POA: Diagnosis not present

## 2022-07-23 ENCOUNTER — Other Ambulatory Visit: Payer: Self-pay | Admitting: Internal Medicine

## 2022-08-16 DIAGNOSIS — H9042 Sensorineural hearing loss, unilateral, left ear, with unrestricted hearing on the contralateral side: Secondary | ICD-10-CM | POA: Diagnosis not present

## 2022-08-16 DIAGNOSIS — R0982 Postnasal drip: Secondary | ICD-10-CM | POA: Diagnosis not present

## 2022-08-16 DIAGNOSIS — J310 Chronic rhinitis: Secondary | ICD-10-CM | POA: Diagnosis not present

## 2022-08-16 DIAGNOSIS — J343 Hypertrophy of nasal turbinates: Secondary | ICD-10-CM | POA: Diagnosis not present

## 2022-08-16 DIAGNOSIS — R42 Dizziness and giddiness: Secondary | ICD-10-CM | POA: Diagnosis not present

## 2022-08-16 DIAGNOSIS — H8111 Benign paroxysmal vertigo, right ear: Secondary | ICD-10-CM | POA: Diagnosis not present

## 2022-09-04 ENCOUNTER — Ambulatory Visit: Payer: BC Managed Care – PPO | Admitting: Internal Medicine

## 2022-09-13 ENCOUNTER — Other Ambulatory Visit: Payer: Self-pay | Admitting: Internal Medicine

## 2022-09-13 DIAGNOSIS — I10 Essential (primary) hypertension: Secondary | ICD-10-CM

## 2022-09-13 NOTE — Telephone Encounter (Signed)
Spoke with patient and he stated he has a lot going on right now. He will call at a later date to schedule.

## 2022-09-13 NOTE — Telephone Encounter (Signed)
Please schedule 6 month follow-up with Dr Alphonsus Sias. Thanks.

## 2022-10-04 DIAGNOSIS — E113293 Type 2 diabetes mellitus with mild nonproliferative diabetic retinopathy without macular edema, bilateral: Secondary | ICD-10-CM | POA: Diagnosis not present

## 2022-10-04 LAB — HM DIABETES EYE EXAM

## 2022-10-08 ENCOUNTER — Other Ambulatory Visit: Payer: Self-pay | Admitting: Internal Medicine

## 2022-10-16 ENCOUNTER — Other Ambulatory Visit: Payer: Self-pay | Admitting: Internal Medicine

## 2022-10-16 ENCOUNTER — Encounter: Payer: Self-pay | Admitting: Internal Medicine

## 2022-10-16 DIAGNOSIS — I10 Essential (primary) hypertension: Secondary | ICD-10-CM

## 2022-10-20 DIAGNOSIS — R42 Dizziness and giddiness: Secondary | ICD-10-CM | POA: Diagnosis not present

## 2022-10-20 DIAGNOSIS — J343 Hypertrophy of nasal turbinates: Secondary | ICD-10-CM | POA: Diagnosis not present

## 2022-10-20 DIAGNOSIS — R053 Chronic cough: Secondary | ICD-10-CM | POA: Diagnosis not present

## 2022-10-20 DIAGNOSIS — J310 Chronic rhinitis: Secondary | ICD-10-CM | POA: Diagnosis not present

## 2022-10-20 DIAGNOSIS — H8111 Benign paroxysmal vertigo, right ear: Secondary | ICD-10-CM | POA: Diagnosis not present

## 2022-10-20 DIAGNOSIS — R0982 Postnasal drip: Secondary | ICD-10-CM | POA: Diagnosis not present

## 2022-11-23 DIAGNOSIS — R42 Dizziness and giddiness: Secondary | ICD-10-CM | POA: Diagnosis not present

## 2022-11-23 DIAGNOSIS — R070 Pain in throat: Secondary | ICD-10-CM | POA: Diagnosis not present

## 2022-11-23 DIAGNOSIS — R053 Chronic cough: Secondary | ICD-10-CM | POA: Diagnosis not present

## 2022-11-24 ENCOUNTER — Other Ambulatory Visit: Payer: Self-pay | Admitting: Internal Medicine

## 2022-11-24 DIAGNOSIS — I10 Essential (primary) hypertension: Secondary | ICD-10-CM

## 2022-11-24 NOTE — Telephone Encounter (Signed)
We filled his medications for 30 days. Pt is past due for a Medicare Wellness Exam. Please contact pt to schedule. Thank you.

## 2022-11-30 ENCOUNTER — Ambulatory Visit (INDEPENDENT_AMBULATORY_CARE_PROVIDER_SITE_OTHER): Payer: BC Managed Care – PPO | Admitting: Internal Medicine

## 2022-11-30 ENCOUNTER — Encounter: Payer: Self-pay | Admitting: Internal Medicine

## 2022-11-30 VITALS — BP 180/96 | HR 84 | Temp 97.5°F | Ht 67.0 in | Wt 206.0 lb

## 2022-11-30 DIAGNOSIS — E1140 Type 2 diabetes mellitus with diabetic neuropathy, unspecified: Secondary | ICD-10-CM

## 2022-11-30 DIAGNOSIS — Z125 Encounter for screening for malignant neoplasm of prostate: Secondary | ICD-10-CM

## 2022-11-30 DIAGNOSIS — N1831 Chronic kidney disease, stage 3a: Secondary | ICD-10-CM | POA: Insufficient documentation

## 2022-11-30 DIAGNOSIS — I10 Essential (primary) hypertension: Secondary | ICD-10-CM

## 2022-11-30 DIAGNOSIS — Z0000 Encounter for general adult medical examination without abnormal findings: Secondary | ICD-10-CM | POA: Diagnosis not present

## 2022-11-30 DIAGNOSIS — Z1211 Encounter for screening for malignant neoplasm of colon: Secondary | ICD-10-CM

## 2022-11-30 LAB — LIPID PANEL
Cholesterol: 209 mg/dL — ABNORMAL HIGH (ref 0–200)
HDL: 37.1 mg/dL — ABNORMAL LOW (ref >39.00)
LDL Cholesterol: 134 mg/dL — ABNORMAL HIGH (ref 0–99)
NonHDL: 172
Total CHOL/HDL Ratio: 6
Triglycerides: 192 mg/dL — ABNORMAL HIGH (ref 0.0–149.0)
VLDL: 38.4 mg/dL (ref 0.0–40.0)

## 2022-11-30 LAB — RENAL FUNCTION PANEL
Albumin: 4.1 g/dL (ref 3.5–5.2)
BUN: 18 mg/dL (ref 6–23)
CO2: 28 mEq/L (ref 19–32)
Calcium: 9.6 mg/dL (ref 8.4–10.5)
Chloride: 100 mEq/L (ref 96–112)
Creatinine, Ser: 1.1 mg/dL (ref 0.40–1.50)
GFR: 70.26 mL/min (ref >60.00)
Glucose, Bld: 219 mg/dL — ABNORMAL HIGH (ref 70–99)
Phosphorus: 3.3 mg/dL (ref 2.3–4.6)
Potassium: 4.2 mEq/L (ref 3.5–5.1)
Sodium: 136 mEq/L (ref 135–145)

## 2022-11-30 LAB — CBC
HCT: 42.1 % (ref 39.0–52.0)
Hemoglobin: 14 g/dL (ref 13.0–17.0)
MCHC: 33.3 g/dL (ref 30.0–36.0)
MCV: 87.9 fl (ref 78.0–100.0)
Platelets: 261 10*3/uL (ref 150.0–400.0)
RBC: 4.79 Mil/uL (ref 4.22–5.81)
RDW: 14.1 % (ref 11.5–15.5)
WBC: 5.8 10*3/uL (ref 4.0–10.5)

## 2022-11-30 LAB — HEPATIC FUNCTION PANEL
ALT: 13 U/L (ref 0–53)
AST: 13 U/L (ref 0–37)
Albumin: 4.1 g/dL (ref 3.5–5.2)
Alkaline Phosphatase: 92 U/L (ref 39–117)
Bilirubin, Direct: 0 mg/dL (ref 0.0–0.3)
Total Bilirubin: 0.3 mg/dL (ref 0.2–1.2)
Total Protein: 6.6 g/dL (ref 6.0–8.3)

## 2022-11-30 LAB — PSA: PSA: 1.77 ng/mL (ref 0.10–4.00)

## 2022-11-30 LAB — HM DIABETES FOOT EXAM

## 2022-11-30 LAB — MICROALBUMIN / CREATININE URINE RATIO
Creatinine,U: 67 mg/dL
Microalb Creat Ratio: 289.5 mg/g — ABNORMAL HIGH (ref 0.0–30.0)
Microalb, Ur: 194 mg/dL — ABNORMAL HIGH (ref 0.0–1.9)

## 2022-11-30 LAB — POCT GLYCOSYLATED HEMOGLOBIN (HGB A1C): Hemoglobin A1C: 9.9 % — AB (ref 4.0–5.6)

## 2022-11-30 LAB — TSH: TSH: 1.57 u[IU]/mL (ref 0.35–5.50)

## 2022-11-30 MED ORDER — TIRZEPATIDE 2.5 MG/0.5ML ~~LOC~~ SOAJ: 2.5000 mg | SUBCUTANEOUS | 1 refills | Status: DC

## 2022-11-30 MED ORDER — SPIRONOLACTONE 25 MG PO TABS: 25.0000 mg | ORAL_TABLET | Freq: Every day | ORAL | 3 refills | Status: DC

## 2022-11-30 NOTE — Assessment & Plan Note (Signed)
On valsartan Foamy urine---?protein or sugar May need nephrology

## 2022-11-30 NOTE — Assessment & Plan Note (Signed)
BP Readings from Last 3 Encounters:  11/30/22 (!) 180/96  06/29/22 (!) 161/103  03/14/22 140/78   Out of control On verapamil 240 and valsartan/HCTZ 320/25 Will add spironolactone 25 See back in 2-3 weeks

## 2022-11-30 NOTE — Assessment & Plan Note (Signed)
Lab Results  Component Value Date   HGBA1C 9.9 (A) 11/30/2022   Improved but still poor Will add mounjaro and titrate Faxiga 5, glipizide 10 bid, metformin 1000 daily

## 2022-11-30 NOTE — Assessment & Plan Note (Signed)
Really needs to work on fitness and better eating Overdue for colon Will check PSA Doesn't want any vaccines

## 2022-11-30 NOTE — Progress Notes (Signed)
Subjective:    Patient ID: Randall Pace, male    DOB: July 20, 1956, 67 y.o.   MRN: [id number]  HPI Here with wife for physical  He wonders about starting mounjaro Only takes the metformin once a day---concerned about what he has heard On the glipizide He tries to eat well---but snacks bad, like when he is in the car (and watching TV) No foot numbness or tingling---but has decreased sensation in his feet  Has trouble sleeping---not from nocturia Notes foam in his urine  Last GFR 58  Current Outpatient Medications on File Prior to Visit  Medication Sig Dispense Refill   aspirin 81 MG tablet Take 81 mg by mouth daily.     atorvastatin (LIPITOR) 40 MG tablet TAKE 1 TABLET BY MOUTH EVERY DAY 30 tablet 0   cetirizine (ZYRTEC ALLERGY) 10 MG tablet Take 1 tablet (10 mg total) by mouth daily. 30 tablet 6   cyclobenzaprine (FLEXERIL) 5 MG tablet Take 1 tablet (5 mg total) by mouth 2 (two) times daily as needed for muscle spasms. 15 tablet 0   dapagliflozin propanediol (FARXIGA) 5 MG TABS tablet Take 1 tablet (5 mg total) by mouth daily before breakfast. 90 tablet 3   fenofibrate 54 MG tablet TAKE 1 TABLET(54 MG) BY MOUTH DAILY 30 tablet 0   fexofenadine (ALLEGRA) 180 MG tablet Take 180 mg by mouth daily.     fluticasone (FLONASE) 50 MCG/ACT nasal spray Place 2 sprays into both nostrils daily. 16 g 6   gabapentin (NEURONTIN) 300 MG capsule Take 300 mg by mouth 2 (two) times daily.     glipiZIDE (GLUCOTROL) 10 MG tablet TAKE 1 TABLET (10 MG TOTAL) BY MOUTH TWICE A DAY BEFORE A MEAL 180 tablet 0   Lancets 30G MISC 1 each by Does not apply route 2 (two) times daily. 200 each 1   metFORMIN (GLUCOPHAGE) 1000 MG tablet TAKE 1 TABLET (1,000 MG TOTAL) BY MOUTH TWICE A DAY WITH FOOD (Patient taking differently: 1,000 mg. Pt taking 1 a day) 60 tablet 0   omeprazole (PRILOSEC) 20 MG capsule TAKE 1 CAPSULE BY MOUTH EVERY DAY 30 capsule 0   ONETOUCH ULTRA test strip USE TO TEST TWICE DAILY 200 strip 1    sildenafil (VIAGRA) 100 MG tablet TAKE 1/2 TO 1 TABLET(50 TO 100 MG) BY MOUTH DAILY AS NEEDED FOR ERECTILE DYSFUNCTION 10 tablet 11   triamcinolone ointment (KENALOG) 0.5 % Apply 1 application topically 2 (two) times daily. 30 g 0   valsartan-hydrochlorothiazide (DIOVAN-HCT) 320-25 MG tablet TAKE 1 TABLET BY MOUTH EVERY DAY 30 tablet 0   verapamil (CALAN-SR) 240 MG CR tablet TAKE 1 TABLET BY MOUTH EVERYDAY AT BEDTIME 30 tablet 0   montelukast (SINGULAIR) 10 MG tablet TAKE 1 TABLET(10 MG) BY MOUTH AT BEDTIME 90 tablet 1   No current facility-administered medications on file prior to visit.    No Known Allergies  Past Medical History:  Diagnosis Date   Arthritis    low back   Diabetes mellitus    GERD (gastroesophageal reflux disease)    History of chicken pox    Hypertension    Migraine    Patellofemoral pain syndrome    Sleep apnea     Past Surgical History:  Procedure Laterality Date   NASAL SINUS SURGERY     palatoplasts     TONSILLECTOMY     uvoloplasty      Family History  Problem Relation Age of Onset   Cancer Father  prostate    Social History   Socioeconomic History   Marital status: Married    Spouse name: Not on file   Number of children: 2   Years of education: Not on file   Highest education level: Not on file  Occupational History   Occupation: Scientist, clinical (histocompatibility and immunogenetics): [employer]  Tobacco Use   Smoking status: Never    Passive exposure: Past   Smokeless tobacco: Never  Vaping Use   Vaping Use: Never used  Substance and Sexual Activity   Alcohol use: Yes    Comment: occasional   Drug use: No   Sexual activity: Yes  Other Topics Concern   Not on file  Social History Narrative   No living will   Wife should be health care POA   Would accept resuscitation   Not sure about tube feeds   Social Determinants of Health   Financial Resource Strain: Not on file  Food Insecurity: Not on file  Transportation Needs: Not on file  Physical Activity:  Not on file  Stress: Not on file  Social Connections: Not on file  Intimate Partner Violence: Not on file   Review of Systems  Constitutional:  Negative for fatigue and unexpected weight change.       Wears seat belt  HENT:  Positive for hearing loss.   Eyes:  Negative for visual disturbance.  Respiratory:  Positive for cough and shortness of breath.        Thinks he coughs less since on gabapentin atrovent nasal spray  Cardiovascular:  Negative for leg swelling.       Some chest pain--"maybe indigestion". Not exertional  Not exercising No palpitations  Gastrointestinal:  Positive for constipation. Negative for abdominal pain and blood in stool.  Genitourinary:  Positive for frequency. Negative for difficulty urinating.  Musculoskeletal:  Positive for back pain.  Neurological:  Positive for dizziness. Negative for syncope.       Still gets vertigo and vision changes (diplopia) that is positional  Psychiatric/Behavioral:  Positive for sleep disturbance. Negative for dysphoric mood. The patient is not nervous/anxious.        Just worries about his stomach and abdominal hernia       Objective:   Physical Exam Constitutional:      Appearance: Normal appearance.  HENT:     Mouth/Throat:     Pharynx: No oropharyngeal exudate or posterior oropharyngeal erythema.  Eyes:     Conjunctiva/sclera: Conjunctivae normal.     Pupils: Pupils are equal, round, and reactive to light.  Cardiovascular:     Rate and Rhythm: Normal rate and regular rhythm.     Pulses: Normal pulses.     Heart sounds:     No gallop.     Comments: Gr 2/6 aortic systolic murmur Pulmonary:     Effort: Pulmonary effort is normal.     Breath sounds: Normal breath sounds. No wheezing or rales.  Abdominal:     Palpations: Abdomen is soft.     Tenderness: There is no abdominal tenderness.     Comments: Small reducible umbilical hernia  Musculoskeletal:     Cervical back: Neck supple.     Right lower leg: No edema.      Left lower leg: No edema.  Lymphadenopathy:     Cervical: No cervical adenopathy.  Skin:    Findings: No lesion or rash.     Comments: No foot lesions  Neurological:     General: No focal deficit present.  Mental Status: He is alert and oriented to person, place, and time.     Comments: Decreased sensation in feet  Psychiatric:        Mood and Affect: Mood normal.        Behavior: Behavior normal.            Assessment & Plan:

## 2022-12-08 ENCOUNTER — Encounter: Payer: Self-pay | Admitting: Nurse Practitioner

## 2022-12-12 ENCOUNTER — Telehealth: Payer: Self-pay | Admitting: Internal Medicine

## 2022-12-12 NOTE — Telephone Encounter (Signed)
Patient called in stating that his blood pressure last night was running 201/126 and just now it was 197/118. He stated that he has been experiencing an headache, dizziness, and SOB. He stated that he is using a home monitor and it could be that. Sent over to access nurse.

## 2022-12-12 NOTE — Telephone Encounter (Signed)
[facility] - Client TELEPHONE ADVICE RECORD [facility]urse Patient Name: Randall Pace Gender: Male DOB: 11-07-1955 Age: 67 Y 11 M 1 D Return Phone Number: [phone] (Primary) Address[place]  [address] Client [facility] - Client Client Site [place] - Day Provider Viviana Simpler- MD Contact Type Call Who Is Calling Patient / Member / Family / Caregiver Call Type Triage / Clinical Relationship To Patient Self Return Phone Number [phone] (Primary) Chief Complaint BLOOD PRESSURE HIGH - Systolic (top number) [id number] or greater Reason for Call Symptomatic / Request for Health Information Initial Comment Caller states his BP reading was 201/126. Caller states he has a headache and dizziness. Translation No Nurse Assessment Nurse: Lavina Hamman, RN, Thomasena Edis Date/Time (Eastern Time): 12/12/2022 12:37:03 PM Confirm and document reason for call. If symptomatic, describe symptoms. ---Caller states BP 201/126 Does the patient have any new or worsening symptoms? ---Yes Will a triage be completed? ---Yes Related visit to physician within the last 2 weeks? ---No Does the PT have any chronic conditions? (i.e. diabetes, asthma, this includes High risk factors for pregnancy, etc.) ---Yes List chronic conditions. ---HTN, DM, Sleep apnea, Acid reflux Is this a behavioral health or substance abuse call? ---No Guidelines Guideline Title Affirmed Question Affirmed Notes Nurse Date/Time (Eastern Time) Blood Pressure - High [id number] Systolic BP >= A999333 OR Diastolic >= 123456 AND A999333 having NO cardiac or neurologic symptoms Florentina Addison 12/12/2022 12:36:58 PM Disp. Time Eilene Ghazi Time) Disposition Final User 12/12/2022 12:35:37 PM Send to Urgent Queue Hessie Knows 12/12/2022 12:46:01 PM See HCP within 4 Hours (or PCP triage) Yes Lavina Hamman, RN, Thomasena Edis PLEASE NOTE: All timestamps contained  within this report are represented as [place] Standard Time. CONFIDENTIALTY NOTICE: This fax transmission is intended only for the addressee. It contains information that is legally privileged, confidential or otherwise protected from use or disclosure. If you are not the intended recipient, you are strictly prohibited from reviewing, disclosing, copying using or disseminating any of this information or taking any action in reliance on or regarding this information. If you have received this fax in error, please notify us immediately by telephone so that we can arrange for its return to [place]. Phone: [phone], Toll-Free: [phone], Fax: [phone] Page: 2 of 2 Call Id: [id number] Final Disposition 12/12/2022 12:46:01 PM See HCP within 4 Hours (or PCP triage) Deatra Ina, RN, Thomasena Edis Caller Disagree/Comply Comply Caller Understands Yes PreDisposition InappropriateToAsk Care Advice Given Per Guideline SEE HCP (OR PCP TRIAGE) WITHIN 4 HOURS: * IF OFFICE WILL BE OPEN: You need to be seen within the next 3 or 4 hours. Call your doctor (or NP/PA) now or as soon as the office opens. * Weakness or numbness of the face, arm or leg on one side of the body occurs CALL BACK IF: * Difficulty walking, difficulty talking, or severe headache occurs CARE ADVICE given per High Blood Pressure (Adult) guideline. * Chest pain or difficulty breathing occurs * You become worse Comments User: Farrel Gobble, RN Date/Time Eilene Ghazi Time): 12/12/2022 12:45:24 PM No answer at backline. Referrals REFERRED TO PCP OFFICE

## 2022-12-12 NOTE — Telephone Encounter (Signed)
Called patient has been taking blood pressure medications has not had any missed doses. When he checks he makes sure he has been sitting for a few minutes. He does use the same machine every time.  Patient states that he has had headache and some sob for last week or so. He states that he is having issues finishing sentences when talking and walking.  He has checked several times over the last weeks. And all readings have been 197/118 and higher.  Headache is a dull ache that feels like it is more in the right side of head into eye area but can be on left side at times. Pain level rated 2/10. Has some improvement with OTC medications. Patient states to help with headache he has been chewing asa 325 mg two at a time. States that he also has been taking his 81 mg asa daily. States that he has done this up to twice a day.   Patient denies any chest pain, palpitation or dizziness.   Spoke to Dr. Silvio Pate and he recommended review red words and schedule for appointment with him tomorrow. Ok to add in one of the 15 min slots.   I have reviewed red words with patient. If ANY changes in headache or any new symptoms he will go to ED for valuation. Patient agreed to recommendations if any changes he will go to ED. If any questions he will give [place] a call.

## 2022-12-13 ENCOUNTER — Ambulatory Visit (INDEPENDENT_AMBULATORY_CARE_PROVIDER_SITE_OTHER): Payer: BC Managed Care – PPO | Admitting: Internal Medicine

## 2022-12-13 ENCOUNTER — Encounter: Payer: Self-pay | Admitting: Internal Medicine

## 2022-12-13 VITALS — BP 158/96 | HR 88 | Temp 97.6°F | Ht 67.0 in | Wt 202.0 lb

## 2022-12-13 DIAGNOSIS — E1140 Type 2 diabetes mellitus with diabetic neuropathy, unspecified: Secondary | ICD-10-CM | POA: Diagnosis not present

## 2022-12-13 DIAGNOSIS — I10 Essential (primary) hypertension: Secondary | ICD-10-CM | POA: Diagnosis not present

## 2022-12-13 LAB — RENAL FUNCTION PANEL
Albumin: 4 g/dL (ref 3.5–5.2)
BUN: 16 mg/dL (ref 6–23)
CO2: 27 mEq/L (ref 19–32)
Calcium: 9.9 mg/dL (ref 8.4–10.5)
Chloride: 102 mEq/L (ref 96–112)
Creatinine, Ser: 1.2 mg/dL (ref 0.40–1.50)
GFR: 63.28 mL/min (ref >60.00)
Glucose, Bld: 116 mg/dL — ABNORMAL HIGH (ref 70–99)
Phosphorus: 3.2 mg/dL (ref 2.3–4.6)
Potassium: 4.2 mEq/L (ref 3.5–5.1)
Sodium: 134 mEq/L — ABNORMAL LOW (ref 135–145)

## 2022-12-13 NOTE — Assessment & Plan Note (Signed)
BP Readings from Last 3 Encounters:  12/13/22 (!) 158/96  11/30/22 (!) 180/96  06/29/22 (!) 161/103   BP very high at home Repeat here 172/94--which is improved He is not certain he started the spironolactone-but will check renal panel anyway Has new proteinuria, headache, etc Will set up urgently with nephrology (may need to go back to cardiology as well----given worsening DOE, etc---though stress test reassuring in 2023)

## 2022-12-13 NOTE — Assessment & Plan Note (Signed)
Has taken 2 doses of mounjaro--no clear effect on appetite but no side effects. Will plan to titrate up if tolerates a couple more doses

## 2022-12-13 NOTE — Progress Notes (Signed)
Subjective:    Patient ID: Randall Pace, male    DOB: [birth year], 67 y.o.   MRN: [id number]  HPI Here due to persistent BP elevation, headaches, etc  He is not sure if he ever started the spironolactone  BP very high at home-- 200s/120s Yesterday --down to 190/118 Plans to bring in his machine to compare to ours Having headaches--not severe--mostly left sided  No chest pain Gets SOB--even just talking to customers Really bad going up stairs  Current Outpatient Medications on File Prior to Visit  Medication Sig Dispense Refill   aspirin 81 MG tablet Take 81 mg by mouth daily.     atorvastatin (LIPITOR) 40 MG tablet TAKE 1 TABLET BY MOUTH EVERY DAY 30 tablet 0   cetirizine (ZYRTEC ALLERGY) 10 MG tablet Take 1 tablet (10 mg total) by mouth daily. 30 tablet 6   dapagliflozin propanediol (FARXIGA) 5 MG TABS tablet Take 1 tablet (5 mg total) by mouth daily before breakfast. 90 tablet 3   fenofibrate 54 MG tablet TAKE 1 TABLET(54 MG) BY MOUTH DAILY 30 tablet 0   fexofenadine (ALLEGRA) 180 MG tablet Take 180 mg by mouth daily.     gabapentin (NEURONTIN) 300 MG capsule Take 300 mg by mouth 2 (two) times daily.     glipiZIDE (GLUCOTROL) 10 MG tablet TAKE 1 TABLET (10 MG TOTAL) BY MOUTH TWICE A DAY BEFORE A MEAL 180 tablet 0   Lancets 30G MISC 1 each by Does not apply route 2 (two) times daily. 200 each 1   metFORMIN (GLUCOPHAGE) 1000 MG tablet TAKE 1 TABLET (1,000 MG TOTAL) BY MOUTH TWICE A DAY WITH FOOD (Patient taking differently: 1,000 mg. Pt taking 1 a day) 60 tablet 0   omeprazole (PRILOSEC) 20 MG capsule TAKE 1 CAPSULE BY MOUTH EVERY DAY 30 capsule 0   ONETOUCH ULTRA test strip USE TO TEST TWICE DAILY 200 strip 1   sildenafil (VIAGRA) 100 MG tablet TAKE 1/2 TO 1 TABLET(50 TO 100 MG) BY MOUTH DAILY AS NEEDED FOR ERECTILE DYSFUNCTION 10 tablet 11   tirzepatide (MOUNJARO) 2.5 MG/0.5ML Pen Inject 2.5 mg into the skin once a week. 2 mL 1   valsartan-hydrochlorothiazide (DIOVAN-HCT)  320-25 MG tablet TAKE 1 TABLET BY MOUTH EVERY DAY 30 tablet 0   verapamil (CALAN-SR) 240 MG CR tablet TAKE 1 TABLET BY MOUTH EVERYDAY AT BEDTIME 30 tablet 0   montelukast (SINGULAIR) 10 MG tablet TAKE 1 TABLET(10 MG) BY MOUTH AT BEDTIME 90 tablet 1   spironolactone (ALDACTONE) 25 MG tablet Take 1 tablet (25 mg total) by mouth daily. 90 tablet 3   No current facility-administered medications on file prior to visit.    No Known Allergies  Past Medical History:  Diagnosis Date   Arthritis    low back   Diabetes mellitus    GERD (gastroesophageal reflux disease)    History of chicken pox    Hypertension    Migraine    Patellofemoral pain syndrome    Sleep apnea     Past Surgical History:  Procedure Laterality Date   NASAL SINUS SURGERY     palatoplasts     TONSILLECTOMY     uvoloplasty      Family History  Problem Relation Age of Onset   Cancer Father        prostate    Social History   Socioeconomic History   Marital status: Married    Spouse name: Not on file   Number of children: 2  Years of education: Not on file   Highest education level: Not on file  Occupational History   Occupation: Sales    Employer: [employer]  Tobacco Use   Smoking status: Never    Passive exposure: Past   Smokeless tobacco: Never  Vaping Use   Vaping Use: Never used  Substance and Sexual Activity   Alcohol use: Yes    Comment: occasional   Drug use: No   Sexual activity: Yes  Other Topics Concern   Not on file  Social History Narrative   No living will   Wife should be health care POA   Would accept resuscitation   Not sure about tube feeds   Social Determinants of Health   Financial Resource Strain: Not on file  Food Insecurity: Not on file  Transportation Needs: Not on file  Physical Activity: Not on file  Stress: Not on file  Social Connections: Not on file  Intimate Partner Violence: Not on file   Review of Systems Ongoing vision changes---has needed  prescription changes Not sleeping well--nothing new    Objective:   Physical Exam Constitutional:      Appearance: Normal appearance.  Cardiovascular:     Rate and Rhythm: Normal rate and regular rhythm.     Heart sounds:     No gallop.     Comments: Gr 3/6 aortic systolic murmur Pulmonary:     Effort: Pulmonary effort is normal.     Breath sounds: Normal breath sounds. No wheezing or rales.  Musculoskeletal:     Cervical back: Neck supple.     Right lower leg: No edema.     Left lower leg: No edema.  Lymphadenopathy:     Cervical: No cervical adenopathy.  Neurological:     Mental Status: He is alert.            Assessment & Plan:

## 2022-12-13 NOTE — Telephone Encounter (Signed)
Sounds like malignant hypertension. I will assess, probably add a beta blocker and get him urgent referral (nephrology)

## 2022-12-17 ENCOUNTER — Other Ambulatory Visit: Payer: Self-pay | Admitting: Internal Medicine

## 2022-12-17 DIAGNOSIS — I10 Essential (primary) hypertension: Secondary | ICD-10-CM

## 2022-12-18 ENCOUNTER — Encounter: Payer: Self-pay | Admitting: Pharmacist

## 2022-12-20 ENCOUNTER — Other Ambulatory Visit: Payer: Self-pay | Admitting: Internal Medicine

## 2022-12-22 ENCOUNTER — Other Ambulatory Visit: Payer: Self-pay | Admitting: Internal Medicine

## 2022-12-22 DIAGNOSIS — I10 Essential (primary) hypertension: Secondary | ICD-10-CM

## 2022-12-27 ENCOUNTER — Encounter: Payer: Self-pay | Admitting: Internal Medicine

## 2022-12-27 ENCOUNTER — Ambulatory Visit (INDEPENDENT_AMBULATORY_CARE_PROVIDER_SITE_OTHER): Payer: BC Managed Care – PPO | Admitting: Internal Medicine

## 2022-12-27 VITALS — BP 148/100 | HR 110 | Temp 97.1°F | Ht 67.0 in | Wt 208.0 lb

## 2022-12-27 DIAGNOSIS — I10 Essential (primary) hypertension: Secondary | ICD-10-CM | POA: Diagnosis not present

## 2022-12-27 DIAGNOSIS — E1140 Type 2 diabetes mellitus with diabetic neuropathy, unspecified: Secondary | ICD-10-CM | POA: Diagnosis not present

## 2022-12-27 MED ORDER — TIRZEPATIDE 5 MG/0.5ML ~~LOC~~ SOAJ: 5.0000 mg | SUBCUTANEOUS | 3 refills | Status: DC

## 2022-12-27 MED ORDER — VERAPAMIL HCL ER 360 MG PO CP24: 360.0000 mg | ORAL_CAPSULE | Freq: Every day | ORAL | 3 refills | Status: DC

## 2022-12-27 NOTE — Assessment & Plan Note (Signed)
BP Readings from Last 3 Encounters:  12/27/22 (!) 148/100  12/13/22 (!) 158/96  11/30/22 (!) 180/96   Repeat 156/94 Clearly improved but not good Will check renal duplex Awaiting nephrology appointment Will increase the calan to '360mg'$ , continue spironolactone 25, valsartan/HCTZ 320/25

## 2022-12-27 NOTE — Assessment & Plan Note (Signed)
Hasn't been checking sugars No problems with mounjaro--will increase to '5mg'$  weekly Continue glipizide, farxiga, metformn

## 2022-12-27 NOTE — Progress Notes (Signed)
Subjective:    Patient ID: Randall Pace, male    DOB: 1955-12-31, 67 y.o.   MRN: [id number]  HPI Here for follow up of malignant hypertension  Did get contacted by nephrology---but can't get in till later in March Still monitoring BP---165/102 at home  Twinge of headache this morning---like when rushing this morning Stress with taking care of wife's issues Clearly headache is better  No chest pain Breathing seems better now--but has cough with deep breath in  No problems with mounjaro Not testing sugars lately  Current Outpatient Medications on File Prior to Visit  Medication Sig Dispense Refill   aspirin 81 MG tablet Take 81 mg by mouth daily.     atorvastatin (LIPITOR) 40 MG tablet TAKE 1 TABLET BY MOUTH EVERY DAY 30 tablet 0   cetirizine (ZYRTEC ALLERGY) 10 MG tablet Take 1 tablet (10 mg total) by mouth daily. 30 tablet 6   dapagliflozin propanediol (FARXIGA) 5 MG TABS tablet Take 1 tablet (5 mg total) by mouth daily before breakfast. 90 tablet 3   fenofibrate 54 MG tablet TAKE 1 TABLET(54 MG) BY MOUTH DAILY 30 tablet 0   fexofenadine (ALLEGRA) 180 MG tablet Take 180 mg by mouth daily.     gabapentin (NEURONTIN) 300 MG capsule Take 300 mg by mouth 2 (two) times daily.     glipiZIDE (GLUCOTROL) 10 MG tablet TAKE 1 TABLET (10 MG TOTAL) BY MOUTH TWICE A DAY BEFORE A MEAL 180 tablet 3   Lancets 30G MISC 1 each by Does not apply route 2 (two) times daily. 200 each 1   metFORMIN (GLUCOPHAGE) 1000 MG tablet TAKE 1 TABLET (1,000 MG TOTAL) BY MOUTH TWICE A DAY WITH FOOD (Patient taking differently: 1,000 mg. Pt taking 1 a day) 60 tablet 0   omeprazole (PRILOSEC) 20 MG capsule TAKE 1 CAPSULE BY MOUTH EVERY DAY 30 capsule 0   ONETOUCH ULTRA test strip USE TO TEST TWICE DAILY 200 strip 1   sildenafil (VIAGRA) 100 MG tablet TAKE 1/2 TO 1 TABLET(50 TO 100 MG) BY MOUTH DAILY AS NEEDED FOR ERECTILE DYSFUNCTION 10 tablet 11   spironolactone (ALDACTONE) 25 MG tablet Take 1 tablet (25 mg  total) by mouth daily. 90 tablet 3   tirzepatide (MOUNJARO) 2.5 MG/0.5ML Pen Inject 2.5 mg into the skin once a week. 2 mL 1   valsartan-hydrochlorothiazide (DIOVAN-HCT) 320-25 MG tablet TAKE 1 TABLET BY MOUTH EVERY DAY 30 tablet 0   verapamil (CALAN-SR) 240 MG CR tablet TAKE 1 TABLET BY MOUTH EVERYDAY AT BEDTIME 30 tablet 0   montelukast (SINGULAIR) 10 MG tablet TAKE 1 TABLET(10 MG) BY MOUTH AT BEDTIME 90 tablet 1   No current facility-administered medications on file prior to visit.    No Known Allergies  Past Medical History:  Diagnosis Date   Arthritis    low back   Diabetes mellitus    GERD (gastroesophageal reflux disease)    History of chicken pox    Hypertension    Migraine    Patellofemoral pain syndrome    Sleep apnea     Past Surgical History:  Procedure Laterality Date   NASAL SINUS SURGERY     palatoplasts     TONSILLECTOMY     uvoloplasty      Family History  Problem Relation Age of Onset   Cancer Father        prostate    Social History   Socioeconomic History   Marital status: Married    Spouse name: Not  on file   Number of children: 2   Years of education: Not on file   Highest education level: Not on file  Occupational History   Occupation: Scientist, clinical (histocompatibility and immunogenetics): [employer]  Tobacco Use   Smoking status: Never    Passive exposure: Past   Smokeless tobacco: Never  Vaping Use   Vaping Use: Never used  Substance and Sexual Activity   Alcohol use: Yes    Comment: occasional   Drug use: No   Sexual activity: Yes  Other Topics Concern   Not on file  Social History Narrative   No living will   Wife should be health care POA   Would accept resuscitation   Not sure about tube feeds   Social Determinants of Health   Financial Resource Strain: Not on file  Food Insecurity: Not on file  Transportation Needs: Not on file  Physical Activity: Not on file  Stress: Not on file  Social Connections: Not on file  Intimate Partner Violence: Not on  file   Review of Systems Weight is up some No edema though    Objective:   Physical Exam Constitutional:      Appearance: Normal appearance.  Cardiovascular:     Rate and Rhythm: Normal rate and regular rhythm.     Heart sounds:     No gallop.     Comments: Gr 3/6 coarse systolic murmur Pulmonary:     Effort: Pulmonary effort is normal.     Breath sounds: Normal breath sounds. No wheezing or rales.  Musculoskeletal:     Cervical back: Neck supple.     Right lower leg: No edema.     Left lower leg: No edema.  Lymphadenopathy:     Cervical: No cervical adenopathy.  Neurological:     Mental Status: He is alert.  Psychiatric:        Mood and Affect: Mood normal.        Behavior: Behavior normal.            Assessment & Plan:

## 2023-01-10 ENCOUNTER — Ambulatory Visit: Payer: BC Managed Care – PPO | Admitting: Nurse Practitioner

## 2023-01-13 ENCOUNTER — Other Ambulatory Visit: Payer: Self-pay | Admitting: Internal Medicine

## 2023-01-22 ENCOUNTER — Other Ambulatory Visit: Payer: Self-pay | Admitting: Internal Medicine

## 2023-01-24 ENCOUNTER — Telehealth: Payer: Self-pay | Admitting: *Deleted

## 2023-01-24 ENCOUNTER — Other Ambulatory Visit: Payer: Self-pay | Admitting: Internal Medicine

## 2023-01-24 DIAGNOSIS — I10 Essential (primary) hypertension: Secondary | ICD-10-CM | POA: Diagnosis not present

## 2023-01-24 DIAGNOSIS — E785 Hyperlipidemia, unspecified: Secondary | ICD-10-CM | POA: Diagnosis not present

## 2023-01-24 DIAGNOSIS — K219 Gastro-esophageal reflux disease without esophagitis: Secondary | ICD-10-CM | POA: Diagnosis not present

## 2023-01-24 DIAGNOSIS — E1122 Type 2 diabetes mellitus with diabetic chronic kidney disease: Secondary | ICD-10-CM | POA: Diagnosis not present

## 2023-01-24 NOTE — Telephone Encounter (Signed)
Patient stopped by office stating that he has not had a bowel movement in 4-5 days. Patient stated that he is miserable and feels like he needs to go. Patient stated that he needs something that will help move and regulate his bowel movements. Patient denies pain or fever. Patient stated that he has  taken one dose of Miralax which did nothing.  After speaking to Dr. Silvio Pate patient was advised to take Miralax three times a day, and senna two a day until his problem clears up.. Patient was advised if he has not had a bowel movement by tomorrow evening he can use Oral Dulcolax. Patient was given ER precautions and he verbalized understanding. Patient stated that he had an appointment with a GI doctor a couple of weeks ago and was at their office. Patient stated that they came in the room and told him the doctor had to leave because of an emergency. Patient stated that they told him they would call him and get him rescheduled. Patient stated that he never heard back from them. Patient stated that he has left another message with GI hoping that they will call him back.  Patient was given ER precautions and he verbalized understanding.

## 2023-01-25 ENCOUNTER — Other Ambulatory Visit: Payer: Self-pay | Admitting: Nephrology

## 2023-01-25 DIAGNOSIS — E1122 Type 2 diabetes mellitus with diabetic chronic kidney disease: Secondary | ICD-10-CM

## 2023-01-25 DIAGNOSIS — N1831 Chronic kidney disease, stage 3a: Secondary | ICD-10-CM

## 2023-01-25 DIAGNOSIS — R809 Proteinuria, unspecified: Secondary | ICD-10-CM

## 2023-01-29 ENCOUNTER — Telehealth (HOSPITAL_COMMUNITY): Payer: Self-pay | Admitting: Cardiology

## 2023-01-29 NOTE — Telephone Encounter (Signed)
I have called and spoken with patient in 2 separate occassions to schedule echocardiogram. Patient wants to hold off to schedule til after he meets his deductible. He will call the office when he is ready to schedule. Order will be removed from the active echo wq. We will reinstate when he calls back to schedule. Thank you

## 2023-02-05 ENCOUNTER — Telehealth: Payer: Self-pay

## 2023-02-05 NOTE — Telephone Encounter (Signed)
See other message

## 2023-02-05 NOTE — Telephone Encounter (Signed)
-----   Message from Binnie Kand, LPN sent at 06/07/8279  1:14 PM EDT ----- Regarding: FW: Patient concern  ----- Message ----- From: Ethelle Lyon Sent: 02/05/2023   1:04 PM EDT To: [facility] Clinical Pool Subject: Patient concern                                 ----- Message ----- From: Ethelle Lyon Sent: 02/02/2023   3:55 PM EDT To: [facility] Clinical [facility] Subject: Patient concern                                Hello,  I just had a lengthy call with this patient during which he stated he had an appointment for GI Consult in March at [facility]. He stated once he got to their office, they told him the doctor was not available and he would need to be rescheduled. They did not reschedule him at that time, but told him they would call him to reschedule but never did. He did call the office after not receiving a call and moved the Colonoscopy consult to Mar 14 2023 which he intends to keep. He was not happy with how this was handled by their office and is concerned about the delay this has caused in having his Colonoscopy done. I advised him that I would send a message to clinical staff for documentation purposes and that he should discuss with Dr. Alphonsus Sias at his next visit. No action needed at this time per patient.  Thank you,  Ethelle Lyon, CCMA, QC [facility]

## 2023-02-08 ENCOUNTER — Ambulatory Visit
Admission: RE | Admit: 2023-02-08 | Discharge: 2023-02-08 | Disposition: A | Discharge status: Home / Self Care | Payer: BC Managed Care – PPO | Source: Ambulatory Visit | Attending: Nephrology | Admitting: Nephrology

## 2023-02-08 DIAGNOSIS — E1122 Type 2 diabetes mellitus with diabetic chronic kidney disease: Secondary | ICD-10-CM | POA: Insufficient documentation

## 2023-02-08 DIAGNOSIS — N1831 Chronic kidney disease, stage 3a: Secondary | ICD-10-CM | POA: Insufficient documentation

## 2023-02-08 DIAGNOSIS — R809 Proteinuria, unspecified: Secondary | ICD-10-CM | POA: Diagnosis not present

## 2023-02-08 DIAGNOSIS — N189 Chronic kidney disease, unspecified: Secondary | ICD-10-CM | POA: Diagnosis not present

## 2023-02-09 ENCOUNTER — Telehealth: Payer: Self-pay | Admitting: Internal Medicine

## 2023-02-09 ENCOUNTER — Telehealth: Payer: Self-pay | Admitting: Cardiology

## 2023-02-09 ENCOUNTER — Other Ambulatory Visit: Payer: Self-pay | Admitting: Internal Medicine

## 2023-02-09 NOTE — Telephone Encounter (Signed)
Left message for patient informing him he will need to discuss with his PCP Dr. Alphonsus Sias about whether or not he still needs to have renal ultrasound testing on 02/14/23 as the order came from him.  Provided office number for callback if any questions.

## 2023-02-09 NOTE — Telephone Encounter (Signed)
This is not on his med list nor historical meds. Will forward to Dr Alphonsus Sias to approve.

## 2023-02-09 NOTE — Telephone Encounter (Signed)
Patient wants to know if he needs to do the Renal testing on the 04/17 because he just had a test done yesterday 04/11. Please advise

## 2023-02-09 NOTE — Telephone Encounter (Signed)
Patient stated that he received a message that he had an appointment on Saturday. If so, he does not want that appointment, nor wants to be charged. I informed the patient that I did not see a Saturday appointment, but he did have one scheduled for 4.17.24. Patient states if it is a renal appointment like his appointment yesterday, he does not want that one, but will wait for Dr. Alphonsus Sias to inform him  Patient states earlier in the week the better  [phone]

## 2023-02-12 NOTE — Telephone Encounter (Signed)
Left message on VM advising pt I did not have any more information that what Amy provided him on Friday. If he does not want the renal appointment, he needs to call them and take care of that with them.

## 2023-02-25 ENCOUNTER — Other Ambulatory Visit: Payer: Self-pay | Admitting: Internal Medicine

## 2023-03-14 ENCOUNTER — Encounter: Payer: Self-pay | Admitting: Nurse Practitioner

## 2023-03-14 ENCOUNTER — Ambulatory Visit (INDEPENDENT_AMBULATORY_CARE_PROVIDER_SITE_OTHER): Payer: BC Managed Care – PPO | Admitting: Nurse Practitioner

## 2023-03-14 ENCOUNTER — Encounter: Payer: Self-pay | Admitting: Gastroenterology

## 2023-03-14 VITALS — BP 118/72 | HR 104 | Ht 65.5 in | Wt 200.2 lb

## 2023-03-14 DIAGNOSIS — K5909 Other constipation: Secondary | ICD-10-CM | POA: Diagnosis not present

## 2023-03-14 DIAGNOSIS — Z800 Family history of malignant neoplasm of digestive organs: Secondary | ICD-10-CM | POA: Diagnosis not present

## 2023-03-14 MED ORDER — NA SULFATE-K SULFATE-MG SULF 17.5-3.13-1.6 GM/177ML PO SOLN: 1.0000 | ORAL | 0 refills | Status: DC

## 2023-03-14 NOTE — Patient Instructions (Addendum)
_______________________________________________________  If your blood pressure at your visit was 140/90 or greater, please contact your primary care physician to follow up on this. _______________________________________________________  If you are age 66 or older, your body mass index should be between 23-30. Your Body mass index is 32.82 kg/m. If this is out of the aforementioned range listed, please consider follow up with your Primary Care Provider. ________________________________________________________  The [facility] providers would like to encourage you to use [facility] to communicate with providers for non-urgent requests or questions.  Due to long hold times on the telephone, sending your provider a message by [facility] may be a faster and more efficient way to get a response.  Please allow 48 business hours for a response.  Please remember that this is for non-urgent requests.  _______________________________________________________  Bonita Quin have been scheduled for a colonoscopy. Please follow written instructions given to you at your visit today.  Please pick up your prep supplies at the pharmacy within the next 1-3 days. If you use inhalers (even only as needed), please bring them with you on the day of your procedure.  Due to recent changes in healthcare laws, you may see the results of your imaging and laboratory studies on MyChart before your provider has had a chance to review them.  We understand that in some cases there may be results that are confusing or concerning to you. Not all laboratory results come back in the same time frame and the provider may be waiting for multiple results in order to interpret others.  Please give [place] 48 hours in order for your provider to thoroughly review all the results before contacting the office for clarification of your results.   You can try using Metamucil 1 scoop in 8 to 10 ounces of water/juice daily.  Thank you for entrusting me with your  care and choosing [facility].  Willette Cluster, NP

## 2023-03-14 NOTE — Progress Notes (Signed)
Assessment and Plan   Primary GI: new - Tiajuana Amass, MD  Brief Narrative:  67 y.o. yo male whose past medical history includes but is not necessarily limited to DM2, obesity, sleep apnea, mild aortic stenosis and HTN. He has a [facility] of colon cancer in his father ( reportedly diagnosed in his 39's). Patient is referred by his PCP for colon cancer screening.   Colon cancer screening.  [facility] of colon cancer. Father had colon cancer in his 25s.  I do not have the actual colonoscopy report but patient did have a colonoscopy in 2017 with [facility].  I have the pathology report where it looks like random colon biopsies were taken and all were normal.  No polyp pathology so presumably he had no polyps at the time -Schedule for a screening colonoscopy with a two day bowel prep. The risks and benefits of colonoscopy with possible polypectomy / biopsies were discussed and the patient agrees to proceed.  -Hold Mounjauno for one week prior to procedure.   Chronic constipation defined by decreased frequency.  Previously MiraLAX plus stool softeners was not helpful.  Currently taking Perdiem as needed with good results.   -Continue with good hydration  -Trial of daily Metamucil   Type 2 diabetes.   On several diabetic medications.  Hgb A1c 9.9 in Feb 2024   History of Present Illness   Chief complaint: Needs a screening colonoscopy  Patient's last colonoscopy was done through [facility] in 2017.  I do not have the actual colonoscopy report but I do have the pathology report from his last colonoscopy.  Biopsies were taken throughout the colon and none showed any significant histopathologic change.  Kalen has not had any changes in his bowel habits.  He has struggled with constipation for 10 years.  He has previously he has struggled with constipation for 10 years.  He has previously tried MiraLAX plus stool softeners which was not very helpful.  He has tried fiber but not taking it on a regular  basis.  Currently he is taking Perdiem as needed and that has been more helpful than anything.  He does drink a lot of water.  He is not certain about fiber consumption.  Patient's father had a history of colon cancer apparently diagnosed in his 7s.  He reportedly had prostate cancer and leukemia as well.     Latest Ref Rng & Units 12/13/2022   12:00 PM 11/30/2022    8:53 AM 11/01/2021    8:32 AM  Hepatic Function  Total Protein 6.0 - 8.3 g/dL  6.6  6.9   Albumin 3.5 - 5.2 g/dL 4.0  4.1    4.1  4.3   AST 0 - 37 U/L  13  15   ALT 0 - 53 U/L  13  19   Alk Phosphatase 39 - 117 U/L  92  69   Total Bilirubin 0.2 - 1.2 mg/dL  0.3  0.4   Bilirubin, Direct 0.0 - 0.3 mg/dL  0.0         Latest Ref Rng & Units 11/30/2022    8:53 AM 06/28/2022    7:09 PM 03/09/2022    3:49 PM  CBC  WBC 4.0 - 10.5 K/uL 5.8  7.4  6.6   Hemoglobin 13.0 - 17.0 g/dL 16.1  09.6  04.5   Hematocrit 39.0 - 52.0 % 42.1  41.2  32.6   Platelets 150.0 - 400.0 K/uL 261.0  271  261.0  Echocardiogram April 2023 IMPRESSIONS   1. Left ventricular ejection fraction, by estimation, is 60 to 65%. The  left ventricle has normal function. The left ventricle has no regional  wall motion abnormalities. There is mild concentric left ventricular  hypertrophy. Left ventricular diastolic  parameters were normal.   2. Right ventricular systolic function is normal. The right ventricular  size is normal. There is normal pulmonary artery systolic pressure.   3. The mitral valve is normal in structure. Trivial mitral valve  regurgitation. No evidence of mitral stenosis.   4. The aortic valve is tricuspid. Aortic valve regurgitation is not  visualized. Mild to moderate aortic valve stenosis. Aortic valve area, by  VTI measures 1.28 cm. Aortic valve mean gradient measures 19.2 mmHg.  Aortic valve Vmax measures 2.91 m/s.   5. The inferior vena cava is normal in size with greater than 50%  respiratory variability, suggesting right atrial  pressure of 3 mmHg.    Past Medical History:  Diagnosis Date   Arthritis    low back   Diabetes mellitus    GERD (gastroesophageal reflux disease)    History of chicken pox    Hypertension    Migraine    Patellofemoral pain syndrome    Sleep apnea     Past Surgical History:  Procedure Laterality Date   NASAL SINUS SURGERY     palatoplasts     TONSILLECTOMY     uvoloplasty      Family History  Problem Relation Age of Onset   Dementia Mother    Prostate cancer Father    Colon cancer Father    Leukemia Father    Diabetes Father     Current Medications, Allergies, Family History and Social History were reviewed in [facility] electronic medical record.     Current Outpatient Medications  Medication Sig Dispense Refill   aspirin 81 MG tablet Take 81 mg by mouth daily.     atorvastatin (LIPITOR) 40 MG tablet TAKE 1 TABLET BY MOUTH EVERY DAY 30 tablet 0   cetirizine (ZYRTEC ALLERGY) 10 MG tablet Take 1 tablet (10 mg total) by mouth daily. 30 tablet 6   FARXIGA 5 MG TABS tablet TAKE 1 TABLET BY MOUTH DAILY BEFORE BREAKFAST. 90 tablet 3   fenofibrate 54 MG tablet TAKE 1 TABLET(54 MG) BY MOUTH DAILY 30 tablet 0   fexofenadine (ALLEGRA) 180 MG tablet Take 180 mg by mouth daily.     gabapentin (NEURONTIN) 300 MG capsule Take 300 mg by mouth 2 (two) times daily.     glipiZIDE (GLUCOTROL) 10 MG tablet TAKE 1 TABLET (10 MG TOTAL) BY MOUTH TWICE A DAY BEFORE A MEAL 180 tablet 3   ipratropium (ATROVENT) 0.06 % nasal spray PLACE 2 SPRAYS IN EACH NOSTRIL TWICE A DAY 15 mL 1   metFORMIN (GLUCOPHAGE) 1000 MG tablet Take 1 tablet (1,000 mg total) by mouth daily with breakfast. Pt taking 1 a day 30 tablet 5   omeprazole (PRILOSEC) 20 MG capsule TAKE 1 CAPSULE BY MOUTH EVERY DAY 90 capsule 1   sildenafil (VIAGRA) 100 MG tablet TAKE 1/2 TO 1 TABLET(50 TO 100 MG) BY MOUTH DAILY AS NEEDED FOR ERECTILE DYSFUNCTION 10 tablet 11   spironolactone (ALDACTONE) 25 MG tablet Take 1 tablet (25 mg  total) by mouth daily. 90 tablet 3   TURMERIC PO Take 1 tablet by mouth daily.     valsartan-hydrochlorothiazide (DIOVAN-HCT) 320-25 MG tablet TAKE 1 TABLET BY MOUTH EVERY DAY 90 tablet 3  verapamil (VERELAN PM) 360 MG 24 hr capsule Take 1 capsule (360 mg total) by mouth at bedtime. 90 capsule 3   montelukast (SINGULAIR) 10 MG tablet TAKE 1 TABLET(10 MG) BY MOUTH AT BEDTIME (Patient not taking: Reported on 03/14/2023) 90 tablet 1   tirzepatide (MOUNJARO) 5 MG/0.5ML Pen Inject 5 mg into the skin once a week. (Patient not taking: Reported on 03/14/2023) 2 mL 3   No current facility-administered medications for this visit.    Review of Systems: Positive for allergy, sinus trouble, arthritis, right low back pain, right buttock pain, headaches, sleeping problems and excessive thirst no chest pain.  All other systems reviewed and negative except were noted in the HPI  Physical Exam  Wt Readings from Last 3 Encounters:  03/14/23 200 lb 4 oz (90.8 kg)  12/27/22 208 lb (94.3 kg)  12/13/22 202 lb (91.6 kg)    BP 118/72 (BP Location: Left Arm, Patient Position: Sitting, Cuff Size: Normal)   Pulse (!) 104   Ht 5' 5.5" (1.664 m) Comment: height measured without shoes  Wt 200 lb 4 oz (90.8 kg)   BMI 32.82 kg/m  Constitutional:  Pleasant, generally well appearing male in no acute distress. Psychiatric: Normal mood and affect. Behavior is normal. EENT: Pupils normal.  Conjunctivae are normal. No scleral icterus. Neck supple.  Cardiovascular: Normal rate, regular rhythm.  Murmur present Pulmonary/chest: Effort normal and breath sounds normal. No wheezing, rales or rhonchi. Abdominal: Soft, nondistended, nontender. Bowel sounds active throughout.  Diastases recti .  Small umbilical hernia Neurological: Alert and oriented to person place and time.  Extremities: no edema Skin: Skin is warm and dry. No rashes noted.  Willette Cluster, NP  03/14/2023, 9:15 AM  Cc:  [name],  MD

## 2023-03-15 ENCOUNTER — Ambulatory Visit (INDEPENDENT_AMBULATORY_CARE_PROVIDER_SITE_OTHER): Payer: BC Managed Care – PPO | Admitting: Internal Medicine

## 2023-03-15 ENCOUNTER — Encounter: Payer: Self-pay | Admitting: Internal Medicine

## 2023-03-15 VITALS — BP 130/74 | HR 86 | Temp 98.6°F | Ht 65.5 in | Wt 199.0 lb

## 2023-03-15 DIAGNOSIS — Z7984 Long term (current) use of oral hypoglycemic drugs: Secondary | ICD-10-CM | POA: Diagnosis not present

## 2023-03-15 DIAGNOSIS — M5431 Sciatica, right side: Secondary | ICD-10-CM

## 2023-03-15 DIAGNOSIS — Z7985 Long-term (current) use of injectable non-insulin antidiabetic drugs: Secondary | ICD-10-CM | POA: Diagnosis not present

## 2023-03-15 DIAGNOSIS — E1140 Type 2 diabetes mellitus with diabetic neuropathy, unspecified: Secondary | ICD-10-CM | POA: Diagnosis not present

## 2023-03-15 MED ORDER — TIRZEPATIDE 7.5 MG/0.5ML ~~LOC~~ SOAJ: 7.5000 mg | SUBCUTANEOUS | 1 refills | Status: DC

## 2023-03-15 MED ORDER — PREDNISONE 20 MG PO TABS: 40.0000 mg | ORAL_TABLET | Freq: Every day | ORAL | 0 refills | Status: DC

## 2023-03-15 NOTE — Progress Notes (Signed)
Agree with the assessment and plan as outlined by Paula Guenther, NP.  Litzy Dicker E. Leander Tout, MD [facility]  

## 2023-03-15 NOTE — Assessment & Plan Note (Signed)
Will increase the mounjaro to 7.5mg  for a month--then up to 10mg  if tolerated

## 2023-03-15 NOTE — Assessment & Plan Note (Signed)
Discussed usual self limited course Will give prednisone 40 x 3, then 20 x 3 Keep moving around

## 2023-03-15 NOTE — Progress Notes (Signed)
Subjective:    Patient ID: Randall Pace, male    DOB: 02/25/1956, 67 y.o.   MRN: [id number]  HPI Here due to right back and leg pain  Started about 2 weeks ago Seemed to be worst getting out of bed---back bent and holding on just to get to bathroom Pain in right low back and down leg---to foot. Feels it in low back and buttock Wasn't able to do his work schedule--trouble getting in and out of car Seems to get better with moving around usually  Is some better now No leg weakness  Current Outpatient Medications on File Prior to Visit  Medication Sig Dispense Refill   aspirin 81 MG tablet Take 81 mg by mouth daily.     atorvastatin (LIPITOR) 40 MG tablet TAKE 1 TABLET BY MOUTH EVERY DAY 30 tablet 0   cetirizine (ZYRTEC ALLERGY) 10 MG tablet Take 1 tablet (10 mg total) by mouth daily. 30 tablet 6   FARXIGA 5 MG TABS tablet TAKE 1 TABLET BY MOUTH DAILY BEFORE BREAKFAST. 90 tablet 3   fenofibrate 54 MG tablet TAKE 1 TABLET(54 MG) BY MOUTH DAILY 30 tablet 0   fexofenadine (ALLEGRA) 180 MG tablet Take 180 mg by mouth daily.     gabapentin (NEURONTIN) 300 MG capsule Take 300 mg by mouth 2 (two) times daily.     glipiZIDE (GLUCOTROL) 10 MG tablet TAKE 1 TABLET (10 MG TOTAL) BY MOUTH TWICE A DAY BEFORE A MEAL 180 tablet 3   ipratropium (ATROVENT) 0.06 % nasal spray PLACE 2 SPRAYS IN EACH NOSTRIL TWICE A DAY 15 mL 1   metFORMIN (GLUCOPHAGE) 1000 MG tablet Take 1 tablet (1,000 mg total) by mouth daily with breakfast. Pt taking 1 a day 30 tablet 5   omeprazole (PRILOSEC) 20 MG capsule TAKE 1 CAPSULE BY MOUTH EVERY DAY 90 capsule 1   sildenafil (VIAGRA) 100 MG tablet TAKE 1/2 TO 1 TABLET(50 TO 100 MG) BY MOUTH DAILY AS NEEDED FOR ERECTILE DYSFUNCTION 10 tablet 11   spironolactone (ALDACTONE) 25 MG tablet Take 1 tablet (25 mg total) by mouth daily. 90 tablet 3   TURMERIC PO Take 1 tablet by mouth daily.     valsartan-hydrochlorothiazide (DIOVAN-HCT) 320-25 MG tablet TAKE 1 TABLET BY MOUTH EVERY  DAY 90 tablet 3   verapamil (VERELAN PM) 360 MG 24 hr capsule Take 1 capsule (360 mg total) by mouth at bedtime. 90 capsule 3   montelukast (SINGULAIR) 10 MG tablet TAKE 1 TABLET(10 MG) BY MOUTH AT BEDTIME (Patient not taking: Reported on 03/14/2023) 90 tablet 1   tirzepatide (MOUNJARO) 5 MG/0.5ML Pen Inject 5 mg into the skin once a week. (Patient not taking: Reported on 03/14/2023) 2 mL 3   No current facility-administered medications on file prior to visit.    No Known Allergies  Past Medical History:  Diagnosis Date   Arthritis    low back   Diabetes mellitus    GERD (gastroesophageal reflux disease)    History of chicken pox    Hypertension    Migraine    Patellofemoral pain syndrome    Sleep apnea     Past Surgical History:  Procedure Laterality Date   NASAL SINUS SURGERY     palatoplasts     TONSILLECTOMY     uvoloplasty      Family History  Problem Relation Age of Onset   Dementia Mother    Prostate cancer Father    Colon cancer Father    Leukemia Father  Diabetes Father     Social History   Socioeconomic History   Marital status: Married    Spouse name: Not on file   Number of children: 2   Years of education: Not on file   Highest education level: Not on file  Occupational History   Occupation: Investment banker, corporate: [employer]  Tobacco Use   Smoking status: Never    Passive exposure: Past   Smokeless tobacco: Never  Vaping Use   Vaping Use: Never used  Substance and Sexual Activity   Alcohol use: Yes    Comment: occasional   Drug use: No   Sexual activity: Yes  Other Topics Concern   Not on file  Social History Narrative   No living will   Wife should be health care POA   Would accept resuscitation   Not sure about tube feeds   Social Determinants of Health   Financial Resource Strain: Not on file  Food Insecurity: Not on file  Transportation Needs: Not on file  Physical Activity: Not on file  Stress: Not on file  Social Connections:  Not on file  Intimate Partner Violence: Not on file   Review of Systems No injury No trouble with bowel or bladder control Sugars are some better--but not really checking. Hasn't had mounjaro due to not getting it for 2 weeks     Objective:   Physical Exam Constitutional:      Appearance: Normal appearance.  Musculoskeletal:     Comments: Normal ROM right hip SLR negative  Neurological:     Mental Status: He is alert.     Comments: Some pain related right leg weakness Normal gait            Assessment & Plan:

## 2023-03-15 NOTE — Patient Instructions (Signed)
Please restart the mounjaro at the 7.5mg  dose. Take this for 4 weeks---then if you are not having problems with this, let me know and I will change you to the 10mg  dose

## 2023-03-18 ENCOUNTER — Other Ambulatory Visit: Payer: Self-pay | Admitting: Internal Medicine

## 2023-03-20 DIAGNOSIS — E785 Hyperlipidemia, unspecified: Secondary | ICD-10-CM | POA: Diagnosis not present

## 2023-03-20 DIAGNOSIS — E1122 Type 2 diabetes mellitus with diabetic chronic kidney disease: Secondary | ICD-10-CM | POA: Diagnosis not present

## 2023-03-20 DIAGNOSIS — N1831 Chronic kidney disease, stage 3a: Secondary | ICD-10-CM | POA: Diagnosis not present

## 2023-03-20 DIAGNOSIS — G4733 Obstructive sleep apnea (adult) (pediatric): Secondary | ICD-10-CM | POA: Diagnosis not present

## 2023-03-27 ENCOUNTER — Encounter: Payer: Self-pay | Admitting: Gastroenterology

## 2023-03-27 ENCOUNTER — Ambulatory Visit (AMBULATORY_SURGERY_CENTER): Payer: BC Managed Care – PPO | Admitting: Gastroenterology

## 2023-03-27 VITALS — BP 112/60 | HR 87 | Temp 98.2°F | Resp 12 | Ht 65.0 in | Wt 200.0 lb

## 2023-03-27 DIAGNOSIS — K5909 Other constipation: Secondary | ICD-10-CM

## 2023-03-27 DIAGNOSIS — Z1211 Encounter for screening for malignant neoplasm of colon: Secondary | ICD-10-CM | POA: Diagnosis not present

## 2023-03-27 DIAGNOSIS — Z800 Family history of malignant neoplasm of digestive organs: Secondary | ICD-10-CM | POA: Diagnosis not present

## 2023-03-27 MED ORDER — SODIUM CHLORIDE 0.9 % IV SOLN: 500.0000 mL | Freq: Once | INTRAVENOUS | Status: DC

## 2023-03-27 NOTE — Progress Notes (Signed)
Report to PACU, RN, vss, BBS= Clear.  

## 2023-03-27 NOTE — Patient Instructions (Signed)
Thank you for letting us take care of your healthcare needs today.    YOU HAD AN ENDOSCOPIC PROCEDURE TODAY AT THE [facility]:   Refer to the procedure report that was given to you for any specific questions about what was found during the examination.  If the procedure report does not answer your questions, please call your gastroenterologist to clarify.  If you requested that your care partner not be given the details of your procedure findings, then the procedure report has been included in a sealed envelope for you to review at your convenience later.  YOU SHOULD EXPECT: Some feelings of bloating in the abdomen. Passage of more gas than usual.  Walking can help get rid of the air that was put into your GI tract during the procedure and reduce the bloating. If you had a lower endoscopy (such as a colonoscopy or flexible sigmoidoscopy) you may notice spotting of blood in your stool or on the toilet paper. If you underwent a bowel prep for your procedure, you may not have a normal bowel movement for a few days.  Please Note:  You might notice some irritation and congestion in your nose or some drainage.  This is from the oxygen used during your procedure.  There is no need for concern and it should clear up in a day or so.  SYMPTOMS TO REPORT IMMEDIATELY:  Following lower endoscopy (colonoscopy or flexible sigmoidoscopy):  Excessive amounts of blood in the stool  Significant tenderness or worsening of abdominal pains  Swelling of the abdomen that is new, acute  Fever of 100F or higher   For urgent or emergent issues, a gastroenterologist can be reached at any hour by calling [phone]. Do not use MyChart messaging for urgent concerns.    DIET:  We do recommend a small meal at first, but then you may proceed to your regular diet.  Drink plenty of fluids but you should avoid alcoholic beverages for 24 hours.  ACTIVITY:  You should plan to take it easy for the rest of today  and you should NOT DRIVE or use heavy machinery until tomorrow (because of the sedation medicines used during the test).    FOLLOW UP: Our staff will call the number listed on your records the next business day following your procedure.  We will call around 7:15- 8:00 am to check on you and address any questions or concerns that you may have regarding the information given to you following your procedure. If we do not reach you, we will leave a message.     If any biopsies were taken you will be contacted by phone or by letter within the next 1-3 weeks.  Please call us at [phone] if you have not heard about the biopsies in 3 weeks.    SIGNATURES/CONFIDENTIALITY: You and/or your care partner have signed paperwork which will be entered into your electronic medical record.  These signatures attest to the fact that that the information above on your After Visit Summary has been reviewed and is understood.  Full responsibility of the confidentiality of this discharge information lies with you and/or your care-partner. 

## 2023-03-27 NOTE — Progress Notes (Signed)
History and Physical Interval Note:  03/27/2023 8:37 AM  Randall Pace  has presented today for endoscopic procedure(s), with the diagnosis of  Encounter Diagnoses  Name Primary?   Family history of colon cancer Yes   Chronic constipation   .  The various methods of evaluation and treatment have been discussed with the patient and/or family. After consideration of risks, benefits and other options for treatment, the patient has consented to  the endoscopic procedure(s).   The patient's history has been reviewed, patient examined, no change in status, stable for endoscopic procedure(s).  I have reviewed the patient's chart and labs.  Questions were answered to the patient's satisfaction.     Randall Pace E. Randall Rand, MD [facility]

## 2023-03-27 NOTE — Op Note (Signed)
[facility]oscopy Center Patient Name: Randall Pace Procedure Date: 03/27/2023 8:42 AM MRN: [id number] Endoscopist: Lorin Picket E. Tomasa Rand , MD, [id number] Age: 67 Referring MD:  Date of Birth: Mar 26, 1956 Gender: Male Account #: [id number] Procedure:                Colonoscopy Indications:              Screening patient at increased risk: Family history                            of 1st-degree relative with colorectal cancer at                            age 47 years (or older); last colonoscopy 2017 with                            no precancerous polyps Medicines:                Monitored Anesthesia Care Procedure:                Pre-Anesthesia Assessment:                           - Prior to the procedure, a History and Physical                            was performed, and patient medications and                            allergies were reviewed. The patient's tolerance of                            previous anesthesia was also reviewed. The risks                            and benefits of the procedure and the sedation                            options and risks were discussed with the patient.                            All questions were answered, and informed consent                            was obtained. Prior Anticoagulants: The patient has                            taken no anticoagulant or antiplatelet agents. ASA                            Grade Assessment: III - A patient with severe                            systemic disease. After reviewing the risks and  benefits, the patient was deemed in satisfactory                            condition to undergo the procedure.                           After obtaining informed consent, the colonoscope                            was passed under direct vision. Throughout the                            procedure, the patient's blood pressure, pulse, and                            oxygen saturations were  monitored continuously. The                            CF [id number] #[id number] was introduced through the anus                            and advanced to the the cecum, identified by                            appendiceal orifice and ileocecal valve. The                            colonoscopy was performed without difficulty. The                            patient tolerated the procedure well. The quality                            of the bowel preparation was adequate. The                            ileocecal valve, appendiceal orifice, and rectum                            were photographed. The bowel preparation used was                            Miralax and SUPREP via extended prep with split                            dose instruction. Scope In: 8:48:55 AM Scope Out: 9:07:48 AM Scope Withdrawal Time: 0 hours 8 minutes 51 seconds  Total Procedure Duration: 0 hours 18 minutes 53 seconds  Findings:                 The perianal and digital rectal examinations were                            normal. Pertinent negatives include normal  sphincter tone and no palpable rectal lesions.                           The colon (entire examined portion) appeared normal.                           The retroflexed view of the distal rectum and anal                            verge was normal and showed no anal or rectal                            abnormalities. Complications:            No immediate complications. Estimated Blood Loss:     Estimated blood loss: none. Impression:               - The entire examined colon is normal.                           - The distal rectum and anal verge are normal on                            retroflexion view.                           - No specimens collected. Recommendation:           - Patient has a contact number available for                            emergencies. The signs and symptoms of potential                            delayed  complications were discussed with the                            patient. Return to normal activities tomorrow.                            Written discharge instructions were provided to the                            patient.                           - Resume previous diet.                           - Continue present medications.                           - Repeat colonoscopy in 10 years for screening                            purposes. Kadee Philyaw E. Tomasa Rand, MD 03/27/2023 9:14:35 AM This report has been signed electronically.

## 2023-03-28 ENCOUNTER — Ambulatory Visit: Payer: BC Managed Care – PPO | Admitting: Internal Medicine

## 2023-03-28 ENCOUNTER — Telehealth: Payer: Self-pay | Admitting: *Deleted

## 2023-03-28 NOTE — Telephone Encounter (Signed)
Post procedure follow up call placed, no answer and left VM.  

## 2023-03-29 ENCOUNTER — Telehealth: Payer: Self-pay

## 2023-03-29 NOTE — Telephone Encounter (Signed)
PA initiated via Covermymeds; KEY: [id number] . Awaiting determination.

## 2023-03-29 NOTE — Telephone Encounter (Signed)
PA approved.

## 2023-04-30 IMAGING — DX DG CHEST 2V
2 series · 2 of 2 positions shown · non-contrast
Comparison: None.

CLINICAL DATA: Fever, cough, and shortness of breath for the past
week.

EXAM:
CHEST - 2 VIEW

[chest pa]
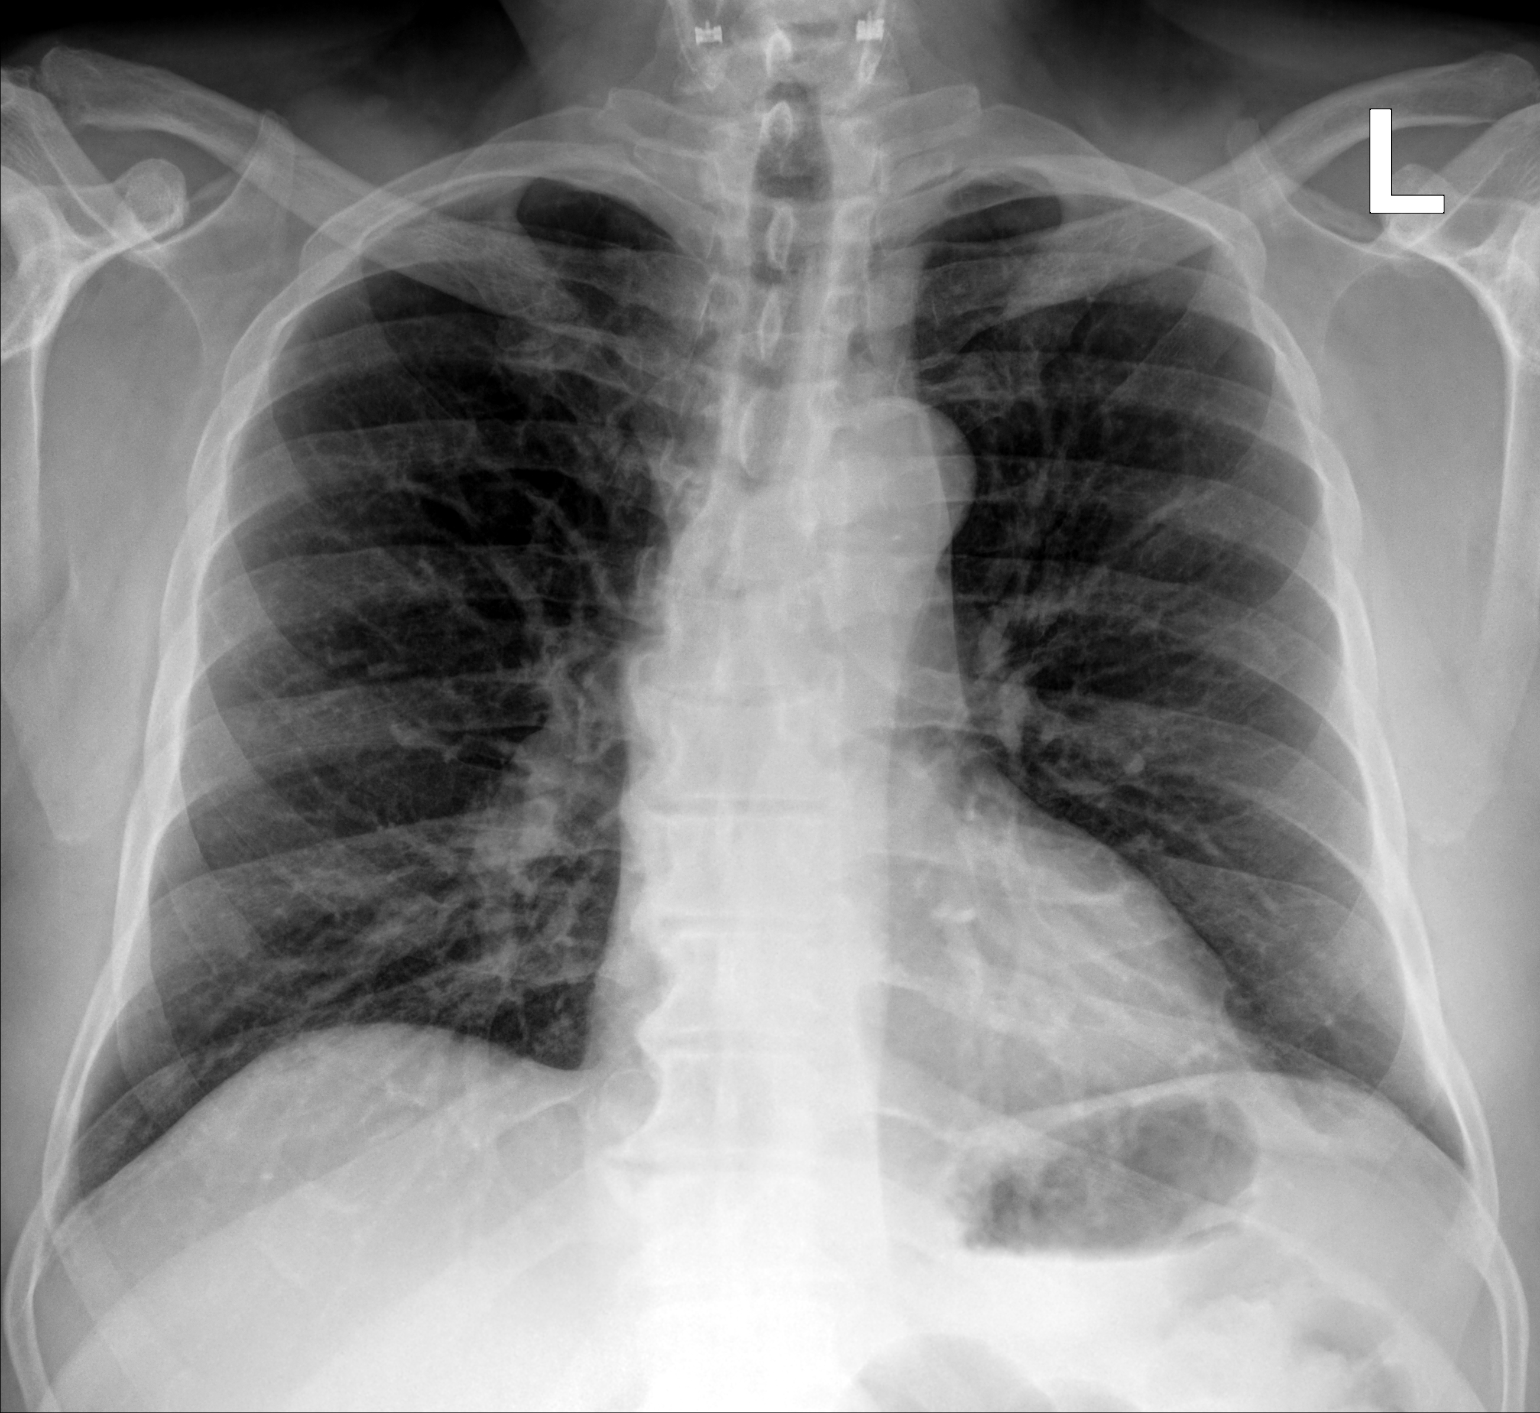

[chest lat]
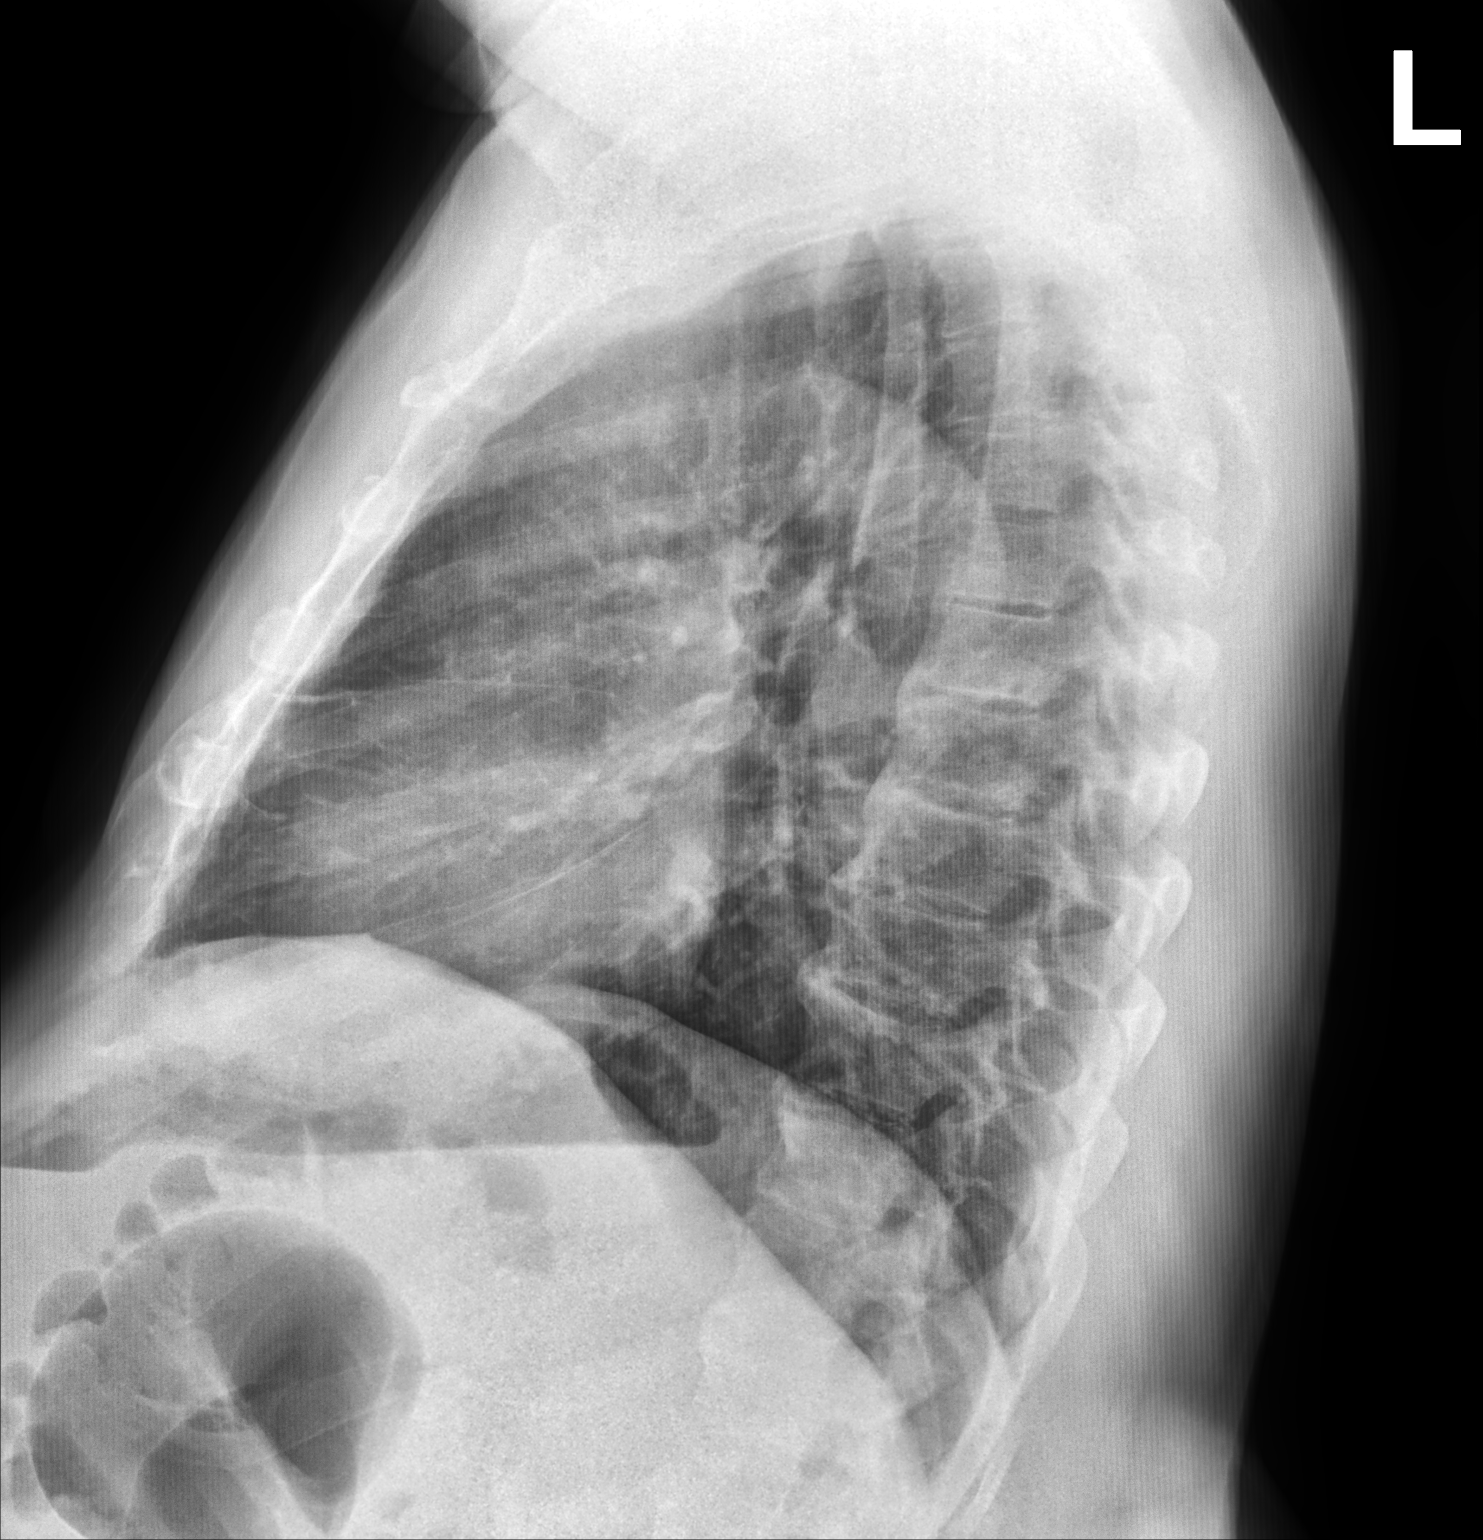

[2 of 2 positions shown; findings below may reference images not displayed]

FINDINGS: The heart size and mediastinal contours are within normal limits.
Both lungs are clear. The visualized skeletal structures are
unremarkable.
IMPRESSION: No active cardiopulmonary disease.

## 2023-05-15 ENCOUNTER — Ambulatory Visit: Payer: Medicare Other | Admitting: Internal Medicine

## 2023-05-16 ENCOUNTER — Encounter: Payer: Self-pay | Admitting: Internal Medicine

## 2023-05-16 ENCOUNTER — Other Ambulatory Visit: Payer: Self-pay | Admitting: Internal Medicine

## 2023-05-26 ENCOUNTER — Other Ambulatory Visit: Payer: Self-pay | Admitting: Internal Medicine

## 2023-05-28 ENCOUNTER — Other Ambulatory Visit: Payer: Self-pay | Admitting: Internal Medicine

## 2023-06-10 ENCOUNTER — Other Ambulatory Visit: Payer: Self-pay | Admitting: Internal Medicine

## 2023-06-14 ENCOUNTER — Other Ambulatory Visit: Payer: Self-pay | Admitting: Internal Medicine

## 2023-06-15 NOTE — Telephone Encounter (Signed)
Tried to call pt. I could hear him, but he could not hear me. Tried to call him back and it went straight to vm several times.

## 2023-06-15 NOTE — Telephone Encounter (Signed)
Pt returned call would like a call back #[phone]

## 2023-06-15 NOTE — Telephone Encounter (Signed)
Spoke to pt. He said he is doing well on the 7.5 mg and wants to move up. He also was saying he is having an increase in his acid reflux. He was supposed to have been seen in late July. Made him an OV 06-26-23 to discuss the acid reflux and do his 2 month follow-up.

## 2023-06-17 ENCOUNTER — Other Ambulatory Visit: Payer: Self-pay | Admitting: Internal Medicine

## 2023-06-26 ENCOUNTER — Ambulatory Visit: Payer: Medicare Other | Admitting: Internal Medicine

## 2023-07-11 ENCOUNTER — Encounter: Payer: Self-pay | Admitting: Internal Medicine

## 2023-07-11 ENCOUNTER — Ambulatory Visit (INDEPENDENT_AMBULATORY_CARE_PROVIDER_SITE_OTHER): Payer: BC Managed Care – PPO | Admitting: Internal Medicine

## 2023-07-11 VITALS — BP 138/86 | HR 91 | Temp 97.8°F | Ht 65.5 in | Wt 197.0 lb

## 2023-07-11 DIAGNOSIS — N6313 Unspecified lump in the right breast, lower outer quadrant: Secondary | ICD-10-CM

## 2023-07-11 DIAGNOSIS — N6310 Unspecified lump in the right breast, unspecified quadrant: Secondary | ICD-10-CM | POA: Insufficient documentation

## 2023-07-11 DIAGNOSIS — E1140 Type 2 diabetes mellitus with diabetic neuropathy, unspecified: Secondary | ICD-10-CM | POA: Diagnosis not present

## 2023-07-11 DIAGNOSIS — I10 Essential (primary) hypertension: Secondary | ICD-10-CM

## 2023-07-11 DIAGNOSIS — Z7984 Long term (current) use of oral hypoglycemic drugs: Secondary | ICD-10-CM

## 2023-07-11 DIAGNOSIS — Z7985 Long-term (current) use of injectable non-insulin antidiabetic drugs: Secondary | ICD-10-CM | POA: Insufficient documentation

## 2023-07-11 DIAGNOSIS — N1831 Chronic kidney disease, stage 3a: Secondary | ICD-10-CM

## 2023-07-11 DIAGNOSIS — R051 Acute cough: Secondary | ICD-10-CM

## 2023-07-11 DIAGNOSIS — E119 Type 2 diabetes mellitus without complications: Secondary | ICD-10-CM | POA: Insufficient documentation

## 2023-07-11 LAB — POCT GLYCOSYLATED HEMOGLOBIN (HGB A1C): Hemoglobin A1C: 6.7 % — AB (ref 4.0–5.6)

## 2023-07-11 LAB — HM DIABETES FOOT EXAM

## 2023-07-11 MED ORDER — MONTELUKAST SODIUM 10 MG PO TABS
10.0000 mg | ORAL_TABLET | Freq: Every day | ORAL | 3 refills | Status: DC
Start: 2023-07-11 — End: 2024-03-11

## 2023-07-11 NOTE — Assessment & Plan Note (Signed)
Now following with Dr Suezanne Jacquet

## 2023-07-11 NOTE — Patient Instructions (Signed)
Please stop the spironolactone. Keep an eye on your blood pressure and let me know if it is consistently over 140/90. Let me know if the breast mass doesn't improve in the next couple of weeks--we will go ahead with a mammmogram

## 2023-07-11 NOTE — Assessment & Plan Note (Signed)
A1c much better on the mounjaro 10mg  weekly---down to 6.7%  On glipizide 10 bid, farxiga 5, metformin 1000 daily

## 2023-07-11 NOTE — Progress Notes (Signed)
Subjective:    Patient ID: Randall Pace, male    DOB: 05-08-1956, 67 y.o.   MRN: [id number]  HPI Here with concerns about a breast lump--and follow up on diabetes  Missed follow up due to mom's illness---has been in the hospital Has been on the mounjaro since February--but missed last dose (something with cost) Not checking sugars  Can feel something hard in right breast Some pain---and definitely tender At least one month No nipple discharge  No chest pain No SOB Still not exercising No edema No palpitations  Has seen nephrology Had good visits  Current Outpatient Medications on File Prior to Visit  Medication Sig Dispense Refill   aspirin 81 MG tablet Take 81 mg by mouth daily.     atorvastatin (LIPITOR) 40 MG tablet TAKE 1 TABLET BY MOUTH EVERY DAY 30 tablet 0   cetirizine (ZYRTEC ALLERGY) 10 MG tablet Take 1 tablet (10 mg total) by mouth daily. 30 tablet 6   FARXIGA 5 MG TABS tablet TAKE 1 TABLET BY MOUTH DAILY BEFORE BREAKFAST. 90 tablet 3   fenofibrate 54 MG tablet TAKE 1 TABLET(54 MG) BY MOUTH DAILY 30 tablet 0   gabapentin (NEURONTIN) 300 MG capsule Take 300 mg by mouth 2 (two) times daily.     glipiZIDE (GLUCOTROL) 10 MG tablet TAKE 1 TABLET (10 MG TOTAL) BY MOUTH TWICE A DAY BEFORE A MEAL 180 tablet 3   ipratropium (ATROVENT) 0.06 % nasal spray PLACE 2 SPRAYS IN EACH NOSTRIL TWICE A DAY 45 mL 1   metFORMIN (GLUCOPHAGE) 1000 MG tablet TAKE 1 TABLET (1,000 MG TOTAL) BY MOUTH DAILY WITH BREAKFAST. PT TAKING 1 A DAY 90 tablet 0   omeprazole (PRILOSEC) 20 MG capsule TAKE 1 CAPSULE BY MOUTH EVERY DAY 90 capsule 0   sildenafil (VIAGRA) 100 MG tablet TAKE 1/2 TO 1 TABLET(50 TO 100 MG) BY MOUTH DAILY AS NEEDED FOR ERECTILE DYSFUNCTION 10 tablet 11   spironolactone (ALDACTONE) 25 MG tablet Take 1 tablet (25 mg total) by mouth daily. 90 tablet 3   tirzepatide (MOUNJARO) 10 MG/0.5ML Pen Inject 10 mg into the skin once a week. 6 mL 4   TURMERIC PO Take 1 tablet by mouth  daily.     valsartan-hydrochlorothiazide (DIOVAN-HCT) 320-25 MG tablet TAKE 1 TABLET BY MOUTH EVERY DAY 90 tablet 3   verapamil (VERELAN PM) 360 MG 24 hr capsule Take 1 capsule (360 mg total) by mouth at bedtime. 90 capsule 3   fexofenadine (ALLEGRA) 180 MG tablet Take 180 mg by mouth daily. (Patient not taking: Reported on 07/11/2023)     montelukast (SINGULAIR) 10 MG tablet TAKE 1 TABLET(10 MG) BY MOUTH AT BEDTIME (Patient not taking: Reported on 03/14/2023) 90 tablet 1   No current facility-administered medications on file prior to visit.    No Known Allergies  Past Medical History:  Diagnosis Date   Arthritis    low back   Diabetes mellitus    GERD (gastroesophageal reflux disease)    History of chicken pox    Hypertension    Migraine    Patellofemoral pain syndrome    Sleep apnea     Past Surgical History:  Procedure Laterality Date   NASAL SINUS SURGERY     palatoplasts     TONSILLECTOMY     uvoloplasty      Family History  Problem Relation Age of Onset   Dementia Mother    Prostate cancer Father    Colon cancer Father    Leukemia  Father    Diabetes Father    Esophageal cancer Neg Hx    Stomach cancer Neg Hx    Rectal cancer Neg Hx     Social History   Socioeconomic History   Marital status: Married    Spouse name: Not on file   Number of children: 2   Years of education: Not on file   Highest education level: Not on file  Occupational History   Occupation: Investment banker, corporate: [employer]  Tobacco Use   Smoking status: Never    Passive exposure: Past   Smokeless tobacco: Never  Vaping Use   Vaping status: Never Used  Substance and Sexual Activity   Alcohol use: Yes    Comment: occasional   Drug use: No   Sexual activity: Yes  Other Topics Concern   Not on file  Social History Narrative   No living will   Wife should be health care POA   Would accept resuscitation   Not sure about tube feeds   Social Determinants of Health   Financial  Resource Strain: Not on file  Food Insecurity: Not on file  Transportation Needs: Not on file  Physical Activity: Not on file  Stress: Not on file  Social Connections: Not on file  Intimate Partner Violence: Not on file   Review of Systems Still not sleeping well No N/V Bowels are better than in the past--but still not great     Objective:   Physical Exam Constitutional:      Appearance: Normal appearance.  Cardiovascular:     Rate and Rhythm: Normal rate and regular rhythm.     Pulses: Normal pulses.     Heart sounds:     No gallop.     Comments: Gr 3/6 coarse systolic murmur Pulmonary:     Effort: Pulmonary effort is normal.     Breath sounds: Normal breath sounds. No wheezing or rales.  Genitourinary:    Comments: Tender breast mass on right ---just below and lateral to areola Musculoskeletal:     Cervical back: Neck supple.     Right lower leg: No edema.     Left lower leg: No edema.  Lymphadenopathy:     Cervical: No cervical adenopathy.  Skin:    Comments: No foot lesions  Neurological:     Mental Status: He is alert.     Comments: Decreased sensation in feet            Assessment & Plan:

## 2023-07-11 NOTE — Assessment & Plan Note (Signed)
BP Readings from Last 3 Encounters:  07/11/23 138/86  03/27/23 112/60  03/15/23 130/74   Better now Will have to see how he does off the sprionolactone Continue valsartan/hydrochlorothiazide 320/25 and verapamil 360

## 2023-07-11 NOTE — Assessment & Plan Note (Signed)
Likely from the spironolactone Will stop this and if not improved within a few weeks--will proceed with mammogram

## 2023-07-23 ENCOUNTER — Telehealth: Payer: Self-pay | Admitting: Internal Medicine

## 2023-07-23 DIAGNOSIS — N6313 Unspecified lump in the right breast, lower outer quadrant: Secondary | ICD-10-CM

## 2023-07-23 NOTE — Telephone Encounter (Signed)
Pt called requesting a referral to a oncologist for the lump in his chest? Pt states him & his wife believes the lump has gotten larger. Call back # [phone]

## 2023-07-24 ENCOUNTER — Other Ambulatory Visit: Payer: Self-pay | Admitting: Internal Medicine

## 2023-07-24 DIAGNOSIS — N6313 Unspecified lump in the right breast, lower outer quadrant: Secondary | ICD-10-CM

## 2023-07-24 NOTE — Telephone Encounter (Signed)
Tried to call pt. VM is full ?

## 2023-07-24 NOTE — Telephone Encounter (Signed)
Spoke to pt. He wants to go to [place].

## 2023-07-24 NOTE — Addendum Note (Signed)
Addended by: Eual Fines on: 07/24/2023 02:51 PM   Modules accepted: Orders

## 2023-07-24 NOTE — Telephone Encounter (Signed)
Spoke to pt. My apologies. I said Randall Pace when he really wanted Randall Pace. I corrected the order for [facility]. He will call them to schedule.

## 2023-07-24 NOTE — Telephone Encounter (Signed)
Order is in He can call [facility] radiology to schedule at his convenience

## 2023-08-06 ENCOUNTER — Other Ambulatory Visit: Payer: Self-pay | Admitting: Internal Medicine

## 2023-08-06 MED ORDER — FENOFIBRATE 54 MG PO TABS: ORAL_TABLET | ORAL | 0 refills | Status: DC

## 2023-08-06 NOTE — Addendum Note (Signed)
Addended by: Eual Fines on: 08/06/2023 03:40 PM   Modules accepted: Orders

## 2023-08-09 ENCOUNTER — Ambulatory Visit
Admission: RE | Admit: 2023-08-09 | Discharge: 2023-08-09 | Disposition: A | Discharge status: Home / Self Care | Payer: BC Managed Care – PPO | Source: Ambulatory Visit | Attending: Specialist | Admitting: Specialist

## 2023-08-09 ENCOUNTER — Ambulatory Visit: Payer: BC Managed Care – PPO

## 2023-08-09 ENCOUNTER — Other Ambulatory Visit: Payer: BC Managed Care – PPO

## 2023-08-09 DIAGNOSIS — N62 Hypertrophy of breast: Secondary | ICD-10-CM | POA: Diagnosis not present

## 2023-08-09 DIAGNOSIS — N6313 Unspecified lump in the right breast, lower outer quadrant: Secondary | ICD-10-CM

## 2023-08-11 ENCOUNTER — Other Ambulatory Visit: Payer: Self-pay | Admitting: Internal Medicine

## 2023-10-10 ENCOUNTER — Ambulatory Visit: Payer: BC Managed Care – PPO | Admitting: Internal Medicine

## 2023-10-11 ENCOUNTER — Encounter: Payer: Self-pay | Admitting: Internal Medicine

## 2023-10-12 DIAGNOSIS — H52223 Regular astigmatism, bilateral: Secondary | ICD-10-CM | POA: Diagnosis not present

## 2023-10-12 DIAGNOSIS — E113293 Type 2 diabetes mellitus with mild nonproliferative diabetic retinopathy without macular edema, bilateral: Secondary | ICD-10-CM | POA: Diagnosis not present

## 2023-10-12 DIAGNOSIS — H524 Presbyopia: Secondary | ICD-10-CM | POA: Diagnosis not present

## 2023-10-12 DIAGNOSIS — H5213 Myopia, bilateral: Secondary | ICD-10-CM | POA: Diagnosis not present

## 2023-10-12 LAB — HM DIABETES EYE EXAM

## 2023-10-13 ENCOUNTER — Other Ambulatory Visit: Payer: Self-pay | Admitting: Internal Medicine

## 2023-11-16 ENCOUNTER — Other Ambulatory Visit: Payer: Self-pay | Admitting: Internal Medicine

## 2023-12-03 ENCOUNTER — Encounter: Payer: Self-pay | Admitting: Internal Medicine

## 2023-12-03 ENCOUNTER — Ambulatory Visit: Payer: BC Managed Care – PPO | Admitting: Internal Medicine

## 2023-12-03 VITALS — BP 138/80 | HR 82 | Temp 97.9°F | Ht 65.75 in | Wt 192.0 lb

## 2023-12-03 DIAGNOSIS — Z0000 Encounter for general adult medical examination without abnormal findings: Secondary | ICD-10-CM | POA: Diagnosis not present

## 2023-12-03 DIAGNOSIS — Z7985 Long-term (current) use of injectable non-insulin antidiabetic drugs: Secondary | ICD-10-CM | POA: Diagnosis not present

## 2023-12-03 DIAGNOSIS — N1831 Chronic kidney disease, stage 3a: Secondary | ICD-10-CM | POA: Diagnosis not present

## 2023-12-03 DIAGNOSIS — Z125 Encounter for screening for malignant neoplasm of prostate: Secondary | ICD-10-CM | POA: Diagnosis not present

## 2023-12-03 DIAGNOSIS — I350 Nonrheumatic aortic (valve) stenosis: Secondary | ICD-10-CM

## 2023-12-03 DIAGNOSIS — E1140 Type 2 diabetes mellitus with diabetic neuropathy, unspecified: Secondary | ICD-10-CM | POA: Diagnosis not present

## 2023-12-03 DIAGNOSIS — Z1159 Encounter for screening for other viral diseases: Secondary | ICD-10-CM

## 2023-12-03 DIAGNOSIS — Z7984 Long term (current) use of oral hypoglycemic drugs: Secondary | ICD-10-CM | POA: Diagnosis not present

## 2023-12-03 DIAGNOSIS — G4733 Obstructive sleep apnea (adult) (pediatric): Secondary | ICD-10-CM

## 2023-12-03 DIAGNOSIS — E119 Type 2 diabetes mellitus without complications: Secondary | ICD-10-CM

## 2023-12-03 DIAGNOSIS — I10 Essential (primary) hypertension: Secondary | ICD-10-CM

## 2023-12-03 LAB — CBC
HCT: 39.8 % (ref 39.0–52.0)
Hemoglobin: 13.1 g/dL (ref 13.0–17.0)
MCHC: 33 g/dL (ref 30.0–36.0)
MCV: 90.3 fL (ref 78.0–100.0)
Platelets: 279 10*3/uL (ref 150.0–400.0)
RBC: 4.41 Mil/uL (ref 4.22–5.81)
RDW: 13.9 % (ref 11.5–15.5)
WBC: 5.6 10*3/uL (ref 4.0–10.5)

## 2023-12-03 LAB — LIPID PANEL
Cholesterol: 166 mg/dL (ref 0–200)
HDL: 42.5 mg/dL (ref >39.00)
LDL Cholesterol: 103 mg/dL — ABNORMAL HIGH (ref 0–99)
NonHDL: 123.99
Total CHOL/HDL Ratio: 4
Triglycerides: 106 mg/dL (ref 0.0–149.0)
VLDL: 21.2 mg/dL (ref 0.0–40.0)

## 2023-12-03 LAB — HEPATIC FUNCTION PANEL
ALT: 12 U/L (ref 0–53)
AST: 13 U/L (ref 0–37)
Albumin: 4.4 g/dL (ref 3.5–5.2)
Alkaline Phosphatase: 63 U/L (ref 39–117)
Bilirubin, Direct: 0.1 mg/dL (ref 0.0–0.3)
Total Bilirubin: 0.3 mg/dL (ref 0.2–1.2)
Total Protein: 6.9 g/dL (ref 6.0–8.3)

## 2023-12-03 LAB — RENAL FUNCTION PANEL
Albumin: 4.4 g/dL (ref 3.5–5.2)
BUN: 18 mg/dL (ref 6–23)
CO2: 28 meq/L (ref 19–32)
Calcium: 9.8 mg/dL (ref 8.4–10.5)
Chloride: 104 meq/L (ref 96–112)
Creatinine, Ser: 1.32 mg/dL (ref 0.40–1.50)
GFR: 56.05 mL/min — ABNORMAL LOW (ref >60.00)
Glucose, Bld: 122 mg/dL — ABNORMAL HIGH (ref 70–99)
Phosphorus: 2.7 mg/dL (ref 2.3–4.6)
Potassium: 4.3 meq/L (ref 3.5–5.1)
Sodium: 140 meq/L (ref 135–145)

## 2023-12-03 LAB — PSA: PSA: 2.99 ng/mL (ref 0.10–4.00)

## 2023-12-03 LAB — MICROALBUMIN / CREATININE URINE RATIO
Creatinine,U: 71 mg/dL
Microalb Creat Ratio: 67.3 mg/g — ABNORMAL HIGH (ref 0.0–30.0)
Microalb, Ur: 47.8 mg/dL — ABNORMAL HIGH (ref 0.0–1.9)

## 2023-12-03 LAB — HEMOGLOBIN A1C: Hgb A1c MFr Bld: 6.7 % — ABNORMAL HIGH (ref 4.6–6.5)

## 2023-12-03 MED ORDER — TADALAFIL 20 MG PO TABS: 10.0000 mg | ORAL_TABLET | ORAL | 11 refills | Status: DC | PRN

## 2023-12-03 NOTE — Assessment & Plan Note (Signed)
Needs sleep evaluation again

## 2023-12-03 NOTE — Assessment & Plan Note (Signed)
Hopefully still acceptable control If over 7.5%--will increase the mounjaro (and needs to take the miralax for constipation) On farxiga 5, metformin 1000 daily, glipizide 10 bid

## 2023-12-03 NOTE — Assessment & Plan Note (Signed)
Was better last time Will recheck again

## 2023-12-03 NOTE — Assessment & Plan Note (Signed)
No symptoms Should have repeat echo in the next year or so

## 2023-12-03 NOTE — Assessment & Plan Note (Signed)
BP Readings from Last 3 Encounters:  12/03/23 138/80  07/11/23 138/86  03/27/23 112/60   Okay on valsartan/hydrochlorothiazide 320/25, verapamil 360 Okay off the spironolactone

## 2023-12-03 NOTE — Progress Notes (Signed)
Subjective:    Patient ID: Randall Pace, male    DOB: 18-Sep-1956, 68 y.o.   MRN: [id number]  HPI Here for physical  Still doesn't sleep well Stays tired during the day---sleep pressure when driving He used to have CPAP--known apnea  Breast mass did go away with stopping spironolactone Occasionally takes BP Yesterday felt hot--and was 160/90  Not checking sugars much Does slip with dietary compliance---overall thinks he is doing okay Appetite is down on the mounjaro Does have constipation--predates the mounjaro Hasn't been taking the miralax/senna that I recommended   GFR low but better last time at Dr Bennie Dallas  Current Outpatient Medications on File Prior to Visit  Medication Sig Dispense Refill   aspirin 81 MG tablet Take 81 mg by mouth daily.     atorvastatin (LIPITOR) 40 MG tablet TAKE 1 TABLET BY MOUTH EVERY DAY 30 tablet 0   FARXIGA 5 MG TABS tablet TAKE 1 TABLET BY MOUTH DAILY BEFORE BREAKFAST. 90 tablet 3   fenofibrate 54 MG tablet TAKE 1 TABLET(54 MG) BY MOUTH DAILY 90 tablet 1   gabapentin (NEURONTIN) 300 MG capsule Take 300 mg by mouth 2 (two) times daily.     glipiZIDE (GLUCOTROL) 10 MG tablet TAKE 1 TABLET (10 MG TOTAL) BY MOUTH TWICE A DAY BEFORE A MEAL 180 tablet 0   metFORMIN (GLUCOPHAGE) 1000 MG tablet TAKE 1 TABLET (1,000 MG TOTAL) BY MOUTH DAILY WITH BREAKFAST. PT TAKING 1 A DAY 90 tablet 1   montelukast (SINGULAIR) 10 MG tablet Take 1 tablet (10 mg total) by mouth at bedtime. 90 tablet 3   omeprazole (PRILOSEC) 20 MG capsule TAKE 1 CAPSULE BY MOUTH EVERY DAY 90 capsule 0   tirzepatide (MOUNJARO) 10 MG/0.5ML Pen Inject 10 mg into the skin once a week. 6 mL 4   valsartan-hydrochlorothiazide (DIOVAN-HCT) 320-25 MG tablet TAKE 1 TABLET BY MOUTH EVERY DAY 90 tablet 3   verapamil (VERELAN PM) 360 MG 24 hr capsule Take 1 capsule (360 mg total) by mouth at bedtime. 90 capsule 3   cetirizine (ZYRTEC ALLERGY) 10 MG tablet Take 1 tablet (10 mg total) by mouth  daily. (Patient not taking: Reported on 12/03/2023) 30 tablet 6   fexofenadine (ALLEGRA) 180 MG tablet Take 180 mg by mouth daily. (Patient not taking: Reported on 12/03/2023)     ipratropium (ATROVENT) 0.06 % nasal spray PLACE 2 SPRAYS IN EACH NOSTRIL TWICE A DAY (Patient not taking: Reported on 12/03/2023) 45 mL 1   TURMERIC PO Take 1 tablet by mouth daily. (Patient not taking: Reported on 12/03/2023)     No current facility-administered medications on file prior to visit.    No Known Allergies  Past Medical History:  Diagnosis Date   Arthritis    low back   Diabetes mellitus    GERD (gastroesophageal reflux disease)    History of chicken pox    Hypertension    Migraine    Patellofemoral pain syndrome    Sleep apnea     Past Surgical History:  Procedure Laterality Date   NASAL SINUS SURGERY     palatoplasts     TONSILLECTOMY     uvoloplasty      Family History  Problem Relation Age of Onset   Dementia Mother    Prostate cancer Father    Colon cancer Father    Leukemia Father    Diabetes Father    Esophageal cancer Neg Hx    Stomach cancer Neg Hx    Rectal cancer  Neg Hx     Social History   Socioeconomic History   Marital status: Married    Spouse name: Not on file   Number of children: 2   Years of education: Not on file   Highest education level: Not on file  Occupational History   Occupation: Investment banker, corporate: [employer]  Tobacco Use   Smoking status: Never    Passive exposure: Past   Smokeless tobacco: Never  Vaping Use   Vaping status: Never Used  Substance and Sexual Activity   Alcohol use: Yes    Comment: occasional   Drug use: No   Sexual activity: Yes  Other Topics Concern   Not on file  Social History Narrative   No living will   Wife should be health care POA   Would accept resuscitation   Not sure about tube feeds   Social Drivers of Health   Financial Resource Strain: Not on file  Food Insecurity: Not on file  Transportation Needs: Not  on file  Physical Activity: Not on file  Stress: Not on file  Social Connections: Not on file  Intimate Partner Violence: Not on file   Review of Systems  Constitutional:  Positive for fatigue. Negative for unexpected weight change.       Wears seat belt  HENT:  Positive for hearing loss. Negative for dental problem and tinnitus.   Eyes:        Having some vision trouble---glasses not helping as much---needs to check in with eye doctor  Respiratory:         Feels cough and bronchial irritation Chronic dyspnea---no exercise  Cardiovascular:  Negative for chest pain, palpitations and leg swelling.  Gastrointestinal:  Positive for constipation. Negative for blood in stool.       No heartburn on the omeprazole  Endocrine: Negative for polyuria.  Genitourinary:  Positive for frequency and urgency. Negative for difficulty urinating.  Musculoskeletal:  Positive for back pain and neck pain. Negative for joint swelling.  Skin:  Negative for rash.  Allergic/Immunologic: Positive for environmental allergies. Negative for immunocompromised state.       Uses allegra prn---seasonal  Neurological:  Positive for dizziness. Negative for syncope and light-headedness.       Still with balance issues--vestibular Some recent headaches Still with hand/feet tingling  Hematological:  Negative for adenopathy. Does not bruise/bleed easily.  Psychiatric/Behavioral:  Positive for sleep disturbance. Negative for dysphoric mood. The patient is not nervous/anxious.        Objective:   Physical Exam Constitutional:      Appearance: Normal appearance.  HENT:     Mouth/Throat:     Pharynx: No oropharyngeal exudate or posterior oropharyngeal erythema.  Eyes:     Conjunctiva/sclera: Conjunctivae normal.     Pupils: Pupils are equal, round, and reactive to light.  Cardiovascular:     Rate and Rhythm: Normal rate and regular rhythm.     Pulses: Normal pulses.     Heart sounds:     No gallop.     Comments:  Gr 3/6 systolic murmur Pulmonary:     Effort: Pulmonary effort is normal.     Breath sounds: Normal breath sounds. No wheezing or rales.  Abdominal:     Palpations: Abdomen is soft.     Tenderness: There is no abdominal tenderness.  Musculoskeletal:     Cervical back: Neck supple.     Right lower leg: No edema.     Left lower leg: No edema.  Lymphadenopathy:     Cervical: No cervical adenopathy.  Skin:    Findings: No lesion or rash.     Comments: No foot lesions  Neurological:     General: No focal deficit present.     Mental Status: He is alert and oriented to person, place, and time.     Comments: Decreased sensation in feet  Psychiatric:        Mood and Affect: Mood normal.        Behavior: Behavior normal.            Assessment & Plan:

## 2023-12-03 NOTE — Assessment & Plan Note (Signed)
Healthy but out of shape Colonoscopy due 2034 Will check PSA Still prefers no immunizations

## 2023-12-04 LAB — HEPATITIS C ANTIBODY: Hepatitis C Ab: NONREACTIVE

## 2023-12-05 ENCOUNTER — Other Ambulatory Visit (HOSPITAL_COMMUNITY): Payer: Self-pay

## 2023-12-05 ENCOUNTER — Telehealth (HOSPITAL_COMMUNITY): Payer: Self-pay

## 2023-12-05 NOTE — Telephone Encounter (Signed)
 Pharmacy Patient Advocate Encounter  Received notification from [facility] that Prior Authorization for Tadalafil  20 mg tablets has been APPROVED from 12/05/23 to 12/04/26. Ran test claim, Copay is $0. This test claim was processed through [facility]- copay amounts may vary at other pharmacies due to pharmacy/plan contracts, or as the patient moves through the different stages of their insurance plan.   PA #/Case ID/Reference #: [id number]

## 2023-12-05 NOTE — Telephone Encounter (Signed)
 Pharmacy Patient Advocate Encounter   Received notification from CoverMyMeds that prior authorization for  Tadalafil  20MG  tabletsis required/requested.   Insurance verification completed.   The patient is insured through [facility] .   Per test claim: PA required; PA started via CoverMyMeds. KEY [id number] . Waiting for clinical questions to populate.

## 2023-12-12 DIAGNOSIS — N1831 Chronic kidney disease, stage 3a: Secondary | ICD-10-CM | POA: Diagnosis not present

## 2023-12-12 DIAGNOSIS — E785 Hyperlipidemia, unspecified: Secondary | ICD-10-CM | POA: Diagnosis not present

## 2023-12-12 DIAGNOSIS — G4733 Obstructive sleep apnea (adult) (pediatric): Secondary | ICD-10-CM | POA: Diagnosis not present

## 2023-12-12 DIAGNOSIS — E1122 Type 2 diabetes mellitus with diabetic chronic kidney disease: Secondary | ICD-10-CM | POA: Diagnosis not present

## 2024-01-09 ENCOUNTER — Other Ambulatory Visit: Payer: Self-pay | Admitting: Internal Medicine

## 2024-01-15 ENCOUNTER — Other Ambulatory Visit: Payer: Self-pay | Admitting: Internal Medicine

## 2024-01-21 ENCOUNTER — Other Ambulatory Visit: Payer: Self-pay | Admitting: Internal Medicine

## 2024-01-24 ENCOUNTER — Ambulatory Visit: Payer: BC Managed Care – PPO | Admitting: Urology

## 2024-01-29 ENCOUNTER — Other Ambulatory Visit: Payer: Self-pay | Admitting: Internal Medicine

## 2024-02-12 ENCOUNTER — Ambulatory Visit (INDEPENDENT_AMBULATORY_CARE_PROVIDER_SITE_OTHER): Payer: BC Managed Care – PPO | Admitting: Urology

## 2024-02-12 ENCOUNTER — Encounter: Payer: Self-pay | Admitting: Urology

## 2024-02-12 VITALS — BP 169/84 | HR 84 | Ht 66.0 in | Wt 192.2 lb

## 2024-02-12 DIAGNOSIS — N529 Male erectile dysfunction, unspecified: Secondary | ICD-10-CM | POA: Diagnosis not present

## 2024-02-12 DIAGNOSIS — R972 Elevated prostate specific antigen [PSA]: Secondary | ICD-10-CM | POA: Diagnosis not present

## 2024-02-12 DIAGNOSIS — K5900 Constipation, unspecified: Secondary | ICD-10-CM | POA: Diagnosis not present

## 2024-02-12 NOTE — Progress Notes (Signed)
 I,[name],acting as a scribe for Randall Scotland, MD.,have documented all relevant documentation on the behalf of Randall Scotland, MD,as directed by  Randall Scotland, MD while in the presence of Randall Scotland, MD.  02/12/2024 3:25 PM   Randall Pace July 19, 1956 [id number]  Referring provider: [name], MD [address] [place],  [place] [address]  Chief Complaint  Patient presents with   Elevated PSA    HPI: 68 year-old male with a personal history of elevated PSA and erectile dysfunction presents today for follow-up.   He was previously evaluated for groin pain in 2022.   His PSA has historically been in the one range but recently increased to 2.99, prompting today's visit.   He reports baseline urinary frequency and erectile dysfunction, for which he has tried Cialis without benefit. He has also tried Sildenafil without success. He is interested in exploring other treatment options, including injection therapy, which he previously found effective but costly.   He expresses concern about his PSA due to his father's history of prostate cancer, colon cancer, and leukemia.   He also reports chronic constipation, with bowel movements occurring every four days, and has not been taking stool softeners regularly. He is interested in managing his constipation with Colace and possibly Miralax.   He has a history of diastasis and a hernia, which he attributes to past exercise.   He has lost weight recently, now weighing 192 pounds, down from 244 pounds.  PSA Trend: 09/05/2018     1.17 09/30/2019     1.62 03/23/2021     1.39 11/30/2022       1.77 12/03/2023       2.99  PMH: Past Medical History:  Diagnosis Date   Arthritis    low back   Diabetes mellitus    GERD (gastroesophageal reflux disease)    History of chicken pox    Hypertension    Migraine    Patellofemoral pain syndrome    Sleep apnea     Surgical History: Past Surgical History:  Procedure  Laterality Date   NASAL SINUS SURGERY     palatoplasts     TONSILLECTOMY     uvoloplasty      Home Medications:  Allergies as of 02/12/2024   No Known Allergies      Medication List        Accurate as of February 12, 2024  3:25 PM. If you have any questions, ask your nurse or doctor.          aspirin 81 MG tablet Take 81 mg by mouth daily.   atorvastatin 40 MG tablet Commonly known as: LIPITOR TAKE 1 TABLET BY MOUTH EVERY DAY   cetirizine 10 MG tablet Commonly known as: ZyrTEC Allergy Take 1 tablet (10 mg total) by mouth daily.   Farxiga 5 MG Tabs tablet Generic drug: dapagliflozin propanediol TAKE 1 TABLET BY MOUTH DAILY BEFORE BREAKFAST.   fenofibrate 54 MG tablet TAKE 1 TABLET(54 MG) BY MOUTH DAILY   fexofenadine 180 MG tablet Commonly known as: ALLEGRA Take 180 mg by mouth daily.   gabapentin 300 MG capsule Commonly known as: NEURONTIN Take 300 mg by mouth 2 (two) times daily.   glipiZIDE 10 MG tablet Commonly known as: GLUCOTROL TAKE 1 TABLET (10 MG TOTAL) BY MOUTH TWICE A DAY BEFORE A MEAL   ipratropium 0.06 % nasal spray Commonly known as: ATROVENT PLACE 2 SPRAYS IN EACH NOSTRIL TWICE A DAY   metFORMIN 1000 MG tablet  Commonly known as: GLUCOPHAGE TAKE 1 TABLET (1,000 MG TOTAL) BY MOUTH DAILY WITH BREAKFAST. PT TAKING 1 A DAY   montelukast 10 MG tablet Commonly known as: SINGULAIR Take 1 tablet (10 mg total) by mouth at bedtime.   omeprazole 20 MG capsule Commonly known as: PRILOSEC TAKE 1 CAPSULE BY MOUTH EVERY DAY   tadalafil 20 MG tablet Commonly known as: CIALIS Take 0.5-1 tablets (10-20 mg total) by mouth every other day as needed for erectile dysfunction.   tirzepatide 10 MG/0.5ML Pen Commonly known as: MOUNJARO Inject 10 mg into the skin once a week.   TURMERIC PO Take 1 tablet by mouth daily.   valsartan-hydrochlorothiazide 320-25 MG tablet Commonly known as: DIOVAN-HCT TAKE 1 TABLET BY MOUTH EVERY DAY   verapamil 360 MG 24  hr capsule Commonly known as: VERELAN TAKE 1 CAPSULE (360 MG TOTAL) BY MOUTH AT BEDTIME.        Family History: Family History  Problem Relation Age of Onset   Dementia Mother    Prostate cancer Father    Colon cancer Father    Leukemia Father    Diabetes Father    Esophageal cancer Neg Hx    Stomach cancer Neg Hx    Rectal cancer Neg Hx     Social History:  reports that he has never smoked. He has been exposed to tobacco smoke. He has never used smokeless tobacco. He reports current alcohol use. He reports that he does not use drugs.   Physical Exam: BP (!) 169/84   Pulse 84   Ht 5\' 6"  (1.676 m)   Wt 192 lb 3.2 oz (87.2 kg)   BMI 31.02 kg/m   Constitutional:  Alert and oriented, No acute distress. HEENT:  AT, moist mucus membranes.  Trachea midline, no masses. GU: Slightly enlarged prostate with no nodules. Normal sphincter tone. Neurologic: Grossly intact, no focal deficits, moving all 4 extremities. Psychiatric: Normal mood and affect.  Assessment & Plan:    1. Elevated PSA  - We reviewed the implications of an elevated PSA and the uncertainty surrounding it. In general, a man's PSA increases with age and is produced by both normal and cancerous prostate tissue. Differential for elevated PSA is BPH, prostate cancer, infection, recent intercourse/ejaculation, prostate infarction, recent urethroscopic manipulation (foley placement/cystoscopy) and prostatitis. Management of an elevated PSA can include observation or prostate biopsy and wediscussed this in detail. We discussed that indications for prostate biopsy are defined by age and race specific PSA cutoffs as well as a PSA velocity of 0.75/year. - Currently 2.99, which is slightly elevated from his historical range. Plan to recheck the PSA in six months (from 12/2023)  to monitor for any significant changes.   2. Erectile Dysfunction  - Discussed the pathophysiology of erectile dysfunction today along with possible  contributing factors such as high blood pressure, high cholesterol, diabetes, etc. - Has tried Cialis and Sildenafil without success. Discussed optimizing medication use, including taking it on an empty stomach and allowing adequate time for it to work.   - Injection therapy was discussed as a potential option, with details provided on how it is administered and the associated costs. Informed about penile pumps and implants as alternative treatments. He will consider these options and contact the office if interested in pursuing injection therapy.  3. Constipation - Recommended daily use of a stool softener, Colace, and advised increasing water intake. Miralax was suggested to use if he experiences two days without a bowel movement.  Return  in about 4 months (around 06/13/2024) for PSA.  I have reviewed the above documentation for accuracy and completeness, and I agree with the above.   Dustin Gimenez, MD   [facility] [address] [place], [place] [address] [phone]  I spent 33 total minutes on the day of the encounter including pre-visit review of the medical record, face-to-face time with the patient, and post visit ordering of labs/imaging/tests.

## 2024-02-12 NOTE — Patient Instructions (Addendum)
 Colace for chronic constipation       Trimix Injection Training  The provider will prescribe the Trimix medication to [facility].  You will need to pick up the medication at:   [address]  [place], [place] [address] [phone]  Once you have the medication in hand you will need to call our office to schedule a morning ONLY appointment to have injection teaching. The medication will induce an erection. The medication is estimated at $80.00. This medication is time sensitive as it expires quickly.   You will need to have a normal blood pressure in order to be injected. Your blood pressure must be under 140/90. We will check your blood pressure multiple times to ensure the reading is correct, if you BP continues to be elevated we will need to reschedule your appointment.    TRIMIX SELF-INJECTION INSTRUCTIONS    DETAILED PROCEDURE  1. GETTING SET UP  A. Proper hygiene is important. Wash your hands and keep the penis clean.  B. Assemble the following:  - Bottle of Trimix  - Alcohol pad  - Syringe  C. Keep the Trimix cold by returning the bottle to the refrigerator, or by placing the bottle in a cup of ice.   2. PREPARE THE SYRINGE  A. Wipe the rubber top of the vial with an alcohol pad.  B. After removing the cap of the needle, pull the plunger back to the desired dosage, filling this volume with air. Use a new needle and syringe each time.  C. Insert the needle through the rubber top and inject the air into the vial.  D. Turn the vial with needle and syringe inserted upside down. Pull back on the syringe plunger in a slow and steady motion until the desired dosage is achieved.  E. Tap the side of the syringe (1cc tuberculin syringe with a 29 gauge needle) to allow any air bubbles to float towards the needle. Avoid having these air bubbles in the syringe when self-injecting by first injecting out the collected bubbles that may form.  F. Remove the needle from the bottle  and replace the protective cap on the needle.    3. SELECT AND PREPARE THE SITE FOR INJECTION  A. The proper location for injection is at the 9-11 and 1-3 o'clock positions, between the base and mid-portion of the penis.(see diagram) Avoid the midline because of potential for injury to the urethra (6 o'clock; for urinary passage) and the penile arteries and nerves (near 12 o'clock). Avoid any visible veins or arteries on the surface.  B. Grasp and pull the head of the penis toward the side of your leg with the index finger and thumb (use the left hand, if right handed). While maintaining light tension, select a site for injection.  C. Clean the site with an alcohol pad.   4: INJECT TRIMIX AND APPLY COMPRESSION  A. With a steady and continuous motion, penetrate the skin with the needle at a 90 o angle. The needle should then be advanced to the hub. Slight resistance is encountered as the needle passes into the proper position within the erectile tissue (corporeal body).  B. Inject the Trimix over approximately 4 seconds. Withdraw the needle from the penis and apply compression to the injection site for approximately 1 minute. Several minutes of compression may be required to avoid bleeding, especially if you are an aspirin user.  C. Replace the cap on the needle and dispose of properly.   For additional  info please call: [phone] Or email: [email or web address]

## 2024-02-13 ENCOUNTER — Other Ambulatory Visit: Payer: Self-pay | Admitting: Internal Medicine

## 2024-03-11 ENCOUNTER — Ambulatory Visit (INDEPENDENT_AMBULATORY_CARE_PROVIDER_SITE_OTHER): Admitting: Internal Medicine

## 2024-03-11 ENCOUNTER — Encounter: Payer: Self-pay | Admitting: Internal Medicine

## 2024-03-11 VITALS — BP 130/80 | HR 84 | Temp 97.7°F | Ht 66.0 in | Wt 194.0 lb

## 2024-03-11 DIAGNOSIS — E1140 Type 2 diabetes mellitus with diabetic neuropathy, unspecified: Secondary | ICD-10-CM | POA: Diagnosis not present

## 2024-03-11 DIAGNOSIS — J0140 Acute pansinusitis, unspecified: Secondary | ICD-10-CM

## 2024-03-11 DIAGNOSIS — J301 Allergic rhinitis due to pollen: Secondary | ICD-10-CM | POA: Diagnosis not present

## 2024-03-11 DIAGNOSIS — Z7985 Long-term (current) use of injectable non-insulin antidiabetic drugs: Secondary | ICD-10-CM

## 2024-03-11 MED ORDER — DOXYCYCLINE HYCLATE 100 MG PO TABS
100.0000 mg | ORAL_TABLET | Freq: Two times a day (BID) | ORAL | 0 refills | Status: DC
Start: 2024-03-11 — End: 2024-06-03

## 2024-03-11 MED ORDER — TIRZEPATIDE 12.5 MG/0.5ML ~~LOC~~ SOAJ: 12.5000 mg | SUBCUTANEOUS | 3 refills | Status: DC

## 2024-03-11 NOTE — Assessment & Plan Note (Signed)
 Asked him to restart allegra for the next few weeks

## 2024-03-11 NOTE — Assessment & Plan Note (Signed)
 Has some allergy symptoms but may be infected also Will try doxy 100  bid x 7 days

## 2024-03-11 NOTE — Progress Notes (Signed)
 Subjective:    Patient ID: Randall Pace, male    DOB: 04-Apr-1956, 68 y.o.   MRN: [id number]  HPI Here due to respiratory symptoms  Has drainage on the right side--down into neck Causing cough Goes back 2 weeks No fever--doesn't really feel sick Some itching sensation in nose  Eyes watering some No sore throat No SOB Feels it in his upper right chest--thinks it is drainage  Not using fexofenadine--not sure it helped Thought montelukast  made him cough more Only using ipratropium   Abnormal hearing on right--feels it if he rubs his right chin  Stopped the metformin  Had some people tell him how bad it is Hasn't been checking sugars No problems with mounjaro   Current Outpatient Medications on File Prior to Visit  Medication Sig Dispense Refill   aspirin 81 MG tablet Take 81 mg by mouth daily.     atorvastatin  (LIPITOR) 40 MG tablet TAKE 1 TABLET BY MOUTH EVERY DAY 30 tablet 0   FARXIGA  5 MG TABS tablet TAKE 1 TABLET BY MOUTH DAILY BEFORE BREAKFAST. 90 tablet 3   fenofibrate  54 MG tablet TAKE 1 TABLET(54 MG) BY MOUTH DAILY 90 tablet 3   fexofenadine (ALLEGRA) 180 MG tablet Take 180 mg by mouth daily.     gabapentin  (NEURONTIN ) 300 MG capsule Take 300 mg by mouth 2 (two) times daily.     glipiZIDE  (GLUCOTROL ) 10 MG tablet TAKE 1 TABLET (10 MG TOTAL) BY MOUTH TWICE A DAY BEFORE A MEAL 180 tablet 3   ipratropium (ATROVENT) 0.06 % nasal spray PLACE 2 SPRAYS IN EACH NOSTRIL TWICE A DAY 45 mL 1   omeprazole  (PRILOSEC) 20 MG capsule TAKE 1 CAPSULE BY MOUTH EVERY DAY 90 capsule 3   tadalafil  (CIALIS ) 20 MG tablet Take 0.5-1 tablets (10-20 mg total) by mouth every other day as needed for erectile dysfunction. 10 tablet 11   tirzepatide  (MOUNJARO ) 10 MG/0.5ML Pen Inject 10 mg into the skin once a week. 6 mL 4   TURMERIC PO Take 1 tablet by mouth daily.     valsartan -hydrochlorothiazide  (DIOVAN -HCT) 320-25 MG tablet TAKE 1 TABLET BY MOUTH EVERY DAY 90 tablet 3   verapamil  (VERELAN ) 360  MG 24 hr capsule TAKE 1 CAPSULE (360 MG TOTAL) BY MOUTH AT BEDTIME. 90 capsule 3   No current facility-administered medications on file prior to visit.    No Known Allergies  Past Medical History:  Diagnosis Date   Arthritis    low back   Diabetes mellitus    GERD (gastroesophageal reflux disease)    History of chicken pox    Hypertension    Migraine    Patellofemoral pain syndrome    Sleep apnea     Past Surgical History:  Procedure Laterality Date   NASAL SINUS SURGERY     palatoplasts     TONSILLECTOMY     uvoloplasty      Family History  Problem Relation Age of Onset   Dementia Mother    Prostate cancer Father    Colon cancer Father    Leukemia Father    Diabetes Father    Esophageal cancer Neg Hx    Stomach cancer Neg Hx    Rectal cancer Neg Hx     Social History   Socioeconomic History   Marital status: Married    Spouse name: Not on file   Number of children: 2   Years of education: Not on file   Highest education level: Not on file  Occupational History  Occupation: Investment banker, corporate: [employer]  Tobacco Use   Smoking status: Never    Passive exposure: Past   Smokeless tobacco: Never  Vaping Use   Vaping status: Never Used  Substance and Sexual Activity   Alcohol use: Yes    Comment: occasional   Drug use: No   Sexual activity: Yes  Other Topics Concern   Not on file  Social History Narrative   No living will   Wife should be health care POA   Would accept resuscitation   Not sure about tube feeds   Social Drivers of Health   Financial Resource Strain: Not on file  Food Insecurity: Not on file  Transportation Needs: Not on file  Physical Activity: Not on file  Stress: Not on file  Social Connections: Not on file  Intimate Partner Violence: Not on file   Review of Systems Having trouble with eyes---trouble looking into the sun, and with headlights at night    Objective:   Physical Exam Constitutional:      Appearance: Normal  appearance.  HENT:     Head:     Comments: No sinus tenderness    Right Ear: Tympanic membrane and ear canal normal.     Left Ear: Tympanic membrane and ear canal normal.     Mouth/Throat:     Pharynx: No oropharyngeal exudate or posterior oropharyngeal erythema.  Pulmonary:     Effort: Pulmonary effort is normal.     Breath sounds: Normal breath sounds. No wheezing or rales.  Musculoskeletal:     Cervical back: Neck supple.  Lymphadenopathy:     Cervical: No cervical adenopathy.  Neurological:     Mental Status: He is alert.            Assessment & Plan:

## 2024-03-11 NOTE — Assessment & Plan Note (Signed)
 Stopped the metformin  Will increase the mounjaro  to 12.5mg  weekly Recheck in 3 months

## 2024-04-03 ENCOUNTER — Telehealth: Payer: Self-pay

## 2024-04-03 NOTE — Telephone Encounter (Signed)
 Copied from CRM [id number]. Topic: Clinical - Prescription Issue >> Apr 03, 2024  1:15 PM Felizardo Hotter wrote: Reason for CRM: Pt called regarding tadalafil  (CIALIS ) 20 MG tablet. Pt states it is not strong enough and is doing nothing for him. Pt would like medication that is more effective. Please call pt at [phone]. [id number] [place], [place] - [address]

## 2024-04-03 NOTE — Telephone Encounter (Signed)
 Called and spoke to pt. Advised that is the highest dose of that medication he is on and most of the other medications are dosed the same way. He has been on sildenafil  before. He did say he has taken 2 and 3 of the Cialis  to be able to make it work. I advised that is not safe. I offered to make him an appt with Dr Joelle Musca to discuss other options. He asked what else Dr Joelle Musca could do. I stated a possible referral to a urologist. Also, make sure he is keeping his blood sugar in check. He said he has seen a urologist a few years ago. He will call and make an appt with them.

## 2024-04-08 NOTE — Progress Notes (Unsigned)
 04/09/2024 4:18 PM   Randall Pace 01-Apr-1956 [id number]  Referring provider: Helaine Llanos, MD [address] [place],  [place] [address]  Urological history: 1. Elevated PSA - PSA (12/2023) 2.99 - has follow up with me for repeat labs in August  2. ED - failed PDE5i's  3. BPH with LU TS   No chief complaint on file.  HPI: Randall Pace is a 68 y.o. man who presents today for ED.  Previous records reviewed.   SHIM ***    Score: 1-7 Severe ED 8-11 Moderate ED 12-16 Mild-Moderate ED 17-21 Mild ED 22-25 No ED   PMH: Past Medical History:  Diagnosis Date   Arthritis    low back   Diabetes mellitus    GERD (gastroesophageal reflux disease)    History of chicken pox    Hypertension    Migraine    Patellofemoral pain syndrome    Sleep apnea     Surgical History: Past Surgical History:  Procedure Laterality Date   NASAL SINUS SURGERY     palatoplasts     TONSILLECTOMY     uvoloplasty      Home Medications:  Allergies as of 04/09/2024   No Known Allergies      Medication List        Accurate as of April 08, 2024  4:18 PM. If you have any questions, ask your nurse or doctor.          aspirin 81 MG tablet Take 81 mg by mouth daily.   atorvastatin  40 MG tablet Commonly known as: LIPITOR TAKE 1 TABLET BY MOUTH EVERY DAY   doxycycline  100 MG tablet Commonly known as: VIBRA -TABS Take 1 tablet (100 mg total) by mouth 2 (two) times daily.   Farxiga  5 MG Tabs tablet Generic drug: dapagliflozin  propanediol TAKE 1 TABLET BY MOUTH DAILY BEFORE BREAKFAST.   fenofibrate  54 MG tablet TAKE 1 TABLET(54 MG) BY MOUTH DAILY   fexofenadine 180 MG tablet Commonly known as: ALLEGRA Take 180 mg by mouth daily.   gabapentin  300 MG capsule Commonly known as: NEURONTIN  Take 300 mg by mouth 2 (two) times daily.   glipiZIDE  10 MG tablet Commonly known as: GLUCOTROL  TAKE 1 TABLET (10 MG TOTAL) BY MOUTH TWICE A DAY BEFORE A MEAL    ipratropium 0.06 % nasal spray Commonly known as: ATROVENT PLACE 2 SPRAYS IN EACH NOSTRIL TWICE A DAY   omeprazole  20 MG capsule Commonly known as: PRILOSEC TAKE 1 CAPSULE BY MOUTH EVERY DAY   tadalafil  20 MG tablet Commonly known as: CIALIS  Take 0.5-1 tablets (10-20 mg total) by mouth every other day as needed for erectile dysfunction.   tirzepatide  12.5 MG/0.5ML Pen Commonly known as: MOUNJARO  Inject 12.5 mg into the skin once a week.   TURMERIC PO Take 1 tablet by mouth daily.   valsartan -hydrochlorothiazide  320-25 MG tablet Commonly known as: DIOVAN -HCT TAKE 1 TABLET BY MOUTH EVERY DAY   verapamil  360 MG 24 hr capsule Commonly known as: VERELAN  TAKE 1 CAPSULE (360 MG TOTAL) BY MOUTH AT BEDTIME.        Allergies: No Known Allergies  Family History: Family History  Problem Relation Age of Onset   Dementia Mother    Prostate cancer Father    Colon cancer Father    Leukemia Father    Diabetes Father    Esophageal cancer Neg Hx    Stomach cancer Neg Hx    Rectal cancer Neg Hx     Social  History:  reports that he has never smoked. He has been exposed to tobacco smoke. He has never used smokeless tobacco. He reports current alcohol use. He reports that he does not use drugs.  ROS: Pertinent ROS in HPI  Physical Exam: There were no vitals taken for this visit.  Constitutional:  Well nourished. Alert and oriented, No acute distress. HEENT: [place] AT, moist mucus membranes.  Trachea midline, no masses. Cardiovascular: No clubbing, cyanosis, or edema. Respiratory: Normal respiratory effort, no increased work of breathing. GI: Abdomen is soft, non tender, non distended, no abdominal masses. Liver and spleen not palpable.  No hernias appreciated.  Stool sample for occult testing is not indicated.   GU: No CVA tenderness.  No bladder fullness or masses.  Patient with circumcised/uncircumcised phallus. ***Foreskin easily retracted***  Urethral meatus is patent.  No penile  discharge. No penile lesions or rashes. Scrotum without lesions, cysts, rashes and/or edema.  Testicles are located scrotally bilaterally. No masses are appreciated in the testicles. Left and right epididymis are normal. Rectal: Patient with  normal sphincter tone. Anus and perineum without scarring or rashes. No rectal masses are appreciated. Prostate is approximately *** grams, *** nodules are appreciated. Seminal vesicles are normal. Skin: No rashes, bruises or suspicious lesions. Lymph: No cervical or inguinal adenopathy. Neurologic: Grossly intact, no focal deficits, moving all 4 extremities. Psychiatric: Normal mood and affect.  Laboratory Data: Lab Results  Component Value Date   WBC 5.6 12/03/2023   HGB 13.1 12/03/2023   HCT 39.8 12/03/2023   MCV 90.3 12/03/2023   PLT 279.0 12/03/2023   Lab Results  Component Value Date   CREATININE 1.32 12/03/2023   Lab Results  Component Value Date   PSA 2.99 12/03/2023   PSA 1.77 11/30/2022   PSA 1.39 03/23/2021   Lab Results  Component Value Date   HGBA1C 6.7 (H) 12/03/2023       Component Value Date/Time   CHOL 166 12/03/2023 1016   HDL 42.50 12/03/2023 1016   CHOLHDL 4 12/03/2023 1016   VLDL 21.2 12/03/2023 1016   LDLCALC 103 (H) 12/03/2023 1016    Lab Results  Component Value Date   AST 13 12/03/2023   Lab Results  Component Value Date   ALT 12 12/03/2023  I have reviewed the labs.   Pertinent Imaging: N/A  Assessment & Plan:  ***  1. Erectile dysfunction - I explained to the patient that in order to achieve an erection it takes good functioning of the nervous system (parasympathetic and rs, sympathetic, sensory and motor), good blood flow into the erectile tissue of the penis and a desire to have sex - I explained that conditions like diabetes, hypertension, coronary artery disease, peripheral vascular disease, smoking, alcohol consumption, age, sleep apnea and BPH can diminish the ability to have an  erection - I explained the ED may be a risk marker for underlying CVD and he should follow up with PCP for further studies *** - we will obtain a serum testosterone level at this time; if it is abnormal we will need to repeat the study for confirmation *** - A recent study published in Sex Med 2018 Apr 13 revealed moderate to vigorous aerobic exercise for 40 minutes 4 times per week can decrease erectile problems caused by physical inactivity, obesity, hypertension, metabolic syndrome and/or cardiovascular diseases *** - We discussed trying a *** different PDE5 inhibitor, intra-urethral suppositories, intracavernous vasoactive drug injection therapy, vacuum erection devices, LI-ESWT and penile prosthesis implantation  No follow-ups on file.  These notes generated with voice recognition software. I apologize for typographical errors.  Briant Camper  [facility] [address]  [address] [place], [place] [address] [phone]

## 2024-04-09 ENCOUNTER — Ambulatory Visit (INDEPENDENT_AMBULATORY_CARE_PROVIDER_SITE_OTHER): Admitting: Urology

## 2024-04-09 VITALS — BP 153/74 | HR 99

## 2024-04-09 DIAGNOSIS — N529 Male erectile dysfunction, unspecified: Secondary | ICD-10-CM | POA: Diagnosis not present

## 2024-04-10 ENCOUNTER — Encounter: Payer: Self-pay | Admitting: Urology

## 2024-04-10 ENCOUNTER — Ambulatory Visit: Payer: Self-pay | Admitting: Urology

## 2024-04-10 LAB — TESTOSTERONE: Testosterone: 218 ng/dL — ABNORMAL LOW (ref 264–916)

## 2024-04-22 ENCOUNTER — Other Ambulatory Visit: Payer: Self-pay | Admitting: Urology

## 2024-04-22 MED ORDER — AMBULATORY NON FORMULARY MEDICATION: 0 refills | Status: DC

## 2024-05-07 NOTE — Progress Notes (Unsigned)
 05/07/2024 9:21 AM  Randall Pace 07-Mar-1956 [id number]   Referring provider: Jimmy Charlie FERNS, MD [address] [place],  [place] [address]  Urological history: 1. Elevated PSA - PSA (12/2023) 2.99 - has follow up with me for repeat labs in August   2. ED - failed PDE5i's - testosterone level pending    3. BPH with LU TS   No chief complaint on file.  HPI: Randall Pace is a 69 y.o. male who presents today for ICI titration with Trimix.    Previous records reviewed.     He has been experiencing issues with ED for several years.   He is having difficulty with achieving and maintaining erections.  He is no longer having nocturnal tumescence or having morning erections.   He reports persistent ED despite the use of PDE5i's.  No history of priapism, Peyronie's disease or penile trauma.  ***   Physical Exam:  There were no vitals taken for this visit.  Constitutional:  Well nourished. Alert and oriented, No acute distress. GU: No CVA tenderness.  No bladder fullness or masses.  Patient with circumcised/uncircumcised phallus. ***Foreskin easily retracted***  Urethral meatus is patent.  No penile discharge. No penile lesions or rashes.  Psychiatric: Normal mood and affect.   Procedure *** Patient's left corpus cavernosum is identified.  An area near the base of the penis is cleansed with rubbing alcohol.  Careful to avoid the dorsal vein, 2 mcg of Trimix (papaverine 30 mg, phentolamine 1 mg and prostaglandin E1 10 mcg, Lot # ***@*** exp # *** is injected at a 90 degree angle into the left *** corpus cavernosum near the base of the penis.  Patient experienced a very firm erection in 15 minutes.    Patient's right corpus cavernosum is identified.  An area near the base of the penis is cleansed with rubbing alcohol.  Careful to avoid the dorsal vein,  2 mcg of Trimix (papaverine 30 mg, phentolamine 1 mg and prostaglandin E1 10 mcg, Lot # ***@*** exp # *** is injected at a  90 degree angle into the right corpus cavernosum near the base of the penis.  Patient experienced a very firm erection in 15 minutes.    Patient's left corpus cavernosum is identified.  An area near the base of the penis is cleansed with rubbing alcohol.  Careful to avoid the dorsal vein, 2 mcg of Trimix (papaverine 30 mg, phentolamine 1 mg and prostaglandin E1 10 mcg, Lot # ***@*** exp # *** is injected at a 90 degree angle into the left *** corpus cavernosum near the base of the penis.  Patient experienced a very firm erection in 15 minutes.    Patient's right corpus cavernosum is identified.  An area near the base of the penis is cleansed with rubbing alcohol.  Careful to avoid the dorsal vein, 2 mcg of Trimix (papaverine 30 mg, phentolamine 1 mg and prostaglandin E1 10 mcg, Lot # ***@*** exp # *** is injected at a 90 degree angle into the right corpus cavernosum near the base of the penis.  Patient experienced a very firm erection in 15 minutes.     Assessment & Plan:    1.  Erectile dysfunction - taught proper injection technique, including sterile handling, correct anatomical location and dosing - advised to use no more than once in 48-72 hours; instructed to seek care if erection persists beyond 4 hours    No follow-ups on file.  Clotilda Cornwall, PA-C  [facility] [address] [place], [place] [address] [phone]  Time Spent: Total time spent on the day of the encounter to include pre-visit record review, face-to-face time with the patient, and post-visit ordering of tests.  *** minutes.

## 2024-05-08 ENCOUNTER — Ambulatory Visit (INDEPENDENT_AMBULATORY_CARE_PROVIDER_SITE_OTHER): Admitting: Urology

## 2024-05-08 ENCOUNTER — Encounter: Payer: Self-pay | Admitting: Urology

## 2024-05-08 VITALS — BP 131/82 | HR 84 | Ht 66.0 in | Wt 194.0 lb

## 2024-05-08 DIAGNOSIS — N529 Male erectile dysfunction, unspecified: Secondary | ICD-10-CM

## 2024-05-08 NOTE — Patient Instructions (Addendum)
 TRIMIX SELF-INJECTION INSTRUCTIONS    DETAILED PROCEDURE  1. GETTING SET UP  A. Proper hygiene is important. Wash your hands and keep the penis clean.  B. Assemble the following:  - Bottle of Trimix  - Alcohol pad  - Syringe  C. Keep the Trimix cold by returning the bottle to the refrigerator, or by placing the bottle in a cup of ice.   2. PREPARE THE SYRINGE  A. Wipe the rubber top of the vial with an alcohol pad.  B. After removing the cap of the needle, pull the plunger back to the desired dosage, filling this volume with air. Use a new needle and syringe each time.  C. Insert the needle through the rubber top and inject the air into the vial.  D. Turn the vial with needle and syringe inserted upside down. Pull back on the syringe plunger in a slow and steady motion until the desired dosage is achieved.  Yours will be 0.3cc E. Tap the side of the syringe (1cc tuberculin syringe with a 29 gauge needle) to allow any air bubbles to float towards the needle. Avoid having these air bubbles in the syringe when self-injecting by first injecting out the collected bubbles that may form.  F. Remove the needle from the bottle and replace the protective cap on the needle.    3. SELECT AND PREPARE THE SITE FOR INJECTION  A. The proper location for injection is at the 9-11 and 1-3 o'clock positions, between the base and mid-portion of the penis.(see diagram) Avoid the midline because of potential for injury to the urethra (6 o'clock; for urinary passage) and the penile arteries and nerves (near 12 o'clock). Avoid any visible veins or arteries on the surface.  B. Grasp and pull the head of the penis toward the side of your leg with the index finger and thumb (use the left hand, if right handed). While maintaining light tension, select a site for injection.  C. Clean the site with an alcohol pad.   4: INJECT TRIMIX AND APPLY COMPRESSION  A. With a steady and continuous motion, penetrate the skin with  the needle at a 90 o angle. The needle should then be advanced to the hub. Slight resistance is encountered as the needle passes into the proper position within the erectile tissue (corporeal body).  B. Inject the Trimix over approximately 4 seconds. Withdraw the needle from the penis and apply compression to the injection site for approximately 1 minute. Several minutes of compression may be required to avoid bleeding, especially if you are an aspirin user.  C. Replace the cap on the needle and dispose of properly.   If you experience a painful erection that will not go down, take four (30 mg) tablets of pseudoephedrine (Sudafed-not the extended release) and if the erection does not go down in the next hour or increases in pain, contact the office immediately or seek treatment in the ED

## 2024-05-09 ENCOUNTER — Ambulatory Visit: Payer: Self-pay | Admitting: Urology

## 2024-05-09 DIAGNOSIS — N529 Male erectile dysfunction, unspecified: Secondary | ICD-10-CM

## 2024-05-09 LAB — TESTOSTERONE: Testosterone: 191 ng/dL — ABNORMAL LOW (ref 264–916)

## 2024-05-23 ENCOUNTER — Other Ambulatory Visit: Payer: Self-pay | Admitting: Internal Medicine

## 2024-06-03 ENCOUNTER — Encounter: Payer: Self-pay | Admitting: Internal Medicine

## 2024-06-03 ENCOUNTER — Ambulatory Visit (INDEPENDENT_AMBULATORY_CARE_PROVIDER_SITE_OTHER): Admitting: Internal Medicine

## 2024-06-03 VITALS — BP 126/70 | HR 96 | Temp 98.5°F | Ht 66.0 in | Wt 195.0 lb

## 2024-06-03 DIAGNOSIS — R051 Acute cough: Secondary | ICD-10-CM | POA: Diagnosis not present

## 2024-06-03 DIAGNOSIS — U071 COVID-19: Secondary | ICD-10-CM | POA: Insufficient documentation

## 2024-06-03 LAB — POC COVID19 BINAXNOW: SARS Coronavirus 2 Ag: POSITIVE — AB

## 2024-06-03 MED ORDER — BENZONATATE 200 MG PO CAPS: 200.0000 mg | ORAL_CAPSULE | Freq: Three times a day (TID) | ORAL | 0 refills | Status: DC | PRN

## 2024-06-03 MED ORDER — NIRMATRELVIR/RITONAVIR (PAXLOVID) TABLET (RENAL DOSING): 2.0000 | ORAL_TABLET | Freq: Two times a day (BID) | ORAL | 0 refills | Status: AC

## 2024-06-03 NOTE — Assessment & Plan Note (Signed)
 Sick for 3 days Looks okay--but mildly ill Discussed paxlovid --will try Supportive care--analgesics, benzonatate 

## 2024-06-03 NOTE — Progress Notes (Signed)
 Subjective:    Patient ID: Randall Pace, male    DOB: [birth year], 68 y.o.   MRN: [id number]  HPI Here due to respiratory illness  Recent cruise--started with some runny nose, etc Then really got sick 8/2 and had to stay in room Felt tired, no energy Got home 8/3---then cough, muscle aches, rhinorrhea Felt like fever Cough is dry--but had some mucus (just swallows) Feels some SOB---with activity or prolonged talking Has sore throat No headache or ear pain (but hearing off on left) Has swelling/pain in right jaw/cheek (may have bit cheek)  Using acetaminophen, alka seltzer cold/plus, nyquil  Current Outpatient Medications on File Prior to Visit  Medication Sig Dispense Refill   AMBULATORY NON FORMULARY MEDICATION Trimix (30/1/10)-(Pap/Phent/PGE)  Test Dose  1ml vial   Qty #3 Refills 0  [facility] [phone] Fax [phone] 3 vial 0   aspirin 81 MG tablet Take 81 mg by mouth daily.     atorvastatin  (LIPITOR) 40 MG tablet TAKE 1 TABLET BY MOUTH EVERY DAY 30 tablet 0   dapagliflozin  propanediol (FARXIGA ) 5 MG TABS tablet TAKE 1 TABLET BY MOUTH EVERY DAY BEFORE BREAKFAST 90 tablet 1   fenofibrate  54 MG tablet TAKE 1 TABLET(54 MG) BY MOUTH DAILY 90 tablet 3   fexofenadine (ALLEGRA) 180 MG tablet Take 180 mg by mouth daily.     gabapentin  (NEURONTIN ) 300 MG capsule Take 300 mg by mouth 2 (two) times daily.     glipiZIDE  (GLUCOTROL ) 10 MG tablet TAKE 1 TABLET (10 MG TOTAL) BY MOUTH TWICE A DAY BEFORE A MEAL 180 tablet 3   ipratropium (ATROVENT) 0.06 % nasal spray PLACE 2 SPRAYS IN EACH NOSTRIL TWICE A DAY 45 mL 1   omeprazole  (PRILOSEC) 20 MG capsule TAKE 1 CAPSULE BY MOUTH EVERY DAY 90 capsule 3   tadalafil  (CIALIS ) 20 MG tablet Take 0.5-1 tablets (10-20 mg total) by mouth every other day as needed for erectile dysfunction. 10 tablet 11   tirzepatide  (MOUNJARO ) 12.5 MG/0.5ML Pen Inject 12.5 mg into the skin once a week. 6 mL 3   valsartan -hydrochlorothiazide   (DIOVAN -HCT) 320-25 MG tablet TAKE 1 TABLET BY MOUTH EVERY DAY 90 tablet 3   verapamil  (VERELAN ) 360 MG 24 hr capsule TAKE 1 CAPSULE (360 MG TOTAL) BY MOUTH AT BEDTIME. 90 capsule 3   No current facility-administered medications on file prior to visit.    No Known Allergies  Past Medical History:  Diagnosis Date   Arthritis    low back   Diabetes mellitus    GERD (gastroesophageal reflux disease)    History of chicken pox    Hypertension    Migraine    Patellofemoral pain syndrome    Sleep apnea     Past Surgical History:  Procedure Laterality Date   NASAL SINUS SURGERY     palatoplasts     TONSILLECTOMY     uvoloplasty      Family History  Problem Relation Age of Onset   Dementia Mother    Prostate cancer Father    Colon cancer Father    Leukemia Father    Diabetes Father    Esophageal cancer Neg Hx    Stomach cancer Neg Hx    Rectal cancer Neg Hx     Social History   Socioeconomic History   Marital status: Married    Spouse name: Not on file   Number of children: 2   Years of education: Not on file   Highest education level: Not on  file  Occupational History   Occupation: Investment banker, corporate: [employer]  Tobacco Use   Smoking status: Never    Passive exposure: Past   Smokeless tobacco: Never  Vaping Use   Vaping status: Never Used  Substance and Sexual Activity   Alcohol use: Yes    Comment: occasional   Drug use: No   Sexual activity: Yes  Other Topics Concern   Not on file  Social History Narrative   No living will   Wife should be health care POA   Would accept resuscitation   Not sure about tube feeds   Social Drivers of Health   Financial Resource Strain: Not on file  Food Insecurity: Not on file  Transportation Needs: Not on file  Physical Activity: Not on file  Stress: Not on file  Social Connections: Not on file  Intimate Partner Violence: Not on file   Review of Systems No loss of smell or taste No N/V Able to eat      Objective:   Physical Exam Constitutional:      Appearance: Normal appearance.  HENT:     Head:     Comments: No sinus tenderness    Right Ear: Tympanic membrane and ear canal normal.     Left Ear: Tympanic membrane and ear canal normal.     Mouth/Throat:     Pharynx: No oropharyngeal exudate or posterior oropharyngeal erythema.  Neck:     Comments: Non tender anterior cervical nodes Pulmonary:     Effort: Pulmonary effort is normal.     Breath sounds: No wheezing or rales.     Comments: Coarse cough (with some reported rib pain) Musculoskeletal:     Cervical back: Neck supple.  Neurological:     Mental Status: He is alert.            Assessment & Plan:

## 2024-06-04 ENCOUNTER — Telehealth: Payer: Self-pay

## 2024-06-04 NOTE — Telephone Encounter (Signed)
 Copied from CRM #[id number]. Topic: General - Other >> Jun 04, 2024 12:17 PM Pinkey ORN wrote: Reason for CRM: MD Note >> Jun 04, 2024 12:19 PM Pinkey ORN wrote: Patient called in states he misplaced the MD Note given by Jimmy Charlie FERNS, MD . Patient wants to know if it can be sent over to [email or web address] as today is the last day to have it submitted. Any further questions, patient call back number is [phone]

## 2024-06-04 NOTE — Telephone Encounter (Signed)
 Spoke to pt. Advised him I emailed it from our secure machine up front.

## 2024-06-08 ENCOUNTER — Encounter: Payer: Self-pay | Admitting: Internal Medicine

## 2024-06-10 ENCOUNTER — Other Ambulatory Visit

## 2024-06-10 DIAGNOSIS — R972 Elevated prostate specific antigen [PSA]: Secondary | ICD-10-CM

## 2024-06-10 DIAGNOSIS — N529 Male erectile dysfunction, unspecified: Secondary | ICD-10-CM

## 2024-06-11 ENCOUNTER — Encounter: Payer: Self-pay | Admitting: Internal Medicine

## 2024-06-11 ENCOUNTER — Ambulatory Visit: Payer: Self-pay | Admitting: Internal Medicine

## 2024-06-11 ENCOUNTER — Ambulatory Visit: Admitting: Internal Medicine

## 2024-06-11 VITALS — BP 122/78 | HR 82 | Temp 99.0°F | Ht 66.0 in | Wt 195.0 lb

## 2024-06-11 DIAGNOSIS — U071 COVID-19: Secondary | ICD-10-CM | POA: Diagnosis not present

## 2024-06-11 DIAGNOSIS — N1831 Chronic kidney disease, stage 3a: Secondary | ICD-10-CM | POA: Diagnosis not present

## 2024-06-11 DIAGNOSIS — E1140 Type 2 diabetes mellitus with diabetic neuropathy, unspecified: Secondary | ICD-10-CM

## 2024-06-11 DIAGNOSIS — Z7984 Long term (current) use of oral hypoglycemic drugs: Secondary | ICD-10-CM

## 2024-06-11 DIAGNOSIS — Z7985 Long-term (current) use of injectable non-insulin antidiabetic drugs: Secondary | ICD-10-CM

## 2024-06-11 DIAGNOSIS — I10 Essential (primary) hypertension: Secondary | ICD-10-CM | POA: Diagnosis not present

## 2024-06-11 LAB — TESTOSTERONE: Testosterone: 237 ng/dL — ABNORMAL LOW (ref 264–916)

## 2024-06-11 LAB — POCT GLYCOSYLATED HEMOGLOBIN (HGB A1C): Hemoglobin A1C: 6.8 % — AB (ref 4.0–5.6)

## 2024-06-11 LAB — PSA: Prostate Specific Ag, Serum: 3 ng/mL (ref 0.0–4.0)

## 2024-06-11 NOTE — Assessment & Plan Note (Signed)
 BP Readings from Last 3 Encounters:  06/11/24 122/78  06/03/24 126/70  05/08/24 131/82   Controlled on the valsartan /hydrochlorothiazide  320/25 and verapamil  360 daily

## 2024-06-11 NOTE — Assessment & Plan Note (Signed)
 Improved but still with productive cough If not mostly resolved by next week---would send z-pak

## 2024-06-11 NOTE — Assessment & Plan Note (Signed)
 A1c stable at 6.8% Farxiga  5, glipizide  10 bid, mounjaro  12.5 weekly Gabapentin  for the neuropathy

## 2024-06-11 NOTE — Progress Notes (Signed)
 Subjective:    Patient ID: Randall Pace, male    DOB: Sep 02, 1956, 68 y.o.   MRN: [id number]  HPI Here for follow up of diabetes and other chronic health conditions  Is feeling some better Got the paxlovid  but didn't take it Had very productive cough---just continued the cough meds Feels like things are improved--still bringing up some sputum No fever No SOB  Not really checking sugars Appetite still down some with the mounjaro ---but still can eat a lot at times (like binging on twizzlers) Ongoing numbness in feet--some pain at times Gabapentin  still twice a day  No chest pain No palpitations Some dizziness at times---more with infection. May be related to recent cruise No syncope  Last GFR 56  Current Outpatient Medications on File Prior to Visit  Medication Sig Dispense Refill   AMBULATORY NON FORMULARY MEDICATION Trimix (30/1/10)-(Pap/Phent/PGE)  Test Dose  1ml vial   Qty #3 Refills 0  [facility] [phone] Fax [phone] 3 vial 0   aspirin 81 MG tablet Take 81 mg by mouth daily.     atorvastatin  (LIPITOR) 40 MG tablet TAKE 1 TABLET BY MOUTH EVERY DAY 30 tablet 0   benzonatate  (TESSALON ) 200 MG capsule Take 1 capsule (200 mg total) by mouth 3 (three) times daily as needed for cough. 60 capsule 0   dapagliflozin  propanediol (FARXIGA ) 5 MG TABS tablet TAKE 1 TABLET BY MOUTH EVERY DAY BEFORE BREAKFAST 90 tablet 1   fenofibrate  54 MG tablet TAKE 1 TABLET(54 MG) BY MOUTH DAILY 90 tablet 3   fexofenadine (ALLEGRA) 180 MG tablet Take 180 mg by mouth daily.     gabapentin  (NEURONTIN ) 300 MG capsule Take 300 mg by mouth 2 (two) times daily.     glipiZIDE  (GLUCOTROL ) 10 MG tablet TAKE 1 TABLET (10 MG TOTAL) BY MOUTH TWICE A DAY BEFORE A MEAL 180 tablet 3   ipratropium (ATROVENT) 0.06 % nasal spray PLACE 2 SPRAYS IN EACH NOSTRIL TWICE A DAY 45 mL 1   omeprazole  (PRILOSEC) 20 MG capsule TAKE 1 CAPSULE BY MOUTH EVERY DAY 90 capsule 3   tirzepatide  (MOUNJARO )  12.5 MG/0.5ML Pen Inject 12.5 mg into the skin once a week. 6 mL 3   valsartan -hydrochlorothiazide  (DIOVAN -HCT) 320-25 MG tablet TAKE 1 TABLET BY MOUTH EVERY DAY 90 tablet 3   verapamil  (VERELAN ) 360 MG 24 hr capsule TAKE 1 CAPSULE (360 MG TOTAL) BY MOUTH AT BEDTIME. 90 capsule 3   No current facility-administered medications on file prior to visit.    No Known Allergies  Past Medical History:  Diagnosis Date   Arthritis    low back   Diabetes mellitus    GERD (gastroesophageal reflux disease)    History of chicken pox    Hypertension    Migraine    Patellofemoral pain syndrome    Sleep apnea     Past Surgical History:  Procedure Laterality Date   NASAL SINUS SURGERY     palatoplasts     TONSILLECTOMY     uvoloplasty      Family History  Problem Relation Age of Onset   Dementia Mother    Prostate cancer Father    Colon cancer Father    Leukemia Father    Diabetes Father    Esophageal cancer Neg Hx    Stomach cancer Neg Hx    Rectal cancer Neg Hx     Social History   Socioeconomic History   Marital status: Married    Spouse name: Not on file  Number of children: 2   Years of education: Not on file   Highest education level: Some college, no degree  Occupational History   Occupation: Investment banker, corporate: [employer]  Tobacco Use   Smoking status: Never    Passive exposure: Past   Smokeless tobacco: Never  Vaping Use   Vaping status: Never Used  Substance and Sexual Activity   Alcohol use: Yes    Comment: occasional   Drug use: No   Sexual activity: Yes  Other Topics Concern   Not on file  Social History Narrative   No living will   Wife should be health care POA   Would accept resuscitation   Not sure about tube feeds   Social Drivers of Health   Financial Resource Strain: Patient Declined (06/09/2024)   Overall Financial Resource Strain (CARDIA)    Difficulty of Paying Living Expenses: Patient declined  Food Insecurity: Patient Declined  (06/09/2024)   Hunger Vital Sign    Worried About Running Out of Food in the Last Year: Patient declined    Ran Out of Food in the Last Year: Patient declined  Transportation Needs: No Transportation Needs (06/09/2024)   PRAPARE - Administrator, Civil Service (Medical): No    Lack of Transportation (Non-Medical): No  Physical Activity: Inactive (06/09/2024)   Exercise Vital Sign    Days of Exercise per Week: 0 days    Minutes of Exercise per Session: Not on file  Stress: Stress Concern Present (06/09/2024)   [employer] of Occupational Health - Occupational Stress Questionnaire    Feeling of Stress: To some extent  Social Connections: Socially Integrated (06/09/2024)   Social Connection and Isolation Panel    Frequency of Communication with Friends and Family: Three times a week    Frequency of Social Gatherings with Friends and Family: Never    Attends Religious Services: More than 4 times per year    Active Member of [employer] or Organizations: Yes    Attends Engineer, structural: More than 4 times per year    Marital Status: Married  Catering manager Violence: Not on file   Review of Systems Ongoing sleep issues---usually only sleeps well when exhausted Bowels still slow--was better on the cruise    Objective:   Physical Exam Constitutional:      Appearance: Normal appearance.  Cardiovascular:     Rate and Rhythm: Normal rate and regular rhythm.     Pulses: Normal pulses.     Heart sounds:     No gallop.     Comments: Gr 3/6 systolic murmur Pulmonary:     Effort: Pulmonary effort is normal.     Breath sounds: Normal breath sounds. No wheezing or rales.  Musculoskeletal:     Cervical back: Neck supple.     Right lower leg: No edema.     Left lower leg: No edema.  Lymphadenopathy:     Cervical: No cervical adenopathy.  Neurological:     Mental Status: He is alert.            Assessment & Plan:

## 2024-06-11 NOTE — Addendum Note (Signed)
 Addended by: KALLIE CLOTILDA SQUIBB on: 06/11/2024 09:16 AM   Modules accepted: Orders

## 2024-06-11 NOTE — Assessment & Plan Note (Signed)
 Mild On the valsartan 

## 2024-06-16 NOTE — Progress Notes (Deleted)
 06/16/2024 4:49 PM  Randall Pace 1956/08/27 [id number]   Referring provider: Jimmy Charlie FERNS, MD [address] [place],  [place] [address]  Urological history: 1. Elevated PSA - PSA (05/2024) 3.0   2. ED - failed PDE5i's - testosterone  level (05/2024) 237 - testosterone  level (04/2024) 191   3. BPH with LU TS   No chief complaint on file.  HPI: Randall Pace is a 68 y.o. male who presents today for ICI titration with Trimix.    Previous records reviewed.     He has been experiencing issues with ED for several years.   He is having difficulty with achieving and maintaining erections.  He is no longer having nocturnal tumescence or having morning erections.   He reports persistent ED despite the use of PDE5i's.  No history of priapism, Peyronie's disease or penile trauma.    Hbg A1c (05/2024) 6.8   Physical Exam:  There were no vitals taken for this visit.  Constitutional:  Well nourished. Alert and oriented, No acute distress. HEENT: [place] AT, moist mucus membranes.  Trachea midline, no masses. Cardiovascular: No clubbing, cyanosis, or edema. Respiratory: Normal respiratory effort, no increased work of breathing. GI: Abdomen is soft, non tender, non distended, no abdominal masses. Liver and spleen not palpable.  No hernias appreciated.  Stool sample for occult testing is not indicated.   GU: No CVA tenderness.  No bladder fullness or masses.  Patient with circumcised/uncircumcised phallus. ***Foreskin easily retracted***  Urethral meatus is patent.  No penile discharge. No penile lesions or rashes. Scrotum without lesions, cysts, rashes and/or edema.  Testicles are located scrotally bilaterally. No masses are appreciated in the testicles. Left and right epididymis are normal. Rectal: Patient with  normal sphincter tone. Anus and perineum without scarring or rashes. No rectal masses are appreciated. Prostate is approximately *** grams, *** nodules are appreciated.  Seminal vesicles are normal. Skin: No rashes, bruises or suspicious lesions. Lymph: No cervical or inguinal adenopathy. Neurologic: Grossly intact, no focal deficits, moving all 4 extremities. Psychiatric: Normal mood and affect.    Assessment & Plan:    1.  Erectile dysfunction - He did the second injection into the right corpora today, so he feels comfortable with doing the injections at home - taught proper injection technique, including sterile handling, correct anatomical location and dosing - advised to use no more than once in 48-72 hours; instructed to seek care if erection persists beyond 4 hours  - He we will start with 0.3 cc of Trimix prior to intercourse and will notify me of the results - Serum testosterone  is pending   2. Hypogonadism - explained that the diagnosis of testosterone  deficiency/hypogonadism requires two morning testosterones at least two days apart below 300 to meet criteria** - explained some insurances also require free and total testosterone , SHBG, FH/LH and prolactin levels as well  - explained some insurances will even require weight loss, managing diabetes, high blood pressure, sleep apnea and liver disease prior to covering testosterone  therapy - explained that TRT is not a treatment for ED, he may see some improvement in his erections, but his ED will likely persist even with therapeutic levels of testosterone  - discussed potential side effects of testosterone  replacement  including stimulation of erythrocytosis; edema; gynecomastia; worsening sleep apnea; venous thromboembolism; testicular atrophy and infertility.   The theoretical risk of growth stimulation of an undetected prostate cancer was also discussed.  He was informed that current evidence does not provide any definitive answers  regarding the risks of testosterone  therapy on prostate cancer and cardiovascular disease. The need for periodic monitoring of his testosterone  level, PSA, hematocrit and  DRE was discussed.  This monitoring will be conducted every three months during the first year of TRT and then every 6 months if blood work remains stable, if there is an abnormality found in follow up blood work, it will result in the monitoring of blood work more frequently  - advised that any missed or delayed appointments will also result in delays in the refilling of the TRT as it is a controlled substance - advised that the office requires one week to refill TRT - Near castrate testosterone  levels*** - Significant symptoms***feeling depressed or tired, having little to no interest in sex, low energy and weak muscles or bones - Recommend starting TRT*** - We discussed the most common forms of replacement including intramuscular injection and gels and he desires to start injections*** - Rx testosterone  cypionate-200 mg every 2 weeks to start*** - Appointment will be made for injection training*** - Follow-up 5 weeks after starting TRT for testosterone  level and symptom check*** - discussed that we follow guidelines for age appropriate testosterone  levels to decide on dosing regimens for TRT and this is based on current agreements in the medical community and we will not deviate from this unless there is good scientific data to do so [employer] may not recognize the parameters we use to determine that the patient has low testosterone  and therefore, if they want to pursue TRT, it will have be out-of-pocket   - we also discussed his history of an increase in his PSA and whether or not it indicates a possible prostate cancer, his options would be to forego TRT, get a prostate MRI or start TRT and have further discussion if his PSA starts to rise while on TRT   20-39 years ([phone]) 40-49 (252-916) (5.3-26.3) 50-59 (215-878) (4.2-22.2) 60-69 (803-140) (3.7-18.9) 70-     (843-180) (2.2-14.7)         No follow-ups on file.  Clotilda Cornwall, PA-C   [facility] [address] [place], [place] [address] [phone]

## 2024-06-17 ENCOUNTER — Ambulatory Visit: Admitting: Urology

## 2024-06-17 DIAGNOSIS — N529 Male erectile dysfunction, unspecified: Secondary | ICD-10-CM

## 2024-06-17 DIAGNOSIS — E291 Testicular hypofunction: Secondary | ICD-10-CM

## 2024-06-26 ENCOUNTER — Telehealth: Payer: Self-pay

## 2024-06-26 ENCOUNTER — Telehealth: Payer: Self-pay | Admitting: Internal Medicine

## 2024-06-26 NOTE — Telephone Encounter (Signed)
 Disability forms received from patient for provider to complete placed in providers in box

## 2024-06-26 NOTE — Telephone Encounter (Signed)
 See previous message already open about this

## 2024-06-26 NOTE — Telephone Encounter (Signed)
 Spoke to pt. He did not have enough Sick Leave when he had to stay out with Covid so he needs Short Term Disability to help cover those days off. He was out 06-02-24 to 06-11-24.   Forms are due to be faxed no later than 07-03-24. He has a portion he knows he needs to sign before they can be faxed back.   I have filled out everything, but ask that you look it over to make sure I did not miss anything or fill it out wrong.   Would you mind doing them for him since Dr Jimmy is gone? I can bring them to you and will attach a charge form.

## 2024-06-26 NOTE — Telephone Encounter (Signed)
 Left message to call office. We received disability forms from his employer. There was no information with it about what he was needing disability for. Is this related to when he was out with Covid? If so, what dates was he out?

## 2024-06-26 NOTE — Telephone Encounter (Signed)
 That is fine I can do it in the am

## 2024-06-27 DIAGNOSIS — Z0279 Encounter for issue of other medical certificate: Secondary | ICD-10-CM

## 2024-06-27 NOTE — Telephone Encounter (Signed)
 Spoke to pt. He is coming to the office now to sign his part and pay the $29 fee. We can fax it once he signs it. It is on the bottom shelf of the black file holder on my desk.

## 2024-06-27 NOTE — Telephone Encounter (Signed)
 Signed and in IN box  Thanks

## 2024-07-01 NOTE — Progress Notes (Signed)
 07/05/2024 10:46 AM  Randall Pace [birth year] [id number]   Referring provider: Jimmy Charlie FERNS, MD No address on file  Urological history: 1. Elevated PSA - PSA (05/2024) 3.0   2. ED - failed PDE5i's - testosterone  level (05/2024) 237 - testosterone  level (04/2024) 191   3. BPH with LU TS  - PSA (05/2024) 3.0 - prostate volume ~ 34 cc - PSAD 0.088  4. Family history of prostate cancer - father with prostate cancer, colon cancer, and leukemia  Chief Complaint  Patient presents with   Erectile Dysfunction   HPI: Randall Pace is a 68 y.o. male who presents today for discussion on Trimix being ineffective and low testosterone  levels.    Previous records reviewed.     He has been experiencing issues with ED for several years.   He is having difficulty with achieving and maintaining erections.  He is no longer having nocturnal tumescence or having morning erections.   He reports persistent ED despite the use of PDE5i's.  No history of priapism, Peyronie's disease or penile trauma.   He has not had satisfactory erections with Trimix (30/1/10).  He is wanted to try a higher potency.    Patient is not having spontaneous erections.   He denies any pain or curvature with erections.    His latest two morning testosterones have returned below 300.    There is also been a decreased in his libido and issues with erectile dysfunction.  He is a type 2 diabetes mellitus.    Hbg A1c (05/2024) 6.8   Physical Exam:  BP 124/78 (BP Location: Left Arm, Patient Position: Sitting, Cuff Size: Large)   Pulse 88   Ht 5' 6 (1.676 m)   Wt 194 lb 6.4 oz (88.2 kg)   BMI 31.38 kg/m   Constitutional:  Well nourished. Alert and oriented, No acute distress. HEENT: [place] AT, moist mucus membranes.  Trachea midline Cardiovascular: No clubbing, cyanosis, or edema. Respiratory: Normal respiratory effort, no increased work of breathing. Neurologic: Grossly intact, no focal deficits, moving  all 4 extremities. Psychiatric: Normal mood and affect.   Assessment & Plan:    1.  Erectile dysfunction - we discussed a second titration trial with Trimix (30/1/50) and he is agreeable - prescription is sent and he is scheduled  2. Hypogonadism - explained that TRT is not a treatment for ED, he may see some improvement in his erections, but his ED will likely persist even with therapeutic levels of testosterone  - discussed potential side effects of testosterone  replacement  including stimulation of erythrocytosis; edema; gynecomastia; worsening sleep apnea; venous thromboembolism; testicular atrophy and infertility.   The theoretical risk of growth stimulation of an undetected prostate cancer was also discussed.  He was informed that current evidence does not provide any definitive answers regarding the risks of testosterone  therapy on prostate cancer and cardiovascular disease. The need for periodic monitoring of his testosterone  level, PSA, hematocrit and DRE was discussed.  This monitoring will be conducted every three months during the first year of TRT and then every 6 months if blood work remains stable, if there is an abnormality found in follow up blood work, it will result in the monitoring of blood work more frequently  - he has had a recent increase in velocity, but it is still within the acceptable range of not more than 0.75  year  - we discussed that if he decided to go forward with testosterone  replacement therapy, we could either watch his PSA  closely and if it increases, stop TRT and investigate further into why the PSA is increasing with prostate MRI/biopsy, we could obtain a prostate MRI at this time - he would like to see if the higher potency Trimix will give him satisfactory erections before consider TRT as he had concerns in regards with his family history of prostate cancer     Return for return for higher potency Trimix titration .  Clotilda Cornwall, PA-C   [facility] [address] [place], [place] [address] [phone]

## 2024-07-01 NOTE — Telephone Encounter (Signed)
 Patient arrived in office today and completed patient portion of ppwk, scanned in for billing to post 29 charge. Faxed documents

## 2024-07-02 ENCOUNTER — Encounter: Payer: Self-pay | Admitting: Urology

## 2024-07-02 ENCOUNTER — Ambulatory Visit (INDEPENDENT_AMBULATORY_CARE_PROVIDER_SITE_OTHER): Admitting: Urology

## 2024-07-02 VITALS — BP 124/78 | HR 88 | Ht 66.0 in | Wt 194.4 lb

## 2024-07-02 DIAGNOSIS — N529 Male erectile dysfunction, unspecified: Secondary | ICD-10-CM | POA: Diagnosis not present

## 2024-07-02 DIAGNOSIS — E291 Testicular hypofunction: Secondary | ICD-10-CM

## 2024-07-02 MED ORDER — AMBULATORY NON FORMULARY MEDICATION: 0 refills | Status: DC

## 2024-07-06 NOTE — Progress Notes (Signed)
   07/08/2024 11:53 AM  Randall Pace 30-Oct-1956 [id number]   Referring provider: Jimmy Charlie FERNS, MD No address on file  Urological history: 1. Elevated PSA - PSA (05/2024) 3.0   2. ED - failed PDE5i's - testosterone  level (05/2024) 237 - testosterone  level (04/2024) 191   3. BPH with LU TS  - PSA (05/2024) 3.0 - prostate volume ~ 34 cc - PSAD 0.088  4. Family history of prostate cancer - father with prostate cancer, colon cancer, and leukemia  Chief Complaint  Patient presents with   Erectile Dysfunction   HPI: Polo Mcmartin is a 68 y.o. male who presents today for Trimix (301/50) titration.      Previous records reviewed.     He has been experiencing issues with ED for several years.   He is having difficulty with achieving and maintaining erections.  He is no longer having nocturnal tumescence or having morning erections.   He reports persistent ED despite the use of PDE5i's.  No history of priapism, Peyronie's disease or penile trauma.   He has not had satisfactory erections with Trimix (30/1/10).  He is wanted to try a higher potency.    Patient is not having spontaneous erections.   He denies any pain or curvature with erections.    His latest two morning testosterones have returned below 300.    There is also been a decreased in his libido and issues with erectile dysfunction.  He is a type 2 diabetes mellitus.    Hbg A1c (05/2024) 6.8  Unfortunately, he did not bring the Trimix medication with him, so we will need to reschedule.  Physical Exam:  BP 126/80 (BP Location: Left Arm, Patient Position: Sitting, Cuff Size: Large)   Pulse 92   Ht 5' 6 (1.676 m)   Wt 195 lb (88.5 kg)   BMI 31.47 kg/m   Constitutional:  Well nourished. Alert and oriented, No acute distress.   Assessment & Plan:    1.  Erectile dysfunction - reschedule Trimix titration appointment - [facility], they did not have the prescription for the high  potency Trimix, so I refaxed the prescription -Advised patient of the condition of priapism, painful erection lasting for more than four hours, and to contact the office or seek treatment in the ED immediately  No follow-ups on file.  Clotilda Cornwall, PA-C   [facility] [address] [place], [place] [address] [phone]

## 2024-07-08 ENCOUNTER — Ambulatory Visit (INDEPENDENT_AMBULATORY_CARE_PROVIDER_SITE_OTHER): Admitting: Urology

## 2024-07-08 ENCOUNTER — Encounter: Payer: Self-pay | Admitting: Urology

## 2024-07-08 VITALS — BP 126/80 | HR 92 | Ht 66.0 in | Wt 195.0 lb

## 2024-07-08 DIAGNOSIS — E291 Testicular hypofunction: Secondary | ICD-10-CM

## 2024-07-08 DIAGNOSIS — N529 Male erectile dysfunction, unspecified: Secondary | ICD-10-CM

## 2024-07-08 MED ORDER — AMBULATORY NON FORMULARY MEDICATION: 0 refills | Status: AC

## 2024-07-23 ENCOUNTER — Ambulatory Visit: Attending: Cardiology | Admitting: Cardiology

## 2024-07-23 ENCOUNTER — Encounter: Payer: Self-pay | Admitting: Cardiology

## 2024-07-23 VITALS — BP 124/66 | HR 82 | Ht 66.0 in | Wt 195.2 lb

## 2024-07-23 DIAGNOSIS — I350 Nonrheumatic aortic (valve) stenosis: Secondary | ICD-10-CM | POA: Diagnosis not present

## 2024-07-23 DIAGNOSIS — I10 Essential (primary) hypertension: Secondary | ICD-10-CM | POA: Diagnosis not present

## 2024-07-23 DIAGNOSIS — E785 Hyperlipidemia, unspecified: Secondary | ICD-10-CM | POA: Diagnosis not present

## 2024-07-23 DIAGNOSIS — E1169 Type 2 diabetes mellitus with other specified complication: Secondary | ICD-10-CM

## 2024-07-23 NOTE — Progress Notes (Signed)
 Cardiology Office Note:  .   Date:  07/23/2024  ID:  Randall Pace, DOB 04-19-1956, MRN [id number] PCP: Jimmy Charlie FERNS, MD  [facility] Providers Cardiologist:  None     History of Present Illness: .   Randall Pace is a 68 y.o. male Discussed the use of AI scribe software for clinical note transcription with the patient, who gave verbal consent to proceed.  History of Present Illness Randall Pace is a 68 year old male with aortic stenosis who presents for follow-up evaluation.  He has a history of mild to moderate aortic valve stenosis, with an echocardiogram on 02/16/22 showing an ejection fraction of 65%, a mean aortic valve gradient of 19.2 mmHg, and a Vmax of 2.9 meters per second. He has been evaluated for chest pain and has a three over six systolic murmur. He experiences palpitations.  He is on valsartan /hydrochlorothiazide  320/25 mg and verapamil  360 mg for blood pressure management. For diabetes, he takes Farxiga  5 mg, glipizide  10 mg, and Mounjaro  injections. Despite these medications, his blood sugar is not well controlled, although his hemoglobin A1c has improved to 6.8 from over 12.  He experiences numbness in his fingers and feet, particularly when in certain positions, such as lying in bed with his arms elevated. He describes the sensation as his hand getting 'stuck' and needing to be put down.  His LDL cholesterol is 103, ALT is 12, and creatinine is 1.32. He recalls his hemoglobin A1c was previously over 12, indicating significant improvement in his diabetes management.  He works for [employer] and describes the company as a great place to work. He notes stiffness and tension in his neck area. No current problems and feels good overall.     Studies Reviewed: SABRA   EKG Interpretation Date/Time:  Wednesday July 23 2024 09:16:26 EDT Ventricular Rate:  82 PR Interval:  170 QRS Duration:  88 QT Interval:  336 QTC Calculation: 392 R Axis:   37  Text  Interpretation: Normal sinus rhythm Minimal voltage criteria for LVH, may be normal variant ( R in aVL ) Nonspecific T wave abnormality When compared with ECG of 29-Jun-2022 00:41, No significant change was found Confirmed by Jeffrie Anes ([id number]) on 07/23/2024 9:26:12 AM    Results LABS HbA1c: 6.8 LDL: 103 ALT: 12 Creatinine: 1.32  DIAGNOSTIC Echocardiogram: Mild to moderate aortic valve stenosis, ejection fraction 65%, mean aortic valve gradient 19.2 mmHg, Vmax 2.9 m/s (02/16/2022) Nuclear stress test: No ischemia (02/16/2022) EKG: Normal (07/23/2024) Risk Assessment/Calculations:            Physical Exam:   VS:  BP 124/66   Pulse 82   Ht 5' 6 (1.676 m)   Wt 195 lb 3.2 oz (88.5 kg)   SpO2 99%   BMI 31.51 kg/m    Wt Readings from Last 3 Encounters:  07/23/24 195 lb 3.2 oz (88.5 kg)  07/08/24 195 lb (88.5 kg)  07/02/24 194 lb 6.4 oz (88.2 kg)    GEN: Well nourished, well developed in no acute distress NECK: No JVD; No carotid bruits CARDIAC: RRR, 3/6 systolic murmurs, no rubs, no gallops RESPIRATORY:  Clear to auscultation without rales, wheezing or rhonchi  ABDOMEN: Soft, non-tender, non-distended EXTREMITIES:  No edema; No deformity   ASSESSMENT AND PLAN: .    Assessment and Plan Assessment & Plan Aortic valve stenosis Mild to moderate aortic valve stenosis with a mean gradient of 19.2 mmHg and Vmax of 2.9 m/s. A 3/6 systolic murmur is present. Regular  monitoring is necessary with potential future valve replacement. - Order echocardiogram to assess current status of aortic valve stenosis - Schedule follow-up in one year to monitor condition  Type 2 diabetes mellitus Type 2 diabetes mellitus with good control (Hemoglobin A1c 6.8). Reports frustration with weight management and neuropathy symptoms such as numbness in fingers and feet. - Continue current diabetes medications: Farxiga , glipizide , and Mounjaro  - Discuss neuropathy symptoms with primary care provider for  further evaluation and management  Hypertension Hypertension well-controlled with valsartan /hydrochlorothiazide  and verapamil . Blood pressure is 124/66 mmHg with a heart rate of 82 bpm. - Continue current antihypertensive medications: valsartan /hydrochlorothiazide  and verapamil   Hyperlipidemia Hyperlipidemia with good control (LDL level of 103 mg/dL).         Dispo: 1 yr  Signed, Oneil Parchment, MD

## 2024-07-23 NOTE — Progress Notes (Signed)
 ekg

## 2024-07-23 NOTE — Patient Instructions (Signed)
 Medication Instructions:  The current medical regimen is effective;  continue present plan and medications.  *If you need a refill on your cardiac medications before your next appointment, please call your pharmacy*  Testing/Procedures: Your physician has requested that you have an echocardiogram. Echocardiography is a painless test that uses sound waves to create images of your heart. It provides your doctor with information about the size and shape of your heart and how well your heart's chambers and valves are working. This procedure takes approximately one hour. There are no restrictions for this procedure. Please do NOT wear cologne, perfume, aftershave, or lotions (deodorant is allowed). Please arrive 15 minutes prior to your appointment time.  Please note: We ask at that you not bring children with you during ultrasound (echo/ vascular) testing. Due to room size and safety concerns, children are not allowed in the ultrasound rooms during exams. Our front office staff cannot provide observation of children in our lobby area while testing is being conducted. An adult accompanying a patient to their appointment will only be allowed in the ultrasound room at the discretion of the ultrasound technician under special circumstances. We apologize for any inconvenience.   Follow-Up: At [facility], you and your health needs are our priority.  As part of our continuing mission to provide you with exceptional heart care, our providers are all part of one team.  This team includes your primary Cardiologist (physician) and Advanced Practice Providers or APPs (Physician Assistants and Nurse Practitioners) who all work together to provide you with the care you need, when you need it.  Your next appointment:   1 year(s)  Provider:   One of our Advanced Practice Providers (APPs): Morse Clause, PA-C  Lamarr Satterfield, NP Miriam Shams, NP  Olivia Pavy, PA-C Josefa Beauvais, NP  Leontine Salen,  PA-C Orren Fabry, PA-C  Blackey, PA-C Ernest Dick, NP  Damien Braver, NP Jon Hails, PA-C  Waddell Donath, PA-C    Dayna Dunn, PA-C  Scott Weaver, PA-C Lum Louis, NP Katlyn West, NP Callie Goodrich, PA-C  Xika Zhao, NP Sheng Haley, PA-C    Kathleen Johnson, PA-C       We recommend signing up for the patient portal called MyChart.  Sign up information is provided on this After Visit Summary.  MyChart is used to connect with patients for Virtual Visits (Telemedicine).  Patients are able to view lab/test results, encounter notes, upcoming appointments, etc.  Non-urgent messages can be sent to your provider as well.   To learn more about what you can do with MyChart, go to [email or web address].

## 2024-08-27 ENCOUNTER — Telehealth (HOSPITAL_COMMUNITY): Payer: Self-pay | Admitting: Cardiology

## 2024-08-27 ENCOUNTER — Ambulatory Visit (HOSPITAL_COMMUNITY): Admission: RE | Admit: 2024-08-27 | Source: Ambulatory Visit | Attending: Cardiology | Admitting: Cardiology

## 2024-08-27 NOTE — Telephone Encounter (Signed)
 Patient NO SHOWED echocardiogram on 08/27/24. I called patient and he states that he doesn't know how much money it will cost and if he can even afford. I sent a message to the Billing Department for patient and after they call him he will decide to reschedule if he can handle the financial part. Order will be removed from the echo WQ and if patient calls back we will reinstate the order. Thank you.

## 2024-08-28 ENCOUNTER — Ambulatory Visit: Admitting: Urology

## 2024-08-29 DIAGNOSIS — E113293 Type 2 diabetes mellitus with mild nonproliferative diabetic retinopathy without macular edema, bilateral: Secondary | ICD-10-CM | POA: Diagnosis not present

## 2024-08-29 DIAGNOSIS — H524 Presbyopia: Secondary | ICD-10-CM | POA: Diagnosis not present

## 2024-08-29 LAB — OPHTHALMOLOGY REPORT-SCANNED

## 2024-09-17 ENCOUNTER — Ambulatory Visit (INDEPENDENT_AMBULATORY_CARE_PROVIDER_SITE_OTHER)

## 2024-09-17 VITALS — BP 130/78 | HR 78 | Temp 98.0°F | Ht 66.0 in | Wt 198.0 lb

## 2024-09-17 DIAGNOSIS — K5900 Constipation, unspecified: Secondary | ICD-10-CM | POA: Diagnosis not present

## 2024-09-17 DIAGNOSIS — G44009 Cluster headache syndrome, unspecified, not intractable: Secondary | ICD-10-CM

## 2024-09-17 MED ORDER — PREDNISONE 10 MG PO TABS: ORAL_TABLET | ORAL | 0 refills | Status: AC

## 2024-09-17 MED ORDER — MAGNESIUM HYDROXIDE 400 MG/5ML PO SUSP: 10.0000 mL | Freq: Every day | ORAL | 0 refills | Status: DC | PRN

## 2024-09-17 MED ORDER — SENNA 8.6 MG PO TABS: 2.0000 | ORAL_TABLET | Freq: Every day | ORAL | 0 refills | Status: AC

## 2024-09-17 MED ORDER — SUMATRIPTAN SUCCINATE 50 MG PO TABS: 50.0000 mg | ORAL_TABLET | ORAL | 0 refills | Status: DC | PRN

## 2024-09-17 NOTE — Patient Instructions (Signed)
 Thank you for visiting [facility] today! Here's what we talked about: - START Milk magnesia today then senna nightly from tomorrow. Imitrex whenevcr you feel headache starting. Start steroid today.

## 2024-09-17 NOTE — Progress Notes (Signed)
 Subjective:   This visit was conducted in person. The patient gave informed consent to the use of Abridge AI technology to record the contents of the encounter as documented below.   Patient ID: Randall Pace, male    DOB: January 17, 1956, 67 y.o.   MRN: [id number]   Discussed the use of AI scribe software for clinical note transcription with the patient, who gave verbal consent to proceed.  History of Present Illness Randall Pace is a 67 year old male with cluster migraines who presents with recurrent headaches.  He has been experiencing recurrent headaches over the past few weeks, primarily located in the right eye and radiating to the right side of the head. The pain is described as sometimes feeling like a 'trench' and is reminiscent of cluster migraines he experienced in his twenties. During those episodes, he managed the headaches by taking over-the-counter medications like Motrin or Tylenol at the onset of symptoms, which prevented progression to severe migraines. If treatment was delayed, the headaches became debilitating, causing his eye to close, vomiting, and requiring him to lie in a dark room.  In the past few weeks, the headaches have been occurring almost daily, with a pattern of several consecutive days of headaches followed by a day without. He did not experience a headache yesterday or today, attributing this to better sleep. He describes himself as a 'terrible sleeper,' often waking up at 2 AM and engaging in activities because he feels it is a waste of time to lie awake. He also mentions experiencing a 'prodrome' sensation, indicating a headache might be developing, but not yet fully manifesting.  No nausea or vomiting with the current headaches, but he reports a 'vomit taste' which he attributes to acid reflux. His eyes water during the headaches, and he experiences a constant nasal drip that causes coughing. No arm or leg weakness, numbness, or tingling during the headaches,  but he mentions 'funny feelings' in his chest area, which he does not classify as pain.  He reports chronic constipation, with bowel movements occurring every four days. The process is laborious, requiring significant effort and breathing techniques to facilitate bowel movements. He has previously used MiraLAX , which caused inconvenient timing of bowel movements, and he wants a solution that allows for regularity without such side effects.  He is currently taking verapamil  and believes his headaches might be related to neck tension. He drinks water regularly and enjoys fruits but does not consume oatmeal frequently, opting for grits instead.   Review of Systems  All other systems reviewed and are negative.       No Known Allergies  Current Outpatient Medications on File Prior to Visit  Medication Sig Dispense Refill   AMBULATORY NON FORMULARY MEDICATION Trimix (30/1/50)-(Pap/Phent/PGE)  Test Dose  1ml vial   Qty #3 Refills 0  [facility] [phone] Fax [phone] 3 vial 0   aspirin 81 MG tablet Take 81 mg by mouth daily.     dapagliflozin  propanediol (FARXIGA ) 5 MG TABS tablet TAKE 1 TABLET BY MOUTH EVERY DAY BEFORE BREAKFAST 90 tablet 1   fenofibrate  54 MG tablet TAKE 1 TABLET(54 MG) BY MOUTH DAILY 90 tablet 3   fexofenadine (ALLEGRA) 180 MG tablet Take 180 mg by mouth daily.     gabapentin  (NEURONTIN ) 300 MG capsule Take 300 mg by mouth 2 (two) times daily.     glipiZIDE  (GLUCOTROL ) 10 MG tablet TAKE 1 TABLET (10 MG TOTAL) BY MOUTH TWICE A DAY BEFORE A MEAL 180 tablet  3   ipratropium (ATROVENT) 0.06 % nasal spray PLACE 2 SPRAYS IN EACH NOSTRIL TWICE A DAY 45 mL 1   omeprazole  (PRILOSEC) 20 MG capsule TAKE 1 CAPSULE BY MOUTH EVERY DAY 90 capsule 3   tirzepatide  (MOUNJARO ) 12.5 MG/0.5ML Pen Inject 12.5 mg into the skin once a week. 6 mL 3   valsartan -hydrochlorothiazide  (DIOVAN -HCT) 320-25 MG tablet TAKE 1 TABLET BY MOUTH EVERY DAY 90 tablet 3   verapamil  (VERELAN )  360 MG 24 hr capsule TAKE 1 CAPSULE (360 MG TOTAL) BY MOUTH AT BEDTIME. 90 capsule 3   No current facility-administered medications on file prior to visit.    BP 130/78 (BP Location: Left Arm, Patient Position: Sitting, Cuff Size: Large)   Pulse 78   Temp 98 F (36.7 C) (Oral)   Ht 5' 6 (1.676 m)   Wt 198 lb (89.8 kg)   SpO2 96%   BMI 31.96 kg/m   Objective:      Physical Exam GENERAL: Alert, cooperative, well developed, no acute distress. Obese HEAD: Normocephalic atraumatic. EYES: Extraocular movements intact BL, pupils round, equal and reactive to light BL, conjunctivae normal BL. EXTREMITIES: No cyanosis or edema. NEUROLOGICAL: Oriented to person, place and time, no gait abnormalities, moves all extremities without gross motor or sensory deficit.       Assessment & Plan:   Assessment & Plan Cluster headache syndrome, right-sided Recurrent right-sided cluster headaches with episodes lasting several days then briefly remitting before starting again, over the past 2 weeks. Symptoms include right eye pain associated. No neurological deficits. Presentation aligns with cluster headaches rather than migraine.  - Prescribed steroid taper starting at 60 mg daily for 3 days, then taper over 18 days as preventative treatment - Prescribed sumatriptan  for acute episodes, one tablet at onset, second if needed after 2 hours, max two tablets per day, not more than twice a week. - Scheduled follow-up in 2 weeks to assess treatment response. - Continue verapamil  360 mg daily  Constipation Chronic constipation with infrequent bowel movements and straining.  - Prescribed Senna 2 tablets nightly - Advised Milk of Magnesia tonight to initiate bowel movement. - Encouraged increased dietary fiber intake, especially oatmeal.    Return in about 2 weeks (around 10/01/2024) for Headaches then 4-6 weeks for TOC.   [name], MD  09/17/24     Contains text generated by Abridge.

## 2024-10-01 ENCOUNTER — Ambulatory Visit

## 2024-10-01 ENCOUNTER — Telehealth: Payer: Self-pay

## 2024-10-01 ENCOUNTER — Ambulatory Visit: Admission: RE | Admit: 2024-10-01 | Discharge: 2024-10-01 | Disposition: A | Source: Ambulatory Visit

## 2024-10-01 VITALS — BP 122/64 | HR 84 | Temp 97.8°F | Ht 66.0 in | Wt 196.0 lb

## 2024-10-01 DIAGNOSIS — I350 Nonrheumatic aortic (valve) stenosis: Secondary | ICD-10-CM

## 2024-10-01 DIAGNOSIS — G44009 Cluster headache syndrome, unspecified, not intractable: Secondary | ICD-10-CM | POA: Diagnosis not present

## 2024-10-01 DIAGNOSIS — J3089 Other allergic rhinitis: Secondary | ICD-10-CM

## 2024-10-01 DIAGNOSIS — R053 Chronic cough: Secondary | ICD-10-CM

## 2024-10-01 DIAGNOSIS — R059 Cough, unspecified: Secondary | ICD-10-CM | POA: Diagnosis not present

## 2024-10-01 MED ORDER — FLUTICASONE PROPIONATE 50 MCG/ACT NA SUSP: 2.0000 | Freq: Every day | NASAL | 6 refills | Status: AC

## 2024-10-01 NOTE — Telephone Encounter (Signed)
 Pt reportedly not good with using MyChart, pls call pt and update that xray was normal, lets see if cough improves with the measures he discussed, we will also see how his lung function tests go

## 2024-10-01 NOTE — Progress Notes (Unsigned)
 Subjective:   This visit was conducted in person. The patient gave informed consent to the use of Abridge AI technology to record the contents of the encounter as documented below.   Patient ID: Randall Pace, male    DOB: [birth year], 68 y.o.   MRN: [id number]   Discussed the use of AI scribe software for clinical note transcription with the patient, who gave verbal consent to proceed.  History of Present Illness Randall Pace is a 68 year old male with a history of persistent cough, allergies, and acid reflux who presents with persistent cough and headache management.  He has experienced a persistent cough for years, characterized by a tickling sensation in his throat and a feeling of congestion in his chest, exacerbated by deep breaths or laughter. The cough is not typically worse at night, but he does cough upon waking. He has a history of allergies and used Atrovent nasal spray daily until recently, which he believes helped with the throat tickle. Sudafed has been tried without significant relief. He experiences constant post-nasal drip and has a history of surgery by an ENT, which he believes may have contributed to his symptoms.  He has a history of acid reflux, reporting belching and a bad taste in his mouth, sometimes with food regurgitation. He is currently taking omeprazole  20 mg daily.  He experiences intermittent headaches, often feeling a 'twinge' that dissipates without developing into a full headache. He has been prescribed prednisone  in the past but took it incorrectly, leading to leftover medication. He uses sumatriptan  as needed for headache relief.  He reports itching on his back and front, which he describes as intense and widespread, but there is no visible rash. He has not been taking Allegra regularly and is unsure if it helps with the itching. He has Zyrtec  at home but is uncertain if it alleviates the itching.  He mentions being a 'terrible sleeper' and needing  glasses, which he has ordered but not yet received.   Review of Systems  All other systems reviewed and are negative.       No Known Allergies  Current Outpatient Medications on File Prior to Visit  Medication Sig Dispense Refill   AMBULATORY NON FORMULARY MEDICATION Trimix (30/1/50)-(Pap/Phent/PGE)  Test Dose  1ml vial   Qty #3 Refills 0  [facility] [phone] Fax [phone] 3 vial 0   aspirin 81 MG tablet Take 81 mg by mouth daily.     dapagliflozin  propanediol (FARXIGA ) 5 MG TABS tablet TAKE 1 TABLET BY MOUTH EVERY DAY BEFORE BREAKFAST 90 tablet 1   fenofibrate  54 MG tablet TAKE 1 TABLET(54 MG) BY MOUTH DAILY 90 tablet 3   gabapentin  (NEURONTIN ) 300 MG capsule Take 300 mg by mouth 2 (two) times daily.     glipiZIDE  (GLUCOTROL ) 10 MG tablet TAKE 1 TABLET (10 MG TOTAL) BY MOUTH TWICE A DAY BEFORE A MEAL 180 tablet 3   magnesium  hydroxide (MILK OF MAGNESIA) 400 MG/5ML suspension Take 10 mLs by mouth daily as needed for mild constipation. 118 mL 0   omeprazole  (PRILOSEC) 20 MG capsule TAKE 1 CAPSULE BY MOUTH EVERY DAY 90 capsule 3   predniSONE  (DELTASONE ) 10 MG tablet Take 6 tablets (60 mg total) by mouth daily with breakfast for 3 days, THEN 5 tablets (50 mg total) daily with breakfast for 3 days, THEN 4 tablets (40 mg total) daily with breakfast for 3 days, THEN 3 tablets (30 mg total) daily with breakfast for 3 days, THEN 2 tablets (  20 mg total) daily with breakfast for 3 days, THEN 1 tablet (10 mg total) daily with breakfast for 3 days. 63 tablet 0   senna (SENOKOT) 8.6 MG TABS tablet Take 2 tablets (17.2 mg total) by mouth at bedtime. 60 tablet 0   SUMAtriptan  (IMITREX ) 50 MG tablet Take 1 tablet (50 mg total) by mouth every 2 (two) hours as needed for migraine. May repeat in 2 hours if headache persists or recurs. 30 tablet 0   tirzepatide  (MOUNJARO ) 12.5 MG/0.5ML Pen Inject 12.5 mg into the skin once a week. 6 mL 3   valsartan -hydrochlorothiazide  (DIOVAN -HCT)  320-25 MG tablet TAKE 1 TABLET BY MOUTH EVERY DAY 90 tablet 3   verapamil  (VERELAN ) 360 MG 24 hr capsule TAKE 1 CAPSULE (360 MG TOTAL) BY MOUTH AT BEDTIME. 90 capsule 3   No current facility-administered medications on file prior to visit.    BP 122/64 (BP Location: Left Arm, Patient Position: Sitting, Cuff Size: Large)   Pulse 84   Temp 97.8 F (36.6 C) (Oral)   Ht 5' 6 (1.676 m)   Wt 196 lb (88.9 kg)   SpO2 98%   BMI 31.64 kg/m   Objective:      Physical Exam GENERAL: Alert, cooperative, well developed, no acute distress. HEAD: Normocephalic atraumatic. EYES: Extraocular movements intact BL, pupils round, equal and reactive to light BL, conjunctivae normal BL. NOSE: No congestion or rhinorrhea, mucous membranes are moist. THROAT: No oropharyngeal exudate or posterior oropharyngeal erythema. CARDIOVASCULAR: Normal heart rate and rhythm, murmur present CHEST: Clear to auscultation bilaterally, no wheezes, rhonchi, or crackles. ABDOMEN: Soft, non tender, non distended, without organomegaly, normal bowel sounds. EXTREMITIES: No cyanosis or edema. NEUROLOGICAL: Oriented to person, place and time, no gait abnormalities, moves all extremities without gross motor or sensory deficit. SKIN: No rash observed on the back.       Assessment & Plan:   Assessment & Plan Chronic cough Persistent, chronic cough with throat tickle and subjective sense of chest congestion. Possible allergies vs GERD, vs asthma given reports of it in childhood. Previous Atrovent nasal spray provided some relief. Discussed trial of increased omeprazole  dosage, Flonase  trial, and potential pulmonology referral if symptoms persist.  - Start Flonase  nasal spray, two sprays in each nostril daily. - Increase omeprazole  to 40 mg three times weekly and 20 mg daily on other days (not agreeable to take increased dose daily). - Ordered chest x-ray. - Ordered lung function tests. - Follow up in three weeks to reassess  cough and review test results.  Chronic allergic rhinitis May be contributing to Chronic nasal congestion and throat tickle. Flonase  recommended for better management.  - Start Flonase  nasal spray, two sprays in each nostril daily. - Discontinued Atrovent nasal spray.  Cluster headache syndrome, right-sided Intermittent right-sided headaches. Previous prednisone  taper was taken incorrectly and now self discontinued. Sumatriptan  available for acute management.  - Use sumatriptan  as needed for headache twinges. - Keep prednisone  for future use if headaches become frequent.  Chronic pruritus Chronic, Generalized itching without rash. Possible allergic etiology. Previous Allegra ineffective. Zyrtec  recommended for trial. - Start Zyrtec  10 mg daily for itching.  Non-rheumatic aortic stenosis Murmur present exam today, patient is asymptomatic. Per chart review, he was evaluated by cardiology on 07/23/2024.  Most recent echo from 10/03/2024 showed moderate aortic stenosis with mean gradient of 31 mmHg, and increased from prior, recommendations per Dr. Jeffrie is to repeat echo in 1 year.  - Pt to continue yearly cardiology follow up  Return in about 3 weeks (around 10/22/2024) for Cough.   Randall Gohlke K Essense Bousquet, MD  10/01/24     Contains text generated by Abridge.

## 2024-10-01 NOTE — Patient Instructions (Addendum)
 Thank you for visiting [facility] today! Here's what we talked about: Start Flonase  2 sprays in the nostrils daily, stop Atrovent Take 40mg  (2 tablet) omeprazole  three times weekly Take zyrtec  1 tablet daily for itching Use Sumatriptan  as needed for headaches Call [facility] at [phone] to set up appointment

## 2024-10-02 NOTE — Telephone Encounter (Signed)
Left detailed message on VM per DPR. 

## 2024-10-03 ENCOUNTER — Ambulatory Visit (HOSPITAL_COMMUNITY): Admission: RE | Admit: 2024-10-03 | Discharge: 2024-10-03 | Attending: Cardiology | Admitting: Cardiology

## 2024-10-03 DIAGNOSIS — I350 Nonrheumatic aortic (valve) stenosis: Secondary | ICD-10-CM | POA: Diagnosis not present

## 2024-10-03 LAB — ECHOCARDIOGRAM COMPLETE
AR max vel: 1.3 cm2
AV Area VTI: 1.2 cm2
AV Area mean vel: 1.39 cm2
AV Mean grad: 31 mmHg
AV Peak grad: 51.6 mmHg
Ao pk vel: 3.59 m/s
Area-P 1/2: 3.23 cm2
Est EF: >75
P 1/2 time: 605 ms
S' Lateral: 2.3 cm

## 2024-10-05 ENCOUNTER — Ambulatory Visit: Payer: Self-pay | Admitting: Cardiology

## 2024-10-05 DIAGNOSIS — I350 Nonrheumatic aortic (valve) stenosis: Secondary | ICD-10-CM

## 2024-10-08 ENCOUNTER — Ambulatory Visit: Payer: Self-pay

## 2024-10-27 ENCOUNTER — Other Ambulatory Visit: Payer: Self-pay

## 2024-10-27 ENCOUNTER — Ambulatory Visit

## 2024-10-27 DIAGNOSIS — R972 Elevated prostate specific antigen [PSA]: Secondary | ICD-10-CM

## 2024-11-18 ENCOUNTER — Ambulatory Visit

## 2024-11-18 ENCOUNTER — Telehealth: Payer: Self-pay

## 2024-11-18 VITALS — BP 126/60 | HR 92 | Temp 98.1°F | Ht 66.0 in | Wt 201.0 lb

## 2024-11-18 DIAGNOSIS — E7849 Other hyperlipidemia: Secondary | ICD-10-CM | POA: Diagnosis not present

## 2024-11-18 DIAGNOSIS — E119 Type 2 diabetes mellitus without complications: Secondary | ICD-10-CM | POA: Diagnosis not present

## 2024-11-18 DIAGNOSIS — R944 Abnormal results of kidney function studies: Secondary | ICD-10-CM

## 2024-11-18 DIAGNOSIS — G4733 Obstructive sleep apnea (adult) (pediatric): Secondary | ICD-10-CM | POA: Diagnosis not present

## 2024-11-18 DIAGNOSIS — Z7985 Long-term (current) use of injectable non-insulin antidiabetic drugs: Secondary | ICD-10-CM | POA: Diagnosis not present

## 2024-11-18 DIAGNOSIS — N6310 Unspecified lump in the right breast, unspecified quadrant: Secondary | ICD-10-CM | POA: Diagnosis not present

## 2024-11-18 DIAGNOSIS — R053 Chronic cough: Secondary | ICD-10-CM | POA: Diagnosis not present

## 2024-11-18 DIAGNOSIS — J3089 Other allergic rhinitis: Secondary | ICD-10-CM

## 2024-11-18 DIAGNOSIS — G4709 Other insomnia: Secondary | ICD-10-CM

## 2024-11-18 LAB — POCT GLYCOSYLATED HEMOGLOBIN (HGB A1C): Hemoglobin A1C: 7.2 % — AB (ref 4.0–5.6)

## 2024-11-18 LAB — BASIC METABOLIC PANEL WITH GFR
BUN: 24 mg/dL — ABNORMAL HIGH (ref 6–23)
CO2: 28 meq/L (ref 19–32)
Calcium: 10.2 mg/dL (ref 8.4–10.5)
Chloride: 101 meq/L (ref 96–112)
Creatinine, Ser: 1.72 mg/dL — ABNORMAL HIGH (ref 0.40–1.50)
GFR: 40.52 mL/min — ABNORMAL LOW
Glucose, Bld: 106 mg/dL — ABNORMAL HIGH (ref 70–99)
Potassium: 4.2 meq/L (ref 3.5–5.1)
Sodium: 137 meq/L (ref 135–145)

## 2024-11-18 LAB — LIPID PANEL
Cholesterol: 180 mg/dL (ref 28–200)
HDL: 38 mg/dL — ABNORMAL LOW
LDL Cholesterol: 115 mg/dL — ABNORMAL HIGH (ref 10–99)
NonHDL: 142.27
Total CHOL/HDL Ratio: 5
Triglycerides: 137 mg/dL (ref 10.0–149.0)
VLDL: 27.4 mg/dL (ref 0.0–40.0)

## 2024-11-18 LAB — MICROALBUMIN / CREATININE URINE RATIO
Creatinine,U: 80.4 mg/dL
Microalb Creat Ratio: 153.5 mg/g — ABNORMAL HIGH (ref 0.0–30.0)
Microalb, Ur: 12.3 mg/dL — ABNORMAL HIGH (ref 0.7–1.9)

## 2024-11-18 MED ORDER — MELATONIN 5 MG PO CAPS: 5.0000 mg | ORAL_CAPSULE | Freq: Every day | ORAL | 0 refills | Status: AC

## 2024-11-18 MED ORDER — TIRZEPATIDE 15 MG/0.5ML ~~LOC~~ SOAJ: 15.0000 mg | SUBCUTANEOUS | 0 refills | Status: AC

## 2024-11-18 NOTE — Patient Instructions (Addendum)
 Thank you for visiting [facility] today! Here's what we talked about: - START new dose of mounjaro  15mg  weekly  - STOP old mounjaro  - ENT will call you. Call their office if you do not hear from them in 2 weeks  - Please ensure peace and quiet for restorative sleep, if needed, sleep in a separate room away from noise. START Melatonin for sleep

## 2024-11-18 NOTE — Progress Notes (Signed)
 "  Subjective:   This visit was conducted in person. The patient gave informed consent to the use of Abridge AI technology to record the contents of the encounter as documented below.   Patient ID: Randall Pace, male    DOB: 1956/10/05, 69 y.o.   MRN: [id number]   Discussed the use of AI scribe software for clinical note transcription with the patient, who gave verbal consent to proceed.  History of Present Illness Randall Pace is a 69 year old male who presents for a transition of care visit.  He has a history of aortic stenosis, monitored annually with a heart ultrasound. His last cardiology follow-up was in September 2025.  He has hypertension and is managed with medication.  He experiences a persistent cough, attributed to allergies, with a sensation of post-nasal drip and congestion. He uses Flonase , two sprays daily, and Zyrtec , which help control the symptoms, though he still experiences a tickle in his throat and coughs when he laughs. He believes these symptoms are related to a previous ENT surgery about ten years ago.  He has sleep apnea but does not use a CPAP machine, having tried it briefly in the past without success. He has difficulty falling and staying asleep, which he attributes to environmental factors such as the TV being on at night. He experiences daytime sleepiness.  He has diabetes, currently on glipizide  and Mounjaro . He reports weight gain from 192 pounds in Randall 2025 to 201 pounds currently. The increase in Mounjaro  dosage did not seem to help with weight loss. His last A1c was 7.2, higher than his previous 6.8.  He has a history of acid reflux, high cholesterol, and arthritis, particularly in his lower back.  He has a history of a mass in the right breast, evaluated in October 2024 and determined to be an enlargement of normal breast tissue. He no longer feels the lump and reports no pain or nipple discharge.  He had a kidney function test in February 2025  showing a filtration rate of 56, below the normal range of greater than 60. He does not regularly use anti-inflammatory medications.     Review of Systems  All other systems reviewed and are negative.       Allergies[1]  Medications Ordered Prior to Encounter[2]  BP 126/60 (BP Location: Left Arm, Patient Position: Sitting, Cuff Size: Large)   Pulse 92   Temp 98.1 F (36.7 C) (Oral)   Ht 5' 6 (1.676 m)   Wt 201 lb (91.2 kg)   SpO2 97%   BMI 32.44 kg/m   Objective:      Physical Exam GENERAL: Alert, cooperative, well developed, no acute distress. HEAD: Normocephalic atraumatic. EYES: Extraocular movements intact bilaterally, pupils round, equal and reactive to light bilaterally, conjunctivae normal bilaterally. EARS: Tympanic membrane, ear canal and external ear normal bilaterally. NOSE: No congestion or rhinorrhea, mucous membranes are moist. THROAT: No oropharyngeal exudate or posterior oropharyngeal erythema. CARDIOVASCULAR: Normal heart rate and rhythm, S1 and S2 normal without murmurs. CHEST: Clear to auscultation bilaterally, no wheezes, rhonchi, or crackles. ABDOMEN: Soft, non tender, non distended, without organomegaly, normal bowel sounds. EXTREMITIES: No cyanosis or edema. NEUROLOGICAL: Oriented to person, place and time, no gait abnormalities, moves all extremities without gross motor or sensory deficit. BREAST: Breasts normal, no masses, no tenderness, no axillary lymphadenopathy.         Assessment & Plan:   Assessment & Plan Chronic cough Chronic cough likely due to allergic rhinitis with improvement on Flonase   and Zyrtec , but persistent post-nasal drip sensation.  - Continue Flonase  and Zyrtec . - Referred to ENT for evaluation of persistent post-nasal drip and further treatment options - Ordered lung function tests to rule out other causes of cough.   Obstructive sleep apnea with insomnia Obstructive sleep apnea with insomnia, likely exacerbated  by untreated sleep apnea and environmental factors such as noise from TV.  Not agreeable to CPAP.  Reports non-restorative sleep and daytime fatigue. - Recommended melatonin, starting with one capsule nightly, increasing to two if ineffective after five days. - Advised sleeping in a separate room to ensure peace and quiet for restorative sleep. - Discussed alternative treatments for sleep apnea with Pulmonary if needed, he would like to hold off on referral for now.   Type 2 diabetes mellitus A1c of 7.2, increased from previous 6.8. Weight gain noted despite current treatment with Mounjaro  and glipizide . Discussed potential side effects of increased Mounjaro  dose, including stomach discomfort, diarrhea, bloating, or nausea. - Increased Mounjaro  to 15 mg weekly. - Scheduled follow-up full diabetic visit in one week. - Urine microalbumin ordered   Decreased GFR Previous GFR of 56, unclear whether this is acute versus chronic as prior GFR from 1 to 2 years ago were WNL. Potential causes include medication use and diabetes. Plan to reassess kidney function to determine if chronic kidney disease is present. - Ordered repeat kidney function tests. - Added urine test to assess for diabetes-related kidney effects.   Hyperlipidemia Hyperlipidemia, currently managed with fenofibrate .  No changes to regimen.  - Ordered cholesterol level test.   Hx of Mass of right breast Was previously evaluated for a lump in the right breast, Mammo in Oct 2024 showed asymmetric retroareolar flame shaped density within the right breast compatible with gynecomastia. No suspicious masses, calcifications or distortion identified within either breast.  Findings were categorized as benign.  Breast exam and axillary lymph nodes are benign today, he has no breast complaints.  Will simply monitor at this time.   Return in about 1 month (around 12/19/2024) for CPE, no fasting labs. Keep upcoming visit on 1/28 for diabetes.    Hasten Sweitzer K Ane Conerly, MD  11/18/24     Contains text generated by Abridge.        [1] No Known Allergies [2]  Current Outpatient Medications on File Prior to Visit  Medication Sig Dispense Refill   AMBULATORY NON FORMULARY MEDICATION Trimix (30/1/50)-(Pap/Phent/PGE)  Test Dose  1ml vial   Qty #3 Refills 0  [facility] [phone] Fax [phone] 3 vial 0   aspirin 81 MG tablet Take 81 mg by mouth daily.     cetirizine  (ZYRTEC ) 10 MG tablet Take 10 mg by mouth daily.     dapagliflozin  propanediol (FARXIGA ) 5 MG TABS tablet TAKE 1 TABLET BY MOUTH EVERY DAY BEFORE BREAKFAST 90 tablet 1   fenofibrate  54 MG tablet TAKE 1 TABLET(54 MG) BY MOUTH DAILY 90 tablet 3   fluticasone  (FLONASE ) 50 MCG/ACT nasal spray Place 2 sprays into both nostrils daily. 16 g 6   gabapentin  (NEURONTIN ) 300 MG capsule Take 300 mg by mouth 2 (two) times daily.     glipiZIDE  (GLUCOTROL ) 10 MG tablet TAKE 1 TABLET (10 MG TOTAL) BY MOUTH TWICE A DAY BEFORE A MEAL 180 tablet 3   omeprazole  (PRILOSEC) 20 MG capsule TAKE 1 CAPSULE BY MOUTH EVERY DAY 90 capsule 3   valsartan -hydrochlorothiazide  (DIOVAN -HCT) 320-25 MG tablet TAKE 1 TABLET BY MOUTH EVERY DAY 90 tablet 3   verapamil  (  VERELAN ) 360 MG 24 hr capsule TAKE 1 CAPSULE (360 MG TOTAL) BY MOUTH AT BEDTIME. 90 capsule 3   No current facility-administered medications on file prior to visit.   "

## 2024-11-18 NOTE — Telephone Encounter (Signed)
 Pls change his 1/28 visit to a diabetic visit not cough. Thanks

## 2024-11-23 ENCOUNTER — Ambulatory Visit: Payer: Self-pay

## 2024-11-23 DIAGNOSIS — N1832 Chronic kidney disease, stage 3b: Secondary | ICD-10-CM | POA: Insufficient documentation

## 2024-11-26 ENCOUNTER — Ambulatory Visit: Payer: Self-pay

## 2024-12-04 ENCOUNTER — Other Ambulatory Visit

## 2024-12-11 ENCOUNTER — Ambulatory Visit: Admitting: Urology

## 2024-12-26 ENCOUNTER — Encounter
